# Patient Record
Sex: Female | Born: 1960 | Race: White | Hispanic: No | Smoking: Former smoker
Health system: Southern US, Community
[De-identification: ages and names within clinical notes are randomized; demographics above are authoritative.]

## PROBLEM LIST (undated history)

## (undated) DIAGNOSIS — E119 Type 2 diabetes mellitus without complications: Secondary | ICD-10-CM

## (undated) DIAGNOSIS — Z8489 Family history of other specified conditions: Secondary | ICD-10-CM

## (undated) DIAGNOSIS — F329 Major depressive disorder, single episode, unspecified: Secondary | ICD-10-CM

## (undated) DIAGNOSIS — N189 Chronic kidney disease, unspecified: Secondary | ICD-10-CM

## (undated) DIAGNOSIS — F32A Depression, unspecified: Secondary | ICD-10-CM

## (undated) DIAGNOSIS — E785 Hyperlipidemia, unspecified: Secondary | ICD-10-CM

## (undated) DIAGNOSIS — R112 Nausea with vomiting, unspecified: Secondary | ICD-10-CM

## (undated) DIAGNOSIS — F419 Anxiety disorder, unspecified: Secondary | ICD-10-CM

## (undated) DIAGNOSIS — K3184 Gastroparesis: Secondary | ICD-10-CM

## (undated) DIAGNOSIS — Z9889 Other specified postprocedural states: Secondary | ICD-10-CM

## (undated) DIAGNOSIS — K219 Gastro-esophageal reflux disease without esophagitis: Secondary | ICD-10-CM

## (undated) DIAGNOSIS — J189 Pneumonia, unspecified organism: Secondary | ICD-10-CM

## (undated) HISTORY — PX: TONSILLECTOMY: SUR1361

## (undated) HISTORY — DX: Gastro-esophageal reflux disease without esophagitis: K21.9

## (undated) HISTORY — PX: TYMPANOSTOMY TUBE PLACEMENT: SHX32

## (undated) HISTORY — PX: ADENOIDECTOMY: SUR15

## (undated) HISTORY — PX: TUBAL LIGATION: SHX77

## (undated) HISTORY — PX: CHOLECYSTECTOMY: SHX55

---

## 2007-07-01 ENCOUNTER — Other Ambulatory Visit: Admission: RE | Admit: 2007-07-01 | Discharge: 2007-07-01 | Payer: Self-pay | Admitting: Obstetrics and Gynecology

## 2007-07-05 ENCOUNTER — Ambulatory Visit (HOSPITAL_COMMUNITY): Admission: RE | Admit: 2007-07-05 | Discharge: 2007-07-05 | Payer: Self-pay | Admitting: Obstetrics and Gynecology

## 2007-07-26 ENCOUNTER — Other Ambulatory Visit: Admission: RE | Admit: 2007-07-26 | Discharge: 2007-07-26 | Payer: Self-pay | Admitting: Obstetrics & Gynecology

## 2008-10-25 ENCOUNTER — Other Ambulatory Visit: Admission: RE | Admit: 2008-10-25 | Discharge: 2008-10-25 | Payer: Self-pay | Admitting: Obstetrics and Gynecology

## 2009-02-05 ENCOUNTER — Ambulatory Visit (HOSPITAL_COMMUNITY): Admission: RE | Admit: 2009-02-05 | Discharge: 2009-02-05 | Payer: Self-pay | Admitting: Family Medicine

## 2012-03-03 ENCOUNTER — Other Ambulatory Visit (HOSPITAL_COMMUNITY)
Admission: RE | Admit: 2012-03-03 | Discharge: 2012-03-03 | Disposition: A | Discharge status: Home / Self Care | Payer: PRIVATE HEALTH INSURANCE | Source: Ambulatory Visit | Attending: Obstetrics and Gynecology | Admitting: Obstetrics and Gynecology

## 2012-03-03 DIAGNOSIS — Z01419 Encounter for gynecological examination (general) (routine) without abnormal findings: Secondary | ICD-10-CM | POA: Insufficient documentation

## 2012-03-03 DIAGNOSIS — Z113 Encounter for screening for infections with a predominantly sexual mode of transmission: Secondary | ICD-10-CM | POA: Insufficient documentation

## 2012-03-03 DIAGNOSIS — Z1159 Encounter for screening for other viral diseases: Secondary | ICD-10-CM | POA: Insufficient documentation

## 2012-03-05 ENCOUNTER — Other Ambulatory Visit: Payer: Self-pay | Admitting: Adult Health

## 2012-03-05 DIAGNOSIS — Z139 Encounter for screening, unspecified: Secondary | ICD-10-CM

## 2012-03-08 ENCOUNTER — Ambulatory Visit (HOSPITAL_COMMUNITY)
Admission: RE | Admit: 2012-03-08 | Discharge: 2012-03-08 | Disposition: A | Discharge status: Home / Self Care | Payer: PRIVATE HEALTH INSURANCE | Source: Ambulatory Visit | Attending: Adult Health | Admitting: Adult Health

## 2012-03-08 DIAGNOSIS — Z139 Encounter for screening, unspecified: Secondary | ICD-10-CM

## 2012-03-08 DIAGNOSIS — Z1231 Encounter for screening mammogram for malignant neoplasm of breast: Secondary | ICD-10-CM | POA: Insufficient documentation

## 2013-01-10 ENCOUNTER — Other Ambulatory Visit: Payer: Self-pay | Admitting: Adult Health

## 2013-03-17 ENCOUNTER — Other Ambulatory Visit: Payer: Self-pay | Admitting: Adult Health

## 2013-04-10 ENCOUNTER — Other Ambulatory Visit: Payer: Self-pay | Admitting: Obstetrics and Gynecology

## 2013-05-24 ENCOUNTER — Other Ambulatory Visit: Payer: Self-pay | Admitting: Adult Health

## 2013-07-19 ENCOUNTER — Other Ambulatory Visit: Payer: Self-pay | Admitting: Adult Health

## 2013-09-19 ENCOUNTER — Other Ambulatory Visit: Payer: Self-pay | Admitting: Adult Health

## 2015-08-08 ENCOUNTER — Ambulatory Visit (HOSPITAL_COMMUNITY)
Admission: RE | Admit: 2015-08-08 | Discharge: 2015-08-08 | Disposition: A | Discharge status: Home / Self Care | Payer: PRIVATE HEALTH INSURANCE | Source: Ambulatory Visit | Attending: Family Medicine | Admitting: Family Medicine

## 2015-08-08 ENCOUNTER — Other Ambulatory Visit (HOSPITAL_COMMUNITY): Payer: Self-pay | Admitting: Family Medicine

## 2015-08-08 DIAGNOSIS — M79641 Pain in right hand: Secondary | ICD-10-CM | POA: Diagnosis not present

## 2016-09-29 ENCOUNTER — Other Ambulatory Visit (HOSPITAL_COMMUNITY): Payer: Self-pay | Admitting: Internal Medicine

## 2016-09-29 ENCOUNTER — Ambulatory Visit (HOSPITAL_COMMUNITY)
Admission: RE | Admit: 2016-09-29 | Discharge: 2016-09-29 | Disposition: A | Discharge status: Home / Self Care | Payer: PRIVATE HEALTH INSURANCE | Source: Ambulatory Visit | Attending: Internal Medicine | Admitting: Internal Medicine

## 2016-09-29 DIAGNOSIS — R0602 Shortness of breath: Secondary | ICD-10-CM

## 2016-09-29 DIAGNOSIS — R05 Cough: Secondary | ICD-10-CM | POA: Insufficient documentation

## 2016-09-29 DIAGNOSIS — R918 Other nonspecific abnormal finding of lung field: Secondary | ICD-10-CM | POA: Diagnosis not present

## 2016-09-29 DIAGNOSIS — R059 Cough, unspecified: Secondary | ICD-10-CM

## 2016-10-01 ENCOUNTER — Observation Stay (HOSPITAL_COMMUNITY)
Admission: EM | Admit: 2016-10-01 | Discharge: 2016-10-02 | Disposition: A | Discharge status: Home / Self Care | Payer: PRIVATE HEALTH INSURANCE | Attending: Internal Medicine | Admitting: Internal Medicine

## 2016-10-01 ENCOUNTER — Emergency Department (HOSPITAL_COMMUNITY): Payer: PRIVATE HEALTH INSURANCE

## 2016-10-01 ENCOUNTER — Encounter (HOSPITAL_COMMUNITY): Payer: Self-pay | Admitting: *Deleted

## 2016-10-01 DIAGNOSIS — E876 Hypokalemia: Secondary | ICD-10-CM | POA: Diagnosis not present

## 2016-10-01 DIAGNOSIS — J189 Pneumonia, unspecified organism: Principal | ICD-10-CM | POA: Insufficient documentation

## 2016-10-01 DIAGNOSIS — F17200 Nicotine dependence, unspecified, uncomplicated: Secondary | ICD-10-CM | POA: Insufficient documentation

## 2016-10-01 DIAGNOSIS — Z79899 Other long term (current) drug therapy: Secondary | ICD-10-CM | POA: Insufficient documentation

## 2016-10-01 DIAGNOSIS — I10 Essential (primary) hypertension: Secondary | ICD-10-CM | POA: Insufficient documentation

## 2016-10-01 DIAGNOSIS — R0602 Shortness of breath: Secondary | ICD-10-CM | POA: Diagnosis present

## 2016-10-01 DIAGNOSIS — Z7984 Long term (current) use of oral hypoglycemic drugs: Secondary | ICD-10-CM | POA: Insufficient documentation

## 2016-10-01 DIAGNOSIS — J9601 Acute respiratory failure with hypoxia: Secondary | ICD-10-CM | POA: Diagnosis not present

## 2016-10-01 DIAGNOSIS — J111 Influenza due to unidentified influenza virus with other respiratory manifestations: Secondary | ICD-10-CM | POA: Diagnosis present

## 2016-10-01 DIAGNOSIS — J181 Lobar pneumonia, unspecified organism: Secondary | ICD-10-CM | POA: Diagnosis not present

## 2016-10-01 HISTORY — DX: Anxiety disorder, unspecified: F41.9

## 2016-10-01 HISTORY — DX: Hyperlipidemia, unspecified: E78.5

## 2016-10-01 LAB — CBC WITH DIFFERENTIAL/PLATELET
BASOS ABS: 0 10*3/uL (ref 0.0–0.1)
Basophils Relative: 0 %
EOS PCT: 1 %
Eosinophils Absolute: 0.1 10*3/uL (ref 0.0–0.7)
HEMATOCRIT: 34.9 % — AB (ref 36.0–46.0)
HEMOGLOBIN: 11.7 g/dL — AB (ref 12.0–15.0)
LYMPHS ABS: 1.7 10*3/uL (ref 0.7–4.0)
LYMPHS PCT: 26 %
MCH: 29.2 pg (ref 26.0–34.0)
MCHC: 33.5 g/dL (ref 30.0–36.0)
MCV: 87 fL (ref 78.0–100.0)
Monocytes Absolute: 0.3 10*3/uL (ref 0.1–1.0)
Monocytes Relative: 5 %
NEUTROS ABS: 4.5 10*3/uL (ref 1.7–7.7)
NEUTROS PCT: 68 %
PLATELETS: 208 10*3/uL (ref 150–400)
RBC: 4.01 MIL/uL (ref 3.87–5.11)
RDW: 13 % (ref 11.5–15.5)
WBC: 6.6 10*3/uL (ref 4.0–10.5)

## 2016-10-01 LAB — COMPREHENSIVE METABOLIC PANEL
ALK PHOS: 112 U/L (ref 38–126)
ALT: 31 U/L (ref 14–54)
AST: 41 U/L (ref 15–41)
Albumin: 3.3 g/dL — ABNORMAL LOW (ref 3.5–5.0)
Anion gap: 11 (ref 5–15)
BUN: 9 mg/dL (ref 6–20)
CALCIUM: 8.6 mg/dL — AB (ref 8.9–10.3)
CHLORIDE: 95 mmol/L — AB (ref 101–111)
CO2: 30 mmol/L (ref 22–32)
CREATININE: 0.55 mg/dL (ref 0.44–1.00)
GFR calc Af Amer: >60 mL/min (ref >60)
Glucose, Bld: 247 mg/dL — ABNORMAL HIGH (ref 65–99)
Potassium: 3.1 mmol/L — ABNORMAL LOW (ref 3.5–5.1)
Sodium: 136 mmol/L (ref 135–145)
Total Bilirubin: 0.5 mg/dL (ref 0.3–1.2)
Total Protein: 7 g/dL (ref 6.5–8.1)

## 2016-10-01 LAB — BRAIN NATRIURETIC PEPTIDE: B Natriuretic Peptide: 71 pg/mL (ref 0.0–100.0)

## 2016-10-01 LAB — INFLUENZA PANEL BY PCR (TYPE A & B)
INFLAPCR: POSITIVE — AB
INFLBPCR: NEGATIVE

## 2016-10-01 LAB — GLUCOSE, CAPILLARY: Glucose-Capillary: 309 mg/dL — ABNORMAL HIGH (ref 65–99)

## 2016-10-01 LAB — TROPONIN I

## 2016-10-01 MED ORDER — ALBUTEROL SULFATE (2.5 MG/3ML) 0.083% IN NEBU
5.0000 mg | INHALATION_SOLUTION | Freq: Once | RESPIRATORY_TRACT | Status: AC
  Administered 2016-10-01: 5 mg via RESPIRATORY_TRACT

## 2016-10-01 MED ORDER — LEVOFLOXACIN IN D5W 500 MG/100ML IV SOLN: 500.0000 mg | Freq: Once | INTRAVENOUS | Status: DC

## 2016-10-01 MED ORDER — LEVOFLOXACIN IN D5W 750 MG/150ML IV SOLN
750.0000 mg | INTRAVENOUS | Status: DC
  Administered 2016-10-01: 750 mg via INTRAVENOUS
  Filled 2016-10-01: qty 150

## 2016-10-01 MED ORDER — ALBUTEROL SULFATE (2.5 MG/3ML) 0.083% IN NEBU
2.5000 mg | INHALATION_SOLUTION | Freq: Once | RESPIRATORY_TRACT | Status: DC
  Filled 2016-10-01: qty 3

## 2016-10-01 MED ORDER — INSULIN ASPART 100 UNIT/ML ~~LOC~~ SOLN
0.0000 [IU] | Freq: Three times a day (TID) | SUBCUTANEOUS | Status: DC
  Administered 2016-10-02: 5 [IU] via SUBCUTANEOUS

## 2016-10-01 MED ORDER — ONDANSETRON HCL 4 MG/2ML IJ SOLN: 4.0000 mg | Freq: Four times a day (QID) | INTRAMUSCULAR | Status: DC | PRN

## 2016-10-01 MED ORDER — OSELTAMIVIR PHOSPHATE 75 MG PO CAPS
75.0000 mg | ORAL_CAPSULE | Freq: Two times a day (BID) | ORAL | Status: DC
  Administered 2016-10-01 – 2016-10-02 (×3): 75 mg via ORAL
  Filled 2016-10-01 (×3): qty 1

## 2016-10-01 MED ORDER — SODIUM CHLORIDE 0.9 % IV BOLUS (SEPSIS): 1000.0000 mL | Freq: Once | INTRAVENOUS | Status: DC

## 2016-10-01 MED ORDER — ONDANSETRON HCL 4 MG PO TABS
4.0000 mg | ORAL_TABLET | Freq: Four times a day (QID) | ORAL | Status: DC | PRN
Start: 2016-10-01 — End: 2016-10-02

## 2016-10-01 MED ORDER — LISINOPRIL 5 MG PO TABS
2.5000 mg | ORAL_TABLET | Freq: Every day | ORAL | Status: DC
  Administered 2016-10-01 – 2016-10-02 (×2): 2.5 mg via ORAL
  Filled 2016-10-01 (×2): qty 1

## 2016-10-01 MED ORDER — ALBUTEROL SULFATE (2.5 MG/3ML) 0.083% IN NEBU: 2.5000 mg | INHALATION_SOLUTION | RESPIRATORY_TRACT | Status: DC | PRN

## 2016-10-01 MED ORDER — POTASSIUM CHLORIDE CRYS ER 20 MEQ PO TBCR
40.0000 meq | EXTENDED_RELEASE_TABLET | Freq: Once | ORAL | Status: AC
  Administered 2016-10-01: 40 meq via ORAL
  Filled 2016-10-01: qty 2

## 2016-10-01 MED ORDER — IPRATROPIUM-ALBUTEROL 0.5-2.5 (3) MG/3ML IN SOLN
3.0000 mL | Freq: Once | RESPIRATORY_TRACT | Status: AC
  Administered 2016-10-01: 3 mL via RESPIRATORY_TRACT
  Filled 2016-10-01: qty 3

## 2016-10-01 MED ORDER — POTASSIUM CHLORIDE CRYS ER 20 MEQ PO TBCR
40.0000 meq | EXTENDED_RELEASE_TABLET | Freq: Four times a day (QID) | ORAL | Status: DC
  Filled 2016-10-01: qty 2

## 2016-10-01 MED ORDER — INSULIN ASPART 100 UNIT/ML ~~LOC~~ SOLN
0.0000 [IU] | Freq: Every day | SUBCUTANEOUS | Status: DC
  Administered 2016-10-01: 4 [IU] via SUBCUTANEOUS

## 2016-10-01 MED ORDER — GLIMEPIRIDE 2 MG PO TABS
4.0000 mg | ORAL_TABLET | Freq: Every day | ORAL | Status: DC
  Administered 2016-10-02: 4 mg via ORAL
  Filled 2016-10-01: qty 2

## 2016-10-01 MED ORDER — IBUPROFEN 400 MG PO TABS: 400.0000 mg | ORAL_TABLET | Freq: Four times a day (QID) | ORAL | Status: DC | PRN

## 2016-10-01 MED ORDER — ENOXAPARIN SODIUM 60 MG/0.6ML ~~LOC~~ SOLN
60.0000 mg | SUBCUTANEOUS | Status: DC
  Filled 2016-10-01: qty 0.6

## 2016-10-01 MED ORDER — POTASSIUM CHLORIDE IN NACL 20-0.9 MEQ/L-% IV SOLN
INTRAVENOUS | Status: DC
  Administered 2016-10-01 – 2016-10-02 (×2): via INTRAVENOUS

## 2016-10-01 MED ORDER — HYDROCODONE-ACETAMINOPHEN 5-325 MG PO TABS: 1.0000 | ORAL_TABLET | ORAL | Status: DC | PRN

## 2016-10-01 MED ORDER — FLUOXETINE HCL 20 MG PO CAPS
20.0000 mg | ORAL_CAPSULE | Freq: Every day | ORAL | Status: DC
  Administered 2016-10-01 – 2016-10-02 (×2): 20 mg via ORAL
  Filled 2016-10-01 (×2): qty 1

## 2016-10-01 MED ORDER — ATORVASTATIN CALCIUM 10 MG PO TABS
10.0000 mg | ORAL_TABLET | Freq: Every day | ORAL | Status: DC
  Administered 2016-10-01: 10 mg via ORAL
  Filled 2016-10-01: qty 1

## 2016-10-01 NOTE — ED Notes (Signed)
Pt taken off oxygen to assess. Decrease in Oxygen to 88%

## 2016-10-01 NOTE — ED Provider Notes (Signed)
[facility] DEPT Provider Note   CSN: [id number] Arrival date & time: 10/01/16  1120   By signing my name below, I, Jeanette Aguirre, attest that this documentation has been prepared under the direction and in the presence of Milton Ferguson, MD. Electronically Signed: Collene Aguirre, Scribe. 10/01/16. 12:56 PM.  History   Chief Complaint Chief Complaint  Patient presents with  . Shortness of Breath    HPI Comments: Jeanette Aguirre is a 56 y.o. female who presents to the Emergency Department complaining of a intermittent  cough that began 6 days ago. Patient has associated fever, chills, and shortness of breath. Patient states she went to her PCP  3 days ago and had a breathing treatment. Patients chest x-ray showed pneumonia and she was referred to the hospital. Patient is a chronic tobacco user, one pack a day. She denies any vomiting.   The history is provided by the patient. No language interpreter was used.  Shortness of Breath  This is a new problem. The problem occurs rarely.The current episode started more than 2 days ago. The problem has not changed since onset.Associated symptoms include a fever and cough. Pertinent negatives include no headaches, no chest pain, no vomiting, no abdominal pain and no rash. Risk factors include smoking.    Past Medical History:  Diagnosis Date  . Anxiety   . Hyperlipemia   . Hypertension     There are no active problems to display for this patient.   Past Surgical History:  Procedure Laterality Date  . CHOLECYSTECTOMY    . TONSILLECTOMY    . TYMPANOSTOMY TUBE PLACEMENT      OB History    No data available       Home Medications    Prior to Admission medications   Medication Sig Start Date End Date Taking? Authorizing Provider  FLUoxetine (PROZAC) 20 MG capsule TAKE ONE CAPSULE BY MOUTH EVERY DAY 04/10/13   Jonnie Kind, MD  megestrol (MEGACE) 40 MG tablet TAKE ONE TABLET BY MOUTH ONCE DAILY 07/19/13   Estill Dooms, NP     Family History No family history on file.  Social History Social History  Substance Use Topics  . Smoking status: Current Some Day Smoker  . Smokeless tobacco: Never Used  . Alcohol use Yes     Comment: occ.     Allergies   Patient has no known allergies.   Review of Systems Review of Systems  Constitutional: Positive for fever. Negative for appetite change and fatigue.  HENT: Negative for congestion, ear discharge and sinus pressure.   Eyes: Negative for discharge.  Respiratory: Positive for cough and shortness of breath.   Cardiovascular: Negative for chest pain.  Gastrointestinal: Negative for abdominal pain, diarrhea and vomiting.  Genitourinary: Negative for frequency and hematuria.  Musculoskeletal: Negative for back pain.  Skin: Negative for rash.  Neurological: Negative for seizures and headaches.  Psychiatric/Behavioral: Negative for hallucinations.     Physical Exam Updated Vital Signs BP 116/64 (BP Location: Right Arm)   Pulse 73   Temp 99.5 F (37.5 C) (Oral)   Resp 20   Ht 5\' 6"  (1.676 m)   Wt 256 lb (116.1 kg)   LMP 03/03/2012   SpO2 (S) 94% Comment: Pt was 88% on RA.  BMI 41.32 kg/m   Physical Exam  Constitutional: She is oriented to person, place, and time. She appears well-developed.  HENT:  Head: Normocephalic.  Eyes: Conjunctivae and EOM are normal. No scleral icterus.  Neck:  Neck supple. No thyromegaly present.  Cardiovascular: Normal rate and regular rhythm.  Exam reveals no gallop and no friction rub.   No murmur heard. Moderate wheezing bilaterally.   Pulmonary/Chest: No stridor. She has wheezes. She has no rales. She exhibits no tenderness.  Abdominal: She exhibits no distension. There is no tenderness. There is no rebound.  Musculoskeletal: Normal range of motion. She exhibits no edema.  Lymphadenopathy:    She has no cervical adenopathy.  Neurological: She is oriented to person, place, and time. She exhibits normal muscle  tone. Coordination normal.  Skin: No rash noted. No erythema.  Psychiatric: She has a normal mood and affect. Her behavior is normal.     ED Treatments / Results  DIAGNOSTIC STUDIES: Oxygen Saturation is 88% on RA, low by my interpretation.    COORDINATION OF CARE: 12:01 PM Discussed treatment plan with pt at bedside and pt agreed to plan.  Labs (all labs ordered are listed, but only abnormal results are displayed) Labs Reviewed - No data to display  EKG  EKG Interpretation None       Radiology No results found.  Procedures Procedures (including critical care time)  Medications Ordered in ED Medications  albuterol (PROVENTIL) (2.5 MG/3ML) 0.083% nebulizer solution 5 mg (not administered)     Initial Impression / Assessment and Plan / ED Course  I have reviewed the triage vital signs and the nursing notes.  Pertinent labs & imaging results that were available during my care of the patient were reviewed by me and considered in my medical decision making (see chart for details).  Clinical Course     Patient with pneumonia. She'll be admitted to medicine  Final Clinical Impressions(s) / ED Diagnoses   Final diagnoses:  None    New Prescriptions New Prescriptions   No medications on file   The chart was scribed for me under my direct supervision.  I personally performed the history, physical, and medical decision making and all procedures in the evaluation of this patient.Milton Ferguson, MD 10/01/16 [phone]

## 2016-10-01 NOTE — ED Triage Notes (Signed)
Pt started having cough and congestion since last Friday. Pt went to her PCP on Monday and had a breathing tx and chest xray which showed pneumonia. Pt was discharged with cough medication and two antibiotics. Pt states she had a follow up today and continued to have shortness of breath with a oxygen of 83%. Pt was given another breathing tx. NAD noted in triage.

## 2016-10-01 NOTE — H&P (Signed)
[facility] H&P   Patient Demographics:    Jeanette Aguirre, is a 56 y.o. female  MRN: [id number]   DOB - 05-Oct-1960  Admit Date - 10/01/2016  Outpatient Primary MD for the patient is Purvis Kilts, MD Patient coming from: Home  Chief Complaint  Patient presents with  . Shortness of Breath      HPI:    Jeanette Aguirre  is a 56 y.o. female, DM type II, hypertension, ongoing smoking, obesity comes to the hospital with three-day history of dry cough, shortness of breath and fevers, did not take a flu shot this year, denies any exposure to sick contacts. In the ER has with pneumonia with infiltrate on x-ray, she was febrile but without any white count, she was found to be hypoxic requiring oxygen and I was called to admit.  He should currently denies any headache, low intermittent fevers at home, no chest pain, positive dry cough and shortness of breath, some abdominal pain from constant coughing, no diarrhea, no blood in stool or urine, no swelling or edema in legs, no focal weakness.    Review of systems:    In addition to the HPI above,   No Fever-chills, No Headache, No changes with Vision or hearing, No problems swallowing food or Liquids, No Chest pain, As above Cough & Shortness of Breath, No Abdominal pain, No Nausea or Vommitting, Bowel movements are regular, No Blood in stool or Urine, No dysuria, No new skin rashes or bruises, No new joints pains-aches,  No new weakness, tingling, numbness in any extremity, No recent weight gain or loss, No polyuria, polydypsia or polyphagia, No significant Mental Stressors.  A full 10 point Review of Systems was done, except as stated above, all other Review of Systems were  negative.   With Past History of the following :    Past Medical History:  Diagnosis Date  . Anxiety   . Hyperlipemia   . Hypertension       Past Surgical History:  Procedure Laterality Date  . CHOLECYSTECTOMY    . TONSILLECTOMY    . TYMPANOSTOMY TUBE PLACEMENT        Social History:     Social History  Substance Use Topics  . Smoking status: Current Some Day Smoker  . Smokeless tobacco: Never Used  . Alcohol use Yes  Comment: occ.         Family History :   Abely history positive for DM type II and patient's parents   Home Medications:   Prior to Admission medications   Medication Sig Start Date End Date Taking? Authorizing Provider  acetaminophen (TYLENOL) 500 MG tablet Take 1,000 mg by mouth every 6 (six) hours as needed.   Yes Historical Provider, MD  atorvastatin (LIPITOR) 10 MG tablet Take 10 mg by mouth at bedtime.   Yes Historical Provider, MD  azithromycin (ZITHROMAX) 250 MG tablet Take 250 mg by mouth as directed.   Yes Historical Provider, MD  benzonatate (TESSALON) 100 MG capsule Take 100 mg by mouth 3 (three) times daily as needed for cough.   Yes Historical Provider, MD  Chlorphen-Phenyleph-ASA (ALKA-SELTZER PLUS COLD) 2-7.8-325 MG TBEF Take 1 tablet by mouth daily as needed (cold symptoms).   Yes Historical Provider, MD  doxycycline (VIBRAMYCIN) 100 MG capsule Take 100 mg by mouth 2 (two) times daily. For 10 days 09/29/16  Yes Historical Provider, MD  FLUoxetine (PROZAC) 20 MG capsule TAKE ONE CAPSULE BY MOUTH EVERY DAY 04/10/13  Yes Jonnie Kind, MD  glimepiride (AMARYL) 4 MG tablet Take 4 mg by mouth daily with breakfast.   Yes Historical Provider, MD  ibuprofen (ADVIL,MOTRIN) 200 MG tablet Take 400 mg by mouth every 6 (six) hours as needed.   Yes Historical Provider, MD  lisinopril (PRINIVIL,ZESTRIL) 2.5 MG tablet Take 2.5 mg by mouth daily.   Yes Historical Provider, MD  naproxen sodium (ANAPROX) 220 MG tablet Take 220 mg by mouth 2 (two)  times daily with a meal.   Yes Historical Provider, MD  megestrol (MEGACE) 40 MG tablet TAKE ONE TABLET BY MOUTH ONCE DAILY Patient not taking: Reported on 10/01/2016 07/19/13   Estill Dooms, NP     Allergies:    No Known Allergies   Physical Exam:   Vitals  Blood pressure 127/58, pulse 79, temperature 99.5 F (37.5 C), temperature source Oral, resp. rate 18, height 5\' 6"  (1.676 m), weight 116.1 kg (256 lb), last menstrual period 03/03/2012, SpO2 91 %.   1. General Middle-aged obese white female sitting in bed in NAD,     2. Normal affect and insight, Not Suicidal or Homicidal, Awake Alert, Oriented X 3.  3. No F.N deficits, ALL C.Nerves Intact, Strength 5/5 all 4 extremities, Sensation intact all 4 extremities, Plantars down going.  4. Ears and Eyes appear Normal, Conjunctivae clear, PERRLA. Moist Oral Mucosa.  5. Supple Neck, No JVD, No cervical lymphadenopathy appriciated, No Carotid Bruits.  6. Symmetrical Chest Faddis movement, Good air movement bilaterally, few bilateral rales  7. RRR, No Gallops, Rubs or Murmurs, No Parasternal Heave.  8. Positive Bowel Sounds, Abdomen Soft, No tenderness, No organomegaly appriciated,No rebound -guarding or rigidity.  9.  No Cyanosis, Normal Skin Turgor, No Skin Rash or Bruise.  10. Good muscle tone,  joints appear normal , no effusions, Normal ROM.  11. No Palpable Lymph Nodes in Neck or Axillae      Data Review:    CBC  Recent Labs Lab 10/01/16 1224  WBC 6.6  HGB 11.7*  HCT 34.9*  PLT 208  MCV 87.0  MCH 29.2  MCHC 33.5  RDW 13.0  LYMPHSABS 1.7  MONOABS 0.3  EOSABS 0.1  BASOSABS 0.0   ------------------------------------------------------------------------------------------------------------------  Chemistries   Recent Labs Lab 10/01/16 1224  NA 136  K 3.1*  CL 95*  CO2 30  GLUCOSE 247*  BUN 9  CREATININE 0.55  CALCIUM 8.6*  AST 41  ALT 31  ALKPHOS 112  BILITOT 0.5    ------------------------------------------------------------------------------------------------------------------ estimated creatinine clearance is 105.3 mL/min (by C-G formula based on SCr of 0.55 mg/dL). ------------------------------------------------------------------------------------------------------------------ No results for input(s): TSH, T4TOTAL, T3FREE, THYROIDAB in the last 72 hours.  Invalid input(s): FREET3  Coagulation profile No results for input(s): INR, PROTIME in the last 168 hours. ------------------------------------------------------------------------------------------------------------------- No results for input(s): DDIMER in the last 72 hours. -------------------------------------------------------------------------------------------------------------------  Cardiac Enzymes  Recent Labs Lab 10/01/16 1224  TROPONINI <0.03   ------------------------------------------------------------------------------------------------------------------    Component Value Date/Time   BNP 71.0 10/01/2016 1224     ---------------------------------------------------------------------------------------------------------------  Urinalysis No results found for: COLORURINE, APPEARANCEUR, LABSPEC, Gordon, GLUCOSEU, HGBUR, BILIRUBINUR, KETONESUR, PROTEINUR, UROBILINOGEN, NITRITE, LEUKOCYTESUR  ----------------------------------------------------------------------------------------------------------------   Imaging Results:    Dg Chest 2 View  Result Date: 10/01/2016 CLINICAL DATA:  Shortness of breath since this past Monday with increasing severity. The patient reports a cough for the past 5 days. Current smoker. History of hypertension. EXAM: CHEST  2 VIEW COMPARISON:  PA and lateral chest x-ray of September 29, 2016 FINDINGS: The lungs are well-expanded. The interstitial markings remain increased. Confluent density has developed medially in the left upper lobe. The cardiac  silhouette is normal in size. The pulmonary vascularity is not engorged. There is calcification in the Ratcliffe of the aortic arch. There is no pleural effusion. The bony thorax exhibits no acute abnormality. IMPRESSION: Chronic bronchitic changes with superimposed acute interstitial pneumonia (or less likely edema) with an area of alveolar pneumonia developing in the right upper lobe. Thoracic aortic atherosclerosis. Electronically Signed   By: David  Martinique M.D.   On: 10/01/2016 12:38    My personal review of EKG: Rhythm NSR,  no Acute ST changes   Assessment & Plan:     1. Acute hypoxic respiratory failure due to CAP - suspicious for influenza, we will check influenza PCR for now place her on Tamiflu along with Levaquin, oxygen and nebulizer treatments as needed. We'll reevaluate oxygen requirement in the morning.  2. Smoking. Counseled to quit.  3. Hypertension. Continue home dose ACE inhibitor.  4. Hypokalemia. Replaced.   5. DM type II. Check A1c, continue Amaryl, add sliding scale.     DVT Prophylaxis   Lovenox    AM Labs Ordered, also please review Full Orders  Family Communication: Admission, patients condition and plan of care including tests being ordered have been discussed with the patient  who indicates understanding and agree with the plan and Code Status.  Code Status Full  Likely DC to  Home 2-3 days  Condition Fair  Consults called: None    Admission status: Inpt    Time spent in minutes : 35   Javone Ybanez K M.D on 10/01/2016 at 2:54 PM  Between 7am to 7pm - Pager - [phone]. After 7pm go to [email or web address] - password [facility]  [facility] - Office  [phone]

## 2016-10-01 NOTE — Progress Notes (Signed)
Pharmacy Antibiotic Note  LORRA DOWNING is a 56 y.o. female admitted on 10/01/2016 with CAP.  Pharmacy has been consulted for [facility] dosing.  Plan: Levaquin 750mg  IV q24hrs x 5 days, switch to PO when improved Monitor labs, progress, c/s  Height: 5\' 6"  (167.6 cm) Weight: 256 lb (116.1 kg) IBW/kg (Calculated) : 59.3  Temp (24hrs), Avg:99.5 F (37.5 C), Min:99.5 F (37.5 C), Max:99.5 F (37.5 C)   Recent Labs Lab 10/01/16 1224  WBC 6.6  CREATININE 0.55    Estimated Creatinine Clearance: 105.3 mL/min (by C-G formula based on SCr of 0.55 mg/dL).    No Known Allergies  Antimicrobials this admission: Levaquin 1/10 >>   Dose adjustments this admission: n/a  Thank you for allowing pharmacy to be a part of this patient's care.  Hart Robinsons A 10/01/2016 2:20 PM

## 2016-10-01 NOTE — ED Notes (Signed)
RT paged about breathing tx. 

## 2016-10-02 DIAGNOSIS — J181 Lobar pneumonia, unspecified organism: Secondary | ICD-10-CM | POA: Diagnosis not present

## 2016-10-02 DIAGNOSIS — J111 Influenza due to unidentified influenza virus with other respiratory manifestations: Secondary | ICD-10-CM | POA: Diagnosis present

## 2016-10-02 LAB — BASIC METABOLIC PANEL
ANION GAP: 8 (ref 5–15)
BUN: 9 mg/dL (ref 6–20)
CALCIUM: 8.3 mg/dL — AB (ref 8.9–10.3)
CO2: 31 mmol/L (ref 22–32)
CREATININE: 0.48 mg/dL (ref 0.44–1.00)
Chloride: 99 mmol/L — ABNORMAL LOW (ref 101–111)
GFR calc Af Amer: >60 mL/min (ref >60)
Glucose, Bld: 245 mg/dL — ABNORMAL HIGH (ref 65–99)
Potassium: 3.7 mmol/L (ref 3.5–5.1)
Sodium: 138 mmol/L (ref 135–145)

## 2016-10-02 LAB — CBC
HCT: 34.1 % — ABNORMAL LOW (ref 36.0–46.0)
HEMOGLOBIN: 11.2 g/dL — AB (ref 12.0–15.0)
MCH: 28.9 pg (ref 26.0–34.0)
MCHC: 32.8 g/dL (ref 30.0–36.0)
MCV: 87.9 fL (ref 78.0–100.0)
Platelets: 196 10*3/uL (ref 150–400)
RBC: 3.88 MIL/uL (ref 3.87–5.11)
RDW: 13.3 % (ref 11.5–15.5)
WBC: 5.8 10*3/uL (ref 4.0–10.5)

## 2016-10-02 LAB — GLUCOSE, CAPILLARY: GLUCOSE-CAPILLARY: 291 mg/dL — AB (ref 65–99)

## 2016-10-02 LAB — HEMOGLOBIN A1C
Hgb A1c MFr Bld: 9.5 % — ABNORMAL HIGH (ref 4.8–5.6)
Mean Plasma Glucose: 226 mg/dL

## 2016-10-02 MED ORDER — OSELTAMIVIR PHOSPHATE 75 MG PO CAPS: 75.0000 mg | ORAL_CAPSULE | Freq: Two times a day (BID) | ORAL | 0 refills | Status: DC

## 2016-10-02 MED ORDER — DM-GUAIFENESIN ER 30-600 MG PO TB12: 1.0000 | ORAL_TABLET | Freq: Two times a day (BID) | ORAL | 0 refills | Status: DC

## 2016-10-02 NOTE — Discharge Summary (Addendum)
Jeanette Aguirre [id number] DOB: 10-08-1960 DOA: 10/01/2016  PCP: Purvis Kilts, MD  Admit date: 10/01/2016  Discharge date: 10/02/2016  Admitted From: Home  Disposition:  Home   Recommendations for Outpatient Follow-up:   Follow up with PCP in 1-2 weeks  PCP Please obtain BMP/CBC, 2 view CXR in 1week,  (see Discharge instructions)   PCP Please follow up on the following pending results: A1c   Home Health: None   Equipment/Devices: o2  Consultations: None Discharge Condition: Stable   CODE STATUS: Full   Diet Recommendation: Diet heart healthy/carb modified    Chief Complaint  Patient presents with  . Shortness of Breath     Brief history of present illness from the day of admission and additional interim summary     Jeanette Aguirre  is a 56 y.o. female, DM type II, hypertension, ongoing smoking, obesity comes to the hospital with three-day history of dry cough, shortness of breath and fevers, did not take a flu shot this year, denies any exposure to sick contacts. In the ER has with pneumonia with infiltrate on x-ray, she was febrile but without any white count, she was found to be hypoxic requiring oxygen and I was called to admit.  He should currently denies any headache, low intermittent fevers at home, no chest pain, positive dry cough and shortness of breath, some abdominal pain from constant coughing, no diarrhea, no blood in stool or urine, no swelling or edema in legs, no focal weakness.  Hospital issues addressed    1. Acute hypoxic respiratory failure due to Flu induced CAP - Feels better, started on Tamiflu, still mildly hypoxic upon ambulation and will get oxygen 2 L nasal cannula for now, complete Tamiflu course, follow with PCP in a week likely can come off of oxygen soon. No leukocytosis do not  think she has a bacterial component to her infection.  2. Smoking. Counseled to quit.  3. Hypertension. Continue home dose ACE inhibitor.  4. Hypokalemia. Replaced and stable.   5. DM type II. Continue home dose Amaryl, A1c is pending.     Discharge diagnosis     Principal Problem:   Community acquired pneumonia Active Problems:   Smoker   Hypokalemia   Acute respiratory failure with hypoxemia ([place])   Flu    Discharge instructions    Discharge Instructions    Discharge instructions    Complete by:  As directed    Follow with Primary MD Purvis Kilts, MD in 5-7 days   Get CBC, CMP, 2 view Chest X ray checked  by Primary MD or SNF MD in 5-7 days ( we routinely change or add medications that can affect your baseline labs and fluid status, therefore we recommend that you get the mentioned basic workup next visit with your PCP, your PCP may decide not to get them or add new tests based on their clinical decision)   Activity: As tolerated with Full fall precautions use walker/cane & assistance as needed   Disposition Home  Diet:   Diet heart healthy/carb modified    For Heart failure patients - Check your Weight same time everyday, if you gain over 2 pounds, or you develop in leg swelling, experience more shortness of breath or chest pain, call your Primary MD immediately. Follow Cardiac Low Salt Diet and 1.5 lit/day fluid restriction.   On your next visit with your primary care physician please Get Medicines reviewed and adjusted.   Please request your Prim.MD to go over all Hospital Tests and Procedure/Radiological results at the follow up, please get all Hospital records sent to your Prim MD by signing hospital release before you go home.   If you experience worsening of your admission symptoms, develop shortness of breath, life threatening emergency, suicidal or homicidal thoughts you must seek medical attention immediately by calling 911 or calling your  MD immediately  if symptoms less severe.  You Must read complete instructions/literature along with all the possible adverse reactions/side effects for all the Medicines you take and that have been prescribed to you. Take any new Medicines after you have completely understood and accpet all the possible adverse reactions/side effects.   Do not drive, operate heavy machinery, perform activities at heights, swimming or participation in water activities or provide baby sitting services if your were admitted for syncope or siezures until you have seen by Primary MD or a Neurologist and advised to do so again.  Do not drive when taking Pain medications.    Do not take more than prescribed Pain, Sleep and Anxiety Medications  Special Instructions: If you have smoked or chewed Tobacco  in the last 2 yrs please stop smoking, stop any regular Alcohol  and or any Recreational drug use.  Wear Seat belts while driving.   Please note  You were cared for by a hospitalist during your hospital stay. If you have any questions about your discharge medications or the care you received while you were in the hospital after you are discharged, you can call the unit and asked to speak with the hospitalist on call if the hospitalist that took care of you is not available. Once you are discharged, your primary care physician will handle any further medical issues. Please note that NO REFILLS for any discharge medications will be authorized once you are discharged, as it is imperative that you return to your primary care physician (or establish a relationship with a primary care physician if you do not have one) for your aftercare needs so that they can reassess your need for medications and monitor your lab values.   Increase activity slowly    Complete by:  As directed       Discharge Medications   Allergies as of 10/02/2016      Reactions   Betadine [povidone Iodine] Swelling      Medication List    STOP  taking these medications   azithromycin 250 MG tablet Commonly known as:  ZITHROMAX     TAKE these medications   acetaminophen 500 MG tablet Commonly known as:  TYLENOL Take 1,000 mg by mouth every 6 (six) hours as needed.   ALKA-SELTZER PLUS COLD 2-7.8-325 MG Tbef Generic drug:  Chlorphen-Phenyleph-ASA Take 1 tablet by mouth daily as needed (cold symptoms).   atorvastatin 10 MG tablet Commonly known as:  LIPITOR Take 10 mg by mouth at bedtime.   benzonatate 100 MG capsule Commonly known as:  TESSALON Take 100 mg by mouth 3 (three) times daily as needed for cough.  dextromethorphan-guaiFENesin 30-600 MG 12hr tablet Commonly known as:  MUCINEX DM Take 1 tablet by mouth 2 (two) times daily.   doxycycline 100 MG capsule Commonly known as:  VIBRAMYCIN Take 100 mg by mouth 2 (two) times daily. For 10 days   FLUoxetine 20 MG capsule Commonly known as:  PROZAC TAKE ONE CAPSULE BY MOUTH EVERY DAY   glimepiride 4 MG tablet Commonly known as:  AMARYL Take 4 mg by mouth daily with breakfast.   ibuprofen 200 MG tablet Commonly known as:  ADVIL,MOTRIN Take 400 mg by mouth every 6 (six) hours as needed.   lisinopril 2.5 MG tablet Commonly known as:  PRINIVIL,ZESTRIL Take 2.5 mg by mouth daily.   megestrol 40 MG tablet Commonly known as:  MEGACE TAKE ONE TABLET BY MOUTH ONCE DAILY   naproxen sodium 220 MG tablet Commonly known as:  ANAPROX Take 220 mg by mouth 2 (two) times daily with a meal.   oseltamivir 75 MG capsule Commonly known as:  TAMIFLU Take 1 capsule (75 mg total) by mouth 2 (two) times daily.            Durable Medical Equipment        Start     Ordered   10/02/16 0859  For home use only DME oxygen  Once    Question Answer Comment  Mode or (Route) Nasal cannula   Liters per Minute 2   Frequency Continuous (stationary and portable oxygen unit needed)   Oxygen conserving device Yes   Oxygen delivery system Gas      10/02/16 0858       Follow-up Information    Purvis Kilts, MD. Schedule an appointment as soon as possible for a visit in 5 day(s).   Specialty:  Family Medicine Contact information: [address] [place] [id number] [phone]           Major procedures and Radiology Reports - PLEASE review detailed and final reports thoroughly  -         Dg Chest 2 View  Result Date: 10/01/2016 CLINICAL DATA:  Shortness of breath since this past Monday with increasing severity. The patient reports a cough for the past 5 days. Current smoker. History of hypertension. EXAM: CHEST  2 VIEW COMPARISON:  PA and lateral chest x-ray of September 29, 2016 FINDINGS: The lungs are well-expanded. The interstitial markings remain increased. Confluent density has developed medially in the left upper lobe. The cardiac silhouette is normal in size. The pulmonary vascularity is not engorged. There is calcification in the [place] of the aortic arch. There is no pleural effusion. The bony thorax exhibits no acute abnormality. IMPRESSION: Chronic bronchitic changes with superimposed acute interstitial pneumonia (or less likely edema) with an area of alveolar pneumonia developing in the right upper lobe. Thoracic aortic atherosclerosis. Electronically Signed   By: David  Martinique M.D.   On: 10/01/2016 12:38   Dg Chest 2 View  Result Date: 09/29/2016 CLINICAL DATA:  Nonproductive cough. EXAM: CHEST  2 VIEW COMPARISON:  02/05/2009. FINDINGS: Mediastinum hilar structures are stable. Borderline cardiomegaly. Diffuse bilateral pulmonary interstitial prominence. Interstitial edema and/or pneumonitis could present this fashion. No pleural effusion or pneumothorax. IMPRESSION: 1. Diffuse bilateral from interstitial prominence consistent interstitial edema and/or interstitial pneumonitis. 2. Borderline cardiomegaly. Electronically Signed   By: Marcello Moores  Register   On: 09/29/2016 09:33    Micro Results     Recent Results (from the past  240 hour(s))  Culture, blood (routine x 2)  Status: None (Preliminary result)   Collection Time: 10/01/16  7:39 PM  Result Value Ref Range Status   Specimen Description BLOOD RIGHT HAND  Final   Special Requests BOTTLES DRAWN AEROBIC AND ANAEROBIC 6CC  Final   Culture PENDING  Incomplete   Report Status PENDING  Incomplete  Culture, blood (routine x 2)     Status: None (Preliminary result)   Collection Time: 10/01/16  7:39 PM  Result Value Ref Range Status   Specimen Description RIGHT ANTECUBITAL  Final   Special Requests BOTTLES DRAWN AEROBIC AND ANAEROBIC 6CC  Final   Culture PENDING  Incomplete   Report Status PENDING  Incomplete    Today   Subjective    Jeanette Aguirre today has no headache,no chest abdominal pain,no new weakness tingling or numbness, feels much better wants to go home today.    Objective   Blood pressure 129/63, pulse 71, temperature 99 F (37.2 C), temperature source Oral, resp. rate 18, height 5\' 6"  (1.676 m), weight 115.3 kg (254 lb 3.1 oz), last menstrual period 03/03/2012, SpO2 90 %.   Intake/Output Summary (Last 24 hours) at 10/02/16 0904 Last data filed at 10/02/16 0635  Gross per 24 hour  Intake          1258.75 ml  Output                0 ml  Net          1258.75 ml    Exam Awake Alert, Oriented x 3, No new F.N deficits, Normal affect [place].AT,PERRAL Supple Neck,No JVD, No cervical lymphadenopathy appriciated.  Symmetrical Chest Phang movement, Good air movement bilaterally, CTAB RRR,No Gallops,Rubs or new Murmurs, No Parasternal Heave +ve B.Sounds, Abd Soft, Non tender, No organomegaly appriciated, No rebound -guarding or rigidity. No Cyanosis, Clubbing or edema, No new Rash or bruise   Data Review   CBC w Diff:  Lab Results  Component Value Date   WBC 5.8 10/02/2016   HGB 11.2 (L) 10/02/2016   HCT 34.1 (L) 10/02/2016   PLT 196 10/02/2016   LYMPHOPCT 26 10/01/2016   MONOPCT 5 10/01/2016   EOSPCT 1 10/01/2016   BASOPCT 0 10/01/2016     CMP:  Lab Results  Component Value Date   NA 138 10/02/2016   K 3.7 10/02/2016   CL 99 (L) 10/02/2016   CO2 31 10/02/2016   BUN 9 10/02/2016   CREATININE 0.48 10/02/2016   PROT 7.0 10/01/2016   ALBUMIN 3.3 (L) 10/01/2016   BILITOT 0.5 10/01/2016   ALKPHOS 112 10/01/2016   AST 41 10/01/2016   ALT 31 10/01/2016  .   Total Time in preparing paper work, data evaluation and todays exam - 35 minutes  Thurnell Lose M.D on 10/02/2016 at 9:04 AM  [facility]   Office  [phone]

## 2016-10-02 NOTE — Progress Notes (Signed)
SATURATION QUALIFICATIONS: (This note is used to comply with regulatory documentation for home oxygen)  Patient Saturations on Room Air at Rest = 94%  Patient Saturations on Room Air while Ambulating = 91%  Patient Saturations on 0 Liters of oxygen while Ambulating = 91%  Please briefly explain why patient needs home oxygen:

## 2016-10-02 NOTE — Discharge Instructions (Signed)
Follow with Primary MD Purvis Kilts, MD in 5-7 days   Get CBC, CMP, 2 view Chest X ray checked  by Primary MD or SNF MD in 5-7 days ( we routinely change or add medications that can affect your baseline labs and fluid status, therefore we recommend that you get the mentioned basic workup next visit with your PCP, your PCP may decide not to get them or add new tests based on their clinical decision)   Activity: As tolerated with Full fall precautions use walker/cane & assistance as needed   Disposition Home     Diet:   Diet heart healthy/carb modified    For Heart failure patients - Check your Weight same time everyday, if you gain over 2 pounds, or you develop in leg swelling, experience more shortness of breath or chest pain, call your Primary MD immediately. Follow Cardiac Low Salt Diet and 1.5 lit/day fluid restriction.   On your next visit with your primary care physician please Get Medicines reviewed and adjusted.   Please request your Prim.MD to go over all Hospital Tests and Procedure/Radiological results at the follow up, please get all Hospital records sent to your Prim MD by signing hospital release before you go home.   If you experience worsening of your admission symptoms, develop shortness of breath, life threatening emergency, suicidal or homicidal thoughts you must seek medical attention immediately by calling 911 or calling your MD immediately  if symptoms less severe.  You Must read complete instructions/literature along with all the possible adverse reactions/side effects for all the Medicines you take and that have been prescribed to you. Take any new Medicines after you have completely understood and accpet all the possible adverse reactions/side effects.   Do not drive, operate heavy machinery, perform activities at heights, swimming or participation in water activities or provide baby sitting services if your were admitted for syncope or siezures until you have  seen by Primary MD or a Neurologist and advised to do so again.  Do not drive when taking Pain medications.    Do not take more than prescribed Pain, Sleep and Anxiety Medications  Special Instructions: If you have smoked or chewed Tobacco  in the last 2 yrs please stop smoking, stop any regular Alcohol  and or any Recreational drug use.  Wear Seat belts while driving.   Please note  You were cared for by a hospitalist during your hospital stay. If you have any questions about your discharge medications or the care you received while you were in the hospital after you are discharged, you can call the unit and asked to speak with the hospitalist on call if the hospitalist that took care of you is not available. Once you are discharged, your primary care physician will handle any further medical issues. Please note that NO REFILLS for any discharge medications will be authorized once you are discharged, as it is imperative that you return to your primary care physician (or establish a relationship with a primary care physician if you do not have one) for your aftercare needs so that they can reassess your need for medications and monitor your lab values.

## 2016-10-06 LAB — CULTURE, BLOOD (ROUTINE X 2)
CULTURE: NO GROWTH
CULTURE: NO GROWTH

## 2016-10-15 ENCOUNTER — Ambulatory Visit (HOSPITAL_COMMUNITY)
Admission: RE | Admit: 2016-10-15 | Discharge: 2016-10-15 | Disposition: A | Discharge status: Home / Self Care | Payer: PRIVATE HEALTH INSURANCE | Source: Ambulatory Visit | Attending: Family Medicine | Admitting: Family Medicine

## 2016-10-15 ENCOUNTER — Other Ambulatory Visit (HOSPITAL_COMMUNITY): Payer: Self-pay | Admitting: Family Medicine

## 2016-10-15 ENCOUNTER — Other Ambulatory Visit (HOSPITAL_COMMUNITY)
Admission: RE | Admit: 2016-10-15 | Discharge: 2016-10-15 | Disposition: A | Discharge status: Home / Self Care | Payer: PRIVATE HEALTH INSURANCE | Source: Ambulatory Visit | Attending: Internal Medicine | Admitting: Internal Medicine

## 2016-10-15 DIAGNOSIS — E1165 Type 2 diabetes mellitus with hyperglycemia: Secondary | ICD-10-CM | POA: Insufficient documentation

## 2016-10-15 DIAGNOSIS — J189 Pneumonia, unspecified organism: Secondary | ICD-10-CM | POA: Insufficient documentation

## 2016-10-15 DIAGNOSIS — J157 Pneumonia due to Mycoplasma pneumoniae: Secondary | ICD-10-CM | POA: Diagnosis not present

## 2016-10-15 DIAGNOSIS — R0902 Hypoxemia: Secondary | ICD-10-CM | POA: Insufficient documentation

## 2016-10-15 DIAGNOSIS — Z1389 Encounter for screening for other disorder: Secondary | ICD-10-CM | POA: Diagnosis not present

## 2016-10-15 LAB — BASIC METABOLIC PANEL
ANION GAP: 15 (ref 5–15)
BUN: 21 mg/dL — ABNORMAL HIGH (ref 6–20)
CHLORIDE: 94 mmol/L — AB (ref 101–111)
CO2: 24 mmol/L (ref 22–32)
Calcium: 9.6 mg/dL (ref 8.9–10.3)
Creatinine, Ser: 0.83 mg/dL (ref 0.44–1.00)
GFR calc Af Amer: >60 mL/min (ref >60)
Glucose, Bld: 180 mg/dL — ABNORMAL HIGH (ref 65–99)
POTASSIUM: 4.1 mmol/L (ref 3.5–5.1)
SODIUM: 133 mmol/L — AB (ref 135–145)

## 2016-10-15 LAB — CBC WITH DIFFERENTIAL/PLATELET
BASOS ABS: 0.1 10*3/uL (ref 0.0–0.1)
Basophils Relative: 0 %
Eosinophils Absolute: 0.3 10*3/uL (ref 0.0–0.7)
Eosinophils Relative: 2 %
HCT: 41.2 % (ref 36.0–46.0)
Hemoglobin: 13.9 g/dL (ref 12.0–15.0)
LYMPHS ABS: 4.9 10*3/uL — AB (ref 0.7–4.0)
Lymphocytes Relative: 29 %
MCH: 28.8 pg (ref 26.0–34.0)
MCHC: 33.7 g/dL (ref 30.0–36.0)
MCV: 85.3 fL (ref 78.0–100.0)
Monocytes Absolute: 0.9 10*3/uL (ref 0.1–1.0)
Monocytes Relative: 5 %
NEUTROS ABS: 11.1 10*3/uL — AB (ref 1.7–7.7)
NEUTROS PCT: 64 %
Platelets: 456 10*3/uL — ABNORMAL HIGH (ref 150–400)
RBC: 4.83 MIL/uL (ref 3.87–5.11)
RDW: 12.9 % (ref 11.5–15.5)
WBC: 17.2 10*3/uL — AB (ref 4.0–10.5)

## 2016-10-16 LAB — MICROALBUMIN, URINE: MICROALB UR: 140.2 ug/mL — AB

## 2016-11-14 ENCOUNTER — Other Ambulatory Visit (HOSPITAL_COMMUNITY): Payer: Self-pay | Admitting: Internal Medicine

## 2016-11-14 DIAGNOSIS — Z1231 Encounter for screening mammogram for malignant neoplasm of breast: Secondary | ICD-10-CM

## 2016-12-01 ENCOUNTER — Ambulatory Visit (HOSPITAL_COMMUNITY): Payer: PRIVATE HEALTH INSURANCE

## 2016-12-08 ENCOUNTER — Ambulatory Visit (HOSPITAL_COMMUNITY): Payer: PRIVATE HEALTH INSURANCE

## 2017-01-05 ENCOUNTER — Ambulatory Visit (HOSPITAL_COMMUNITY)
Admission: RE | Admit: 2017-01-05 | Discharge: 2017-01-05 | Disposition: A | Discharge status: Home / Self Care | Payer: PRIVATE HEALTH INSURANCE | Source: Ambulatory Visit | Attending: Internal Medicine | Admitting: Internal Medicine

## 2017-01-05 DIAGNOSIS — Z1231 Encounter for screening mammogram for malignant neoplasm of breast: Secondary | ICD-10-CM | POA: Insufficient documentation

## 2017-05-20 ENCOUNTER — Encounter (INDEPENDENT_AMBULATORY_CARE_PROVIDER_SITE_OTHER): Payer: Self-pay

## 2017-05-20 ENCOUNTER — Encounter (INDEPENDENT_AMBULATORY_CARE_PROVIDER_SITE_OTHER): Payer: Self-pay | Admitting: Internal Medicine

## 2017-06-22 ENCOUNTER — Ambulatory Visit (INDEPENDENT_AMBULATORY_CARE_PROVIDER_SITE_OTHER): Payer: PRIVATE HEALTH INSURANCE | Admitting: Internal Medicine

## 2017-06-22 ENCOUNTER — Encounter (INDEPENDENT_AMBULATORY_CARE_PROVIDER_SITE_OTHER): Payer: Self-pay | Admitting: Internal Medicine

## 2017-07-03 ENCOUNTER — Encounter (HOSPITAL_COMMUNITY): Payer: Self-pay | Admitting: Emergency Medicine

## 2017-07-03 ENCOUNTER — Emergency Department (HOSPITAL_COMMUNITY)
Admission: EM | Admit: 2017-07-03 | Discharge: 2017-07-03 | Disposition: A | Discharge status: Home / Self Care | Payer: PRIVATE HEALTH INSURANCE | Attending: Emergency Medicine | Admitting: Emergency Medicine

## 2017-07-03 ENCOUNTER — Emergency Department (HOSPITAL_COMMUNITY): Payer: PRIVATE HEALTH INSURANCE

## 2017-07-03 DIAGNOSIS — J189 Pneumonia, unspecified organism: Secondary | ICD-10-CM | POA: Insufficient documentation

## 2017-07-03 DIAGNOSIS — Z7984 Long term (current) use of oral hypoglycemic drugs: Secondary | ICD-10-CM | POA: Insufficient documentation

## 2017-07-03 DIAGNOSIS — Z87891 Personal history of nicotine dependence: Secondary | ICD-10-CM | POA: Diagnosis not present

## 2017-07-03 DIAGNOSIS — Z9049 Acquired absence of other specified parts of digestive tract: Secondary | ICD-10-CM | POA: Diagnosis not present

## 2017-07-03 DIAGNOSIS — Z79899 Other long term (current) drug therapy: Secondary | ICD-10-CM | POA: Diagnosis not present

## 2017-07-03 DIAGNOSIS — R1013 Epigastric pain: Secondary | ICD-10-CM | POA: Diagnosis not present

## 2017-07-03 DIAGNOSIS — J181 Lobar pneumonia, unspecified organism: Secondary | ICD-10-CM

## 2017-07-03 DIAGNOSIS — R509 Fever, unspecified: Secondary | ICD-10-CM | POA: Diagnosis present

## 2017-07-03 DIAGNOSIS — F419 Anxiety disorder, unspecified: Secondary | ICD-10-CM | POA: Diagnosis not present

## 2017-07-03 DIAGNOSIS — I10 Essential (primary) hypertension: Secondary | ICD-10-CM | POA: Diagnosis not present

## 2017-07-03 LAB — BASIC METABOLIC PANEL
ANION GAP: 12 (ref 5–15)
BUN: 10 mg/dL (ref 6–20)
CALCIUM: 9.2 mg/dL (ref 8.9–10.3)
CO2: 25 mmol/L (ref 22–32)
CREATININE: 0.62 mg/dL (ref 0.44–1.00)
Chloride: 98 mmol/L — ABNORMAL LOW (ref 101–111)
GFR calc Af Amer: >60 mL/min (ref >60)
GFR calc non Af Amer: >60 mL/min (ref >60)
GLUCOSE: 137 mg/dL — AB (ref 65–99)
POTASSIUM: 3.7 mmol/L (ref 3.5–5.1)
Sodium: 135 mmol/L (ref 135–145)

## 2017-07-03 LAB — CBC WITH DIFFERENTIAL/PLATELET
BASOS ABS: 0 10*3/uL (ref 0.0–0.1)
Basophils Relative: 0 %
Eosinophils Absolute: 0 10*3/uL (ref 0.0–0.7)
Eosinophils Relative: 0 %
HEMATOCRIT: 36.7 % (ref 36.0–46.0)
HEMOGLOBIN: 11.9 g/dL — AB (ref 12.0–15.0)
LYMPHS ABS: 1.6 10*3/uL (ref 0.7–4.0)
LYMPHS PCT: 8 %
MCH: 28.3 pg (ref 26.0–34.0)
MCHC: 32.4 g/dL (ref 30.0–36.0)
MCV: 87.2 fL (ref 78.0–100.0)
Monocytes Absolute: 1 10*3/uL (ref 0.1–1.0)
Monocytes Relative: 5 %
NEUTROS ABS: 16.7 10*3/uL — AB (ref 1.7–7.7)
Neutrophils Relative %: 87 %
Platelets: 290 10*3/uL (ref 150–400)
RBC: 4.21 MIL/uL (ref 3.87–5.11)
RDW: 13.9 % (ref 11.5–15.5)
WBC: 19.3 10*3/uL — AB (ref 4.0–10.5)

## 2017-07-03 LAB — HEPATIC FUNCTION PANEL
ALT: 17 U/L (ref 14–54)
AST: 18 U/L (ref 15–41)
Albumin: 4.1 g/dL (ref 3.5–5.0)
Alkaline Phosphatase: 112 U/L (ref 38–126)
BILIRUBIN DIRECT: 0.2 mg/dL (ref 0.1–0.5)
BILIRUBIN INDIRECT: 0.8 mg/dL (ref 0.3–0.9)
BILIRUBIN TOTAL: 1 mg/dL (ref 0.3–1.2)
Total Protein: 8.1 g/dL (ref 6.5–8.1)

## 2017-07-03 LAB — CG4 I-STAT (LACTIC ACID): Lactic Acid, Venous: 2.85 mmol/L (ref 0.5–1.9)

## 2017-07-03 LAB — POCT I-STAT TROPONIN I: TROPONIN I, POC: 0 ng/mL (ref 0.00–0.08)

## 2017-07-03 LAB — LIPASE, BLOOD: Lipase: 18 U/L (ref 11–51)

## 2017-07-03 MED ORDER — AZITHROMYCIN 250 MG PO TABS
500.0000 mg | ORAL_TABLET | Freq: Once | ORAL | Status: AC
  Administered 2017-07-03: 500 mg via ORAL
  Filled 2017-07-03 (×2): qty 2

## 2017-07-03 MED ORDER — ONDANSETRON HCL 4 MG/2ML IJ SOLN
4.0000 mg | Freq: Once | INTRAMUSCULAR | Status: AC
  Administered 2017-07-03: 4 mg via INTRAVENOUS
  Filled 2017-07-03: qty 2

## 2017-07-03 MED ORDER — BENZONATATE 100 MG PO CAPS: 100.0000 mg | ORAL_CAPSULE | Freq: Three times a day (TID) | ORAL | 0 refills | Status: DC

## 2017-07-03 MED ORDER — SODIUM CHLORIDE 0.9 % IV BOLUS (SEPSIS)
1000.0000 mL | Freq: Once | INTRAVENOUS | Status: AC
  Administered 2017-07-03: 1000 mL via INTRAVENOUS

## 2017-07-03 MED ORDER — ACETAMINOPHEN 500 MG PO TABS
1000.0000 mg | ORAL_TABLET | Freq: Once | ORAL | Status: AC
  Administered 2017-07-03: 1000 mg via ORAL

## 2017-07-03 MED ORDER — MORPHINE SULFATE (PF) 4 MG/ML IV SOLN
4.0000 mg | Freq: Once | INTRAVENOUS | Status: AC
Start: 2017-07-03 — End: 2017-07-03
  Administered 2017-07-03: 4 mg via INTRAVENOUS
  Filled 2017-07-03: qty 1

## 2017-07-03 MED ORDER — KETOROLAC TROMETHAMINE 30 MG/ML IJ SOLN
30.0000 mg | Freq: Once | INTRAMUSCULAR | Status: AC
  Administered 2017-07-03: 30 mg via INTRAVENOUS
  Filled 2017-07-03: qty 1

## 2017-07-03 MED ORDER — IBUPROFEN 800 MG PO TABS: 800.0000 mg | ORAL_TABLET | Freq: Three times a day (TID) | ORAL | 0 refills | Status: DC

## 2017-07-03 MED ORDER — DEXTROSE 5 % IV SOLN
1.0000 g | Freq: Once | INTRAVENOUS | Status: AC
  Administered 2017-07-03: 1 g via INTRAVENOUS
  Filled 2017-07-03: qty 10

## 2017-07-03 MED ORDER — ACETAMINOPHEN 500 MG PO TABS
ORAL_TABLET | ORAL | Status: AC
  Filled 2017-07-03: qty 2

## 2017-07-03 MED ORDER — AZITHROMYCIN 250 MG PO TABS: 250.0000 mg | ORAL_TABLET | Freq: Every day | ORAL | 0 refills | Status: DC

## 2017-07-03 MED ORDER — IOPAMIDOL (ISOVUE-370) INJECTION 76%
100.0000 mL | Freq: Once | INTRAVENOUS | Status: AC | PRN
  Administered 2017-07-03: 100 mL via INTRAVENOUS
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
## 2017-07-03 NOTE — ED Triage Notes (Signed)
Pt states she began feeling SOB last night, denies cough or congestion. Went to PCP today and was referred here. Pt has not taken Tylenol or Motrin.

## 2017-07-03 NOTE — ED Notes (Signed)
Patient transported to CT 

## 2017-07-03 NOTE — Discharge Instructions (Signed)
Your bloodwork showed an elevated blood count at 19,000 which needs to be rechecked. The radiologist suggested that you have a repeat CT scan after you have improved to make sure that your lungs are back to normal Please have your doctor acquire your records and follow up with you regarding the results and planning for follow-up CAT scan.  Zithromax daily for 5 days  Ibuprofen 800 mg, Tylenol 1000 mg, alternate every 4 hours as needed for fever and body aches.  Tessalon, every 8 hours as needed if you are coughing.  Please return to the emergency department for severe or worsening pain, fever, shortness of breath or vomiting

## 2017-07-20 ENCOUNTER — Telehealth: Payer: Self-pay

## 2017-07-20 NOTE — Telephone Encounter (Signed)
Pt received a triage letter from [facility] back in Dec 2017 and said that she needs to have her colonoscopy done before the end of November. Please call [phone]

## 2017-07-22 ENCOUNTER — Telehealth: Payer: Self-pay

## 2017-07-22 NOTE — Telephone Encounter (Signed)
Gastroenterology Pre-Procedure Review  Request Date: 10/321/2018 Requesting Physician: Dr. Gerarda Fraction  PATIENT REVIEW QUESTIONS: The patient responded to the following health history questions as indicated:    This will be first colonoscopy for pt. She has had gall bladder removed Has some diarrhea maybe a couple of times a week but not bad.   1. Diabetes Melitis: YES 2. Joint replacements in the past 12 months: no 3. Major health problems in the past 3 months: no 4. Has an artificial valve or MVP: no 5. Has a defibrillator: no 6. Has been advised in past to take antibiotics in advance of a procedure like teeth cleaning: no 7. Family history of colon cancer: no  8. Alcohol Use: no 9. History of sleep apnea: no  10. History of coronary artery or other vascular stents placed within the last 12 months: no 11. History of any prior anesthesia complications: no    MEDICATIONS & ALLERGIES:    Patient reports the following regarding taking any blood thinners:   Plavix? no Aspirin? no Coumadin? no Brilinta? no Xarelto? no Eliquis? no Pradaxa? no Savaysa? no Effient? no  Patient confirms/reports the following medications:  Current Outpatient Prescriptions  Medication Sig Dispense Refill  . acetaminophen (TYLENOL) 500 MG tablet Take 1,000 mg by mouth every 6 (six) hours as needed.    Marland Kitchen atorvastatin (LIPITOR) 10 MG tablet Take 20 mg by mouth at bedtime.     Marland Kitchen FLUoxetine (PROZAC) 20 MG tablet Take 30 mg by mouth daily.    Marland Kitchen FLUoxetine (PROZAC) 20 MG tablet Take 20 mg by mouth daily. Takes one and a half tablet  ( 30 mg) daily    . glimepiride (AMARYL) 4 MG tablet Take 4 mg by mouth daily with breakfast.    . ibuprofen (ADVIL,MOTRIN) 800 MG tablet Take 1 tablet (800 mg total) by mouth 3 (three) times daily. (Patient taking differently: Take 800 mg by mouth every 8 (eight) hours as needed. ) 21 tablet 0  . lisinopril (PRINIVIL,ZESTRIL) 2.5 MG tablet Take 2.5 mg by mouth daily.    Marland Kitchen  azithromycin (ZITHROMAX Z-PAK) 250 MG tablet Take 1 tablet (250 mg total) by mouth daily. 500mg  PO day 1, then 250mg  PO days 205 (Patient not taking: Reported on 07/22/2017) 6 tablet 0  . benzonatate (TESSALON) 100 MG capsule Take 1 capsule (100 mg total) by mouth every 8 (eight) hours. (Patient not taking: Reported on 07/22/2017) 21 capsule 0  . megestrol (MEGACE) 40 MG tablet TAKE ONE TABLET BY MOUTH ONCE DAILY (Patient not taking: Reported on 10/01/2016) 30 tablet 0  . naproxen sodium (ANAPROX) 220 MG tablet Take 220 mg by mouth 2 (two) times daily with a meal.     No current facility-administered medications for this visit.     Patient confirms/reports the following allergies:  Allergies  Allergen Reactions  . Betadine [Povidone Iodine] Swelling    No orders of the defined types were placed in this encounter.   AUTHORIZATION INFORMATION Primary Insurance: ID #: Group #:  Pre-Cert / Auth required:  Pre-Cert / Auth #:   Secondary Insurance:   ID #:   Group #:  Pre-Cert / Auth required: Pre-Cert / Auth #:   SCHEDULE INFORMATION: Procedure has been scheduled as follows:  Date:              Time:   Location:   This Gastroenterology Pre-Precedure Review Form is being routed to the following provider(s): R. Garfield Cornea, MD

## 2017-07-22 NOTE — Telephone Encounter (Signed)
See separate triage.  

## 2017-07-24 NOTE — Telephone Encounter (Signed)
Diarrhea sounds more chronic and s/p cholecystectomy rather than acute issue. On Prozac. Ok to schedule with 12.5 mg preprocedure Phenergan.

## 2017-07-29 ENCOUNTER — Other Ambulatory Visit: Payer: Self-pay

## 2017-07-29 DIAGNOSIS — Z1211 Encounter for screening for malignant neoplasm of colon: Secondary | ICD-10-CM

## 2017-07-29 MED ORDER — PEG 3350-KCL-NA BICARB-NACL 420 G PO SOLR: 4000.0000 mL | ORAL | 0 refills | Status: DC

## 2017-07-29 NOTE — Telephone Encounter (Signed)
Eric, any recommendations for diabetic meds?

## 2017-07-29 NOTE — Telephone Encounter (Signed)
Rx sent to the pharmacy and instructions mailed to pt.  

## 2017-07-29 NOTE — Telephone Encounter (Signed)
PT is scheduled for 09/04/2017 at 9:30 Am.

## 2017-07-29 NOTE — Telephone Encounter (Signed)
Half night before, none morning of.  Check CBGs every 4 hours while awake. Make sure she's comsuming clears that have some sugar in them to help prevent low blood sugar. They should check her blood sugar when they get her checked in to the endo unit.

## 2017-09-04 ENCOUNTER — Encounter (HOSPITAL_COMMUNITY)
Admission: RE | Disposition: A | Discharge status: Home / Self Care | Payer: Self-pay | Source: Ambulatory Visit | Attending: Internal Medicine

## 2017-09-04 ENCOUNTER — Encounter (HOSPITAL_COMMUNITY): Payer: Self-pay | Admitting: *Deleted

## 2017-09-04 ENCOUNTER — Other Ambulatory Visit: Payer: Self-pay

## 2017-09-04 ENCOUNTER — Ambulatory Visit (HOSPITAL_COMMUNITY)
Admission: RE | Admit: 2017-09-04 | Discharge: 2017-09-04 | Disposition: A | Discharge status: Home / Self Care | Payer: PRIVATE HEALTH INSURANCE | Source: Ambulatory Visit | Attending: Internal Medicine | Admitting: Internal Medicine

## 2017-09-04 DIAGNOSIS — E119 Type 2 diabetes mellitus without complications: Secondary | ICD-10-CM | POA: Diagnosis not present

## 2017-09-04 DIAGNOSIS — Z1212 Encounter for screening for malignant neoplasm of rectum: Secondary | ICD-10-CM

## 2017-09-04 DIAGNOSIS — K5730 Diverticulosis of large intestine without perforation or abscess without bleeding: Secondary | ICD-10-CM | POA: Diagnosis not present

## 2017-09-04 DIAGNOSIS — I10 Essential (primary) hypertension: Secondary | ICD-10-CM | POA: Diagnosis not present

## 2017-09-04 DIAGNOSIS — Z7984 Long term (current) use of oral hypoglycemic drugs: Secondary | ICD-10-CM | POA: Diagnosis not present

## 2017-09-04 DIAGNOSIS — E785 Hyperlipidemia, unspecified: Secondary | ICD-10-CM | POA: Diagnosis not present

## 2017-09-04 DIAGNOSIS — D124 Benign neoplasm of descending colon: Secondary | ICD-10-CM | POA: Diagnosis not present

## 2017-09-04 DIAGNOSIS — Z79899 Other long term (current) drug therapy: Secondary | ICD-10-CM | POA: Insufficient documentation

## 2017-09-04 DIAGNOSIS — F419 Anxiety disorder, unspecified: Secondary | ICD-10-CM | POA: Insufficient documentation

## 2017-09-04 DIAGNOSIS — Z1211 Encounter for screening for malignant neoplasm of colon: Secondary | ICD-10-CM | POA: Diagnosis not present

## 2017-09-04 DIAGNOSIS — Z87891 Personal history of nicotine dependence: Secondary | ICD-10-CM | POA: Diagnosis not present

## 2017-09-04 DIAGNOSIS — D123 Benign neoplasm of transverse colon: Secondary | ICD-10-CM | POA: Diagnosis not present

## 2017-09-04 DIAGNOSIS — Z91048 Other nonmedicinal substance allergy status: Secondary | ICD-10-CM | POA: Diagnosis not present

## 2017-09-04 HISTORY — PX: COLONOSCOPY: SHX5424

## 2017-09-04 HISTORY — DX: Pneumonia, unspecified organism: J18.9

## 2017-09-04 HISTORY — DX: Type 2 diabetes mellitus without complications: E11.9

## 2017-09-04 LAB — GLUCOSE, CAPILLARY: Glucose-Capillary: 124 mg/dL — ABNORMAL HIGH (ref 65–99)

## 2017-09-04 SURGERY — COLONOSCOPY
Anesthesia: Moderate Sedation

## 2017-09-04 MED ORDER — ONDANSETRON HCL 4 MG/2ML IJ SOLN
INTRAMUSCULAR | Status: DC | PRN
  Administered 2017-09-04: 4 mg via INTRAVENOUS

## 2017-09-04 MED ORDER — SIMETHICONE 40 MG/0.6ML PO SUSP
ORAL | Status: DC | PRN
  Administered 2017-09-04: 2.5 mL

## 2017-09-04 MED ORDER — MIDAZOLAM HCL 5 MG/5ML IJ SOLN
INTRAMUSCULAR | Status: AC
  Filled 2017-09-04: qty 10

## 2017-09-04 MED ORDER — MEPERIDINE HCL 100 MG/ML IJ SOLN
INTRAMUSCULAR | Status: DC | PRN
  Administered 2017-09-04 (×2): 50 mg via INTRAVENOUS

## 2017-09-04 MED ORDER — ONDANSETRON HCL 4 MG/2ML IJ SOLN
INTRAMUSCULAR | Status: AC
  Filled 2017-09-04: qty 2

## 2017-09-04 MED ORDER — SODIUM CHLORIDE 0.9% FLUSH
INTRAVENOUS | Status: AC
  Filled 2017-09-04: qty 10

## 2017-09-04 MED ORDER — PROMETHAZINE HCL 25 MG/ML IJ SOLN
INTRAMUSCULAR | Status: AC
  Filled 2017-09-04: qty 1

## 2017-09-04 MED ORDER — SODIUM CHLORIDE 0.9 % IV SOLN
INTRAVENOUS | Status: DC
Start: 2017-09-04 — End: 2017-09-04
  Administered 2017-09-04: 1000 mL via INTRAVENOUS

## 2017-09-04 MED ORDER — PROMETHAZINE HCL 25 MG/ML IJ SOLN
12.5000 mg | Freq: Once | INTRAMUSCULAR | Status: AC
  Administered 2017-09-04: 12.5 mg via INTRAVENOUS

## 2017-09-04 MED ORDER — MEPERIDINE HCL 100 MG/ML IJ SOLN
INTRAMUSCULAR | Status: AC
  Filled 2017-09-04: qty 2

## 2017-09-04 MED ORDER — MIDAZOLAM HCL 5 MG/5ML IJ SOLN
INTRAMUSCULAR | Status: DC | PRN
  Administered 2017-09-04: 2 mg via INTRAVENOUS
  Administered 2017-09-04: 1 mg via INTRAVENOUS
  Administered 2017-09-04: 2 mg via INTRAVENOUS
  Administered 2017-09-04: 1 mg via INTRAVENOUS

## 2017-09-04 NOTE — H&P (Signed)
@LOGO @   Primary Care Physician:  Redmond School, MD Primary Gastroenterologist:  Dr. Gala Romney  Pre-Procedure History & Physical: HPI:  Jeanette Aguirre is a 56 y.o. female is here for a screening colonoscopy. First ever screening colonoscopy. No bowel symptoms. No family history of colon cancer.  Past Medical History:  Diagnosis Date  . Anxiety   . Diabetes mellitus without complication ([place])   . Hyperlipemia   . Hypertension   . Pneumonia     Past Surgical History:  Procedure Laterality Date  . CHOLECYSTECTOMY    . TONSILLECTOMY    . TYMPANOSTOMY TUBE PLACEMENT      Prior to Admission medications   Medication Sig Start Date End Date Taking? Authorizing Provider  acetaminophen (TYLENOL) 500 MG tablet Take 1,000 mg by mouth every 6 (six) hours as needed for moderate pain or headache.    Yes [provider]  atorvastatin (LIPITOR) 20 MG tablet Take 20 mg by mouth at bedtime.    Yes [provider]  FLUoxetine (PROZAC) 20 MG tablet Take 30 mg by mouth daily.   Yes [provider]  glimepiride (AMARYL) 4 MG tablet Take 4 mg by mouth daily with breakfast.   Yes [provider]  ibuprofen (ADVIL,MOTRIN) 200 MG tablet Take 400 mg by mouth every 6 (six) hours as needed for headache or moderate pain.   Yes [provider]  lisinopril (PRINIVIL,ZESTRIL) 2.5 MG tablet Take 2.5 mg by mouth daily.   Yes [provider]  polyethylene glycol-electrolytes (TRILYTE) 420 g solution Take 4,000 mLs as directed by mouth. 07/29/17  Yes Carlis Stable, NP  sitaGLIPtin-metformin (JANUMET) 50-1000 MG tablet Take 1 tablet by mouth 2 (two) times daily with a meal.   Yes [provider]  benzonatate (TESSALON) 100 MG capsule Take 1 capsule (100 mg total) by mouth every 8 (eight) hours. Patient not taking: Reported on 07/22/2017 07/03/17   Noemi Chapel, MD  megestrol (MEGACE) 40 MG tablet TAKE ONE TABLET BY MOUTH ONCE DAILY Patient not taking: Reported  on 10/01/2016 07/19/13   Estill Dooms, NP  naproxen sodium (ALEVE) 220 MG tablet Take 220 mg by mouth 2 (two) times daily as needed (for pain or headaches).    [provider]    Allergies as of 07/29/2017 - Review Complete 07/22/2017  Allergen Reaction Noted  . Betadine [povidone iodine] Swelling 10/01/2016    Family History  Problem Relation Age of Onset  . Diabetes Mother   . Peripheral vascular disease Mother   . Diabetes Father     Social History   Socioeconomic History  . Marital status: Divorced    Spouse name: Not on file  . Number of children: Not on file  . Years of education: Not on file  . Highest education level: Not on file  Social Needs  . Financial resource strain: Not on file  . Food insecurity - worry: Not on file  . Food insecurity - inability: Not on file  . Transportation needs - medical: Not on file  . Transportation needs - non-medical: Not on file  Occupational History  . Not on file  Tobacco Use  . Smoking status: Former Smoker    Last attempt to quit: 09/22/2016    Years since quitting: 0.9  . Smokeless tobacco: Never Used  Substance and Sexual Activity  . Alcohol use: Yes    Comment: very little  . Drug use: No  . Sexual activity: Not on file  Other Topics Concern  .  Not on file  Social History Narrative  . Not on file    Review of Systems: See HPI, otherwise negative ROS  Physical Exam: BP 120/71   Pulse 71   Temp 98.7 F (37.1 C) (Oral)   Resp 15   Ht 5\' 6"  (1.676 m)   Wt 250 lb (113.4 kg)   LMP 03/03/2012   SpO2 96%   BMI 40.35 kg/m  General:   Alert,  Well-developed, well-nourished, pleasant and cooperative in NAD Lungs:  Clear throughout to auscultation.   No wheezes, crackles, or rhonchi. No acute distress. Heart:  Regular rate and rhythm; no murmurs, clicks, rubs,  or gallops. Abdomen:  Soft, nontender and nondistended. No masses, hepatosplenomegaly or hernias noted. Normal bowel sounds, without guarding,  and without rebound.    Impression: Jeanette Aguirre is now here to undergo a screening colonoscopy.  First ever average her screening examination.  Recommendations:  I have offered the patient a screening colonoscopy today.  The risks, benefits, limitations, alternatives and imponderables have been reviewed with the patient. Questions have been answered. All parties are agreeable.   Risks, benefits, limitations, imponderables and alternatives regarding colonoscopy have been reviewed with the patient. Questions have been answered. All parties agreeable.     Notice:  This dictation was prepared with Dragon dictation along with smaller phrase technology. Any transcriptional errors that result from this process are unintentional and may not be corrected upon review.

## 2017-09-04 NOTE — Discharge Instructions (Addendum)
Colonoscopy Discharge Instructions  Read the instructions outlined below and refer to this sheet in the next few weeks. These discharge instructions provide you with general information on caring for yourself after you leave the hospital. Your doctor may also give you specific instructions. While your treatment has been planned according to the most current medical practices available, unavoidable complications occasionally occur. If you have any problems or questions after discharge, call Dr. Gala Romney at [phone]. ACTIVITY  You may resume your regular activity, but move at a slower pace for the next 24 hours.   Take frequent rest periods for the next 24 hours.   Walking will help get rid of the air and reduce the bloated feeling in your belly (abdomen).   No driving for 24 hours (because of the medicine (anesthesia) used during the test).    Do not sign any important legal documents or operate any machinery for 24 hours (because of the anesthesia used during the test).  NUTRITION  Drink plenty of fluids.   You may resume your normal diet as instructed by your doctor.   Begin with a light meal and progress to your normal diet. Heavy or fried foods are harder to digest and may make you feel sick to your stomach (nauseated).   Avoid alcoholic beverages for 24 hours or as instructed.  MEDICATIONS  You may resume your normal medications unless your doctor tells you otherwise.  WHAT YOU CAN EXPECT TODAY  Some feelings of bloating in the abdomen.   Passage of more gas than usual.   Spotting of blood in your stool or on the toilet paper.  IF YOU HAD POLYPS REMOVED DURING THE COLONOSCOPY:  No aspirin products for 7 days or as instructed.   No alcohol for 7 days or as instructed.   Eat a soft diet for the next 24 hours.  FINDING OUT THE RESULTS OF YOUR TEST Not all test results are available during your visit. If your test results are not back during the visit, make an appointment  with your caregiver to find out the results. Do not assume everything is normal if you have not heard from your caregiver or the medical facility. It is important for you to follow up on all of your test results.  SEEK IMMEDIATE MEDICAL ATTENTION IF:  You have more than a spotting of blood in your stool.   Your belly is swollen (abdominal distention).   You are nauseated or vomiting.   You have a temperature over 101.   You have abdominal pain or discomfort that is severe or gets worse throughout the day.    Diverticulosis and colon polyp information provided  Further recommendations to follow pending review of pathology report    Colonoscopy, Adult, Care After This sheet gives you information about how to care for yourself after your procedure. Your doctor may also give you more specific instructions. If you have problems or questions, call your doctor. Follow these instructions at home: General instructions   For the first 24 hours after the procedure: ? Do not drive or use machinery. ? Do not sign important documents. ? Do not drink alcohol. ? Do your daily activities more slowly than normal. ? Eat foods that are soft and easy to digest. ? Rest often.  Take over-the-counter or prescription medicines only as told by your doctor.  It is up to you to get the results of your procedure. Ask your doctor, or the department performing the procedure, when your results will  be ready. To help cramping and bloating:  Try walking around.  Put heat on your belly (abdomen) as told by your doctor. Use a heat source that your doctor recommends, such as a moist heat pack or a heating pad. ? Put a towel between your skin and the heat source. ? Leave the heat on for 20-30 minutes. ? Remove the heat if your skin turns bright red. This is especially important if you cannot feel pain, heat, or cold. You can get burned. Eating and drinking  Drink enough fluid to keep your pee (urine) clear  or pale yellow.  Return to your normal diet as told by your doctor. Avoid heavy or fried foods that are hard to digest.  Avoid drinking alcohol for as long as told by your doctor. Contact a doctor if:  You have blood in your poop (stool) 2-3 days after the procedure. Get help right away if:  You have more than a small amount of blood in your poop.  You see large clumps of tissue (blood clots) in your poop.  Your belly is swollen.  You feel sick to your stomach (nauseous).  You throw up (vomit).  You have a fever.  You have belly pain that gets worse, and medicine does not help your pain. This information is not intended to replace advice given to you by your health care provider. Make sure you discuss any questions you have with your health care provider.   Colon Polyps Polyps are tissue growths inside the body. Polyps can grow in many places, including the large intestine (colon). A polyp may be a round bump or a mushroom-shaped growth. You could have one polyp or several. Most colon polyps are noncancerous (benign). However, some colon polyps can become cancerous over time. What are the causes? The exact cause of colon polyps is not known. What increases the risk? This condition is more likely to develop in people who:  Have a family history of colon cancer or colon polyps.  Are older than 80 or older than 45 if they are African American.  Have inflammatory bowel disease, such as ulcerative colitis or Crohn disease.  Are overweight.  Smoke cigarettes.  Do not get enough exercise.  Drink too much alcohol.  Eat a diet that is: ? High in fat and red meat. ? Low in fiber.  Had childhood cancer that was treated with abdominal radiation.  What are the signs or symptoms? Most polyps do not cause symptoms. If you have symptoms, they may include:  Blood coming from your rectum when having a bowel movement.  Blood in your stool.The stool may look dark red or  black.  A change in bowel habits, such as constipation or diarrhea.  How is this diagnosed? This condition is diagnosed with a colonoscopy. This is a procedure that uses a lighted, flexible scope to look at the inside of your colon. How is this treated? Treatment for this condition involves removing any polyps that are found. Those polyps will then be tested for cancer. If cancer is found, your health care provider will talk to you about options for colon cancer treatment. Follow these instructions at home: Diet  Eat plenty of fiber, such as fruits, vegetables, and whole grains.  Eat foods that are high in calcium and vitamin D, such as milk, cheese, yogurt, eggs, liver, fish, and broccoli.  Limit foods high in fat, red meats, and processed meats, such as hot dogs, sausage, bacon, and lunch meats.  Maintain  a healthy weight, or lose weight if recommended by your health care provider. General instructions  Do not smoke cigarettes.  Do not drink alcohol excessively.  Keep all follow-up visits as told by your health care provider. This is important. This includes keeping regularly scheduled colonoscopies. Talk to your health care provider about when you need a colonoscopy.  Exercise every day or as told by your health care provider. Contact a health care provider if:  You have new or worsening bleeding during a bowel movement.  You have new or increased blood in your stool.  You have a change in bowel habits.  You unexpectedly lose weight. This information is not intended to replace advice given to you by your health care provider. Make sure you discuss any questions you have with your health care provider.   Diverticulosis Diverticulosis is a condition that develops when small pouches (diverticula) form in the [place] of the large intestine (colon). The colon is where water is absorbed and stool is formed. The pouches form when the inside layer of the colon pushes through weak  spots in the outer layers of the colon. You may have a few pouches or many of them. What are the causes? The cause of this condition is not known. What increases the risk? The following factors may make you more likely to develop this condition:  Being older than age 46. Your risk for this condition increases with age. Diverticulosis is rare among people younger than age 38. By age 58, many people have it.  Eating a low-fiber diet.  Having frequent constipation.  Being overweight.  Not getting enough exercise.  Smoking.  Taking over-the-counter pain medicines, like aspirin and ibuprofen.  Having a family history of diverticulosis.  What are the signs or symptoms? In most people, there are no symptoms of this condition. If you do have symptoms, they may include:  Bloating.  Cramps in the abdomen.  Constipation or diarrhea.  Pain in the lower left side of the abdomen.  How is this diagnosed? This condition is most often diagnosed during an exam for other colon problems. Because diverticulosis usually has no symptoms, it often cannot be diagnosed independently. This condition may be diagnosed by:  Using a flexible scope to examine the colon (colonoscopy).  Taking an X-ray of the colon after dye has been put into the colon (barium enema).  Doing a CT scan.  How is this treated? You may not need treatment for this condition if you have never developed an infection related to diverticulosis. If you have had an infection before, treatment may include:  Eating a high-fiber diet. This may include eating more fruits, vegetables, and grains.  Taking a fiber supplement.  Taking a live bacteria supplement (probiotic).  Taking medicine to relax your colon.  Taking antibiotic medicines.  Follow these instructions at home:  Drink 6-8 glasses of water or more each day to prevent constipation.  Try not to strain when you have a bowel movement.  If you have had an infection  before: ? Eat more fiber as directed by your health care provider or your diet and nutrition specialist (dietitian). ? Take a fiber supplement or probiotic, if your health care provider approves.  Take over-the-counter and prescription medicines only as told by your health care provider.  If you were prescribed an antibiotic, take it as told by your health care provider. Do not stop taking the antibiotic even if you start to feel better.  Keep all follow-up visits  as told by your health care provider. This is important. Contact a health care provider if:  You have pain in your abdomen.  You have bloating.  You have cramps.  You have not had a bowel movement in 3 days. Get help right away if:  Your pain gets worse.  Your bloating becomes very bad.  You have a fever or chills, and your symptoms suddenly get worse.  You vomit.  You have bowel movements that are bloody or black.  You have bleeding from your rectum. Summary  Diverticulosis is a condition that develops when small pouches (diverticula) form in the [place] of the large intestine (colon).  You may have a few pouches or many of them.  This condition is most often diagnosed during an exam for other colon problems.  If you have had an infection related to diverticulosis, treatment may include increasing the fiber in your diet, taking supplements, or taking medicines. This information is not intended to replace advice given to you by your health care provider. Make sure you discuss any questions you have with your health care provider.

## 2017-09-04 NOTE — Op Note (Signed)
[facility] Patient Name: Jeanette Aguirre Procedure Date: 09/04/2017 8:35 AM MRN: [id number] Date of Birth: Oct 14, 1960 Attending MD: Norvel Richards , MD CSN: [id number] Age: 56 Admit Type: Outpatient Procedure:                Colonoscopy Indications:              Screening for colorectal malignant neoplasm Providers:                Norvel Richards, MD, Janeece Riggers, RN, Nelma Rothman, Technician, Randa Spike, Technician Referring MD:              Medicines:                Midazolam 6 mg IV, Meperidine 950 mg IV Complications:            No immediate complications. Estimated Blood Loss:     Estimated blood loss: none. Procedure:                Pre-Anesthesia Assessment:                           - Prior to the procedure, a History and Physical                            was performed, and patient medications and                            allergies were reviewed. The patient's tolerance of                            previous anesthesia was also reviewed. The risks                            and benefits of the procedure and the sedation                            options and risks were discussed with the patient.                            All questions were answered, and informed consent                            was obtained. Prior Anticoagulants: The patient has                            taken no previous anticoagulant or antiplatelet                            agents. ASA Grade Assessment: II - A patient with                            mild systemic disease. After reviewing the risks  and benefits, the patient was deemed in                            satisfactory condition to undergo the procedure.                           After obtaining informed consent, the colonoscope                            was passed under direct vision. Throughout the                            procedure, the patient's blood pressure, pulse,  and                            oxygen saturations were monitored continuously. The                            EC-[id number] ([id number]) scope was introduced through                            the anus and advanced to the the cecum, identified                            by appendiceal orifice and ileocecal valve. The                            colonoscopy was performed without difficulty. The                            patient tolerated the procedure well. The entire                            colon was well visualized. The quality of the bowel                            preparation was adequate. The ileocecal valve,                            appendiceal orifice, and rectum were photographed. Scope In: 9:08:34 AM Scope Out: 9:35:15 AM Scope Withdrawal Time: 0 hours 8 minutes 20 seconds  Total Procedure Duration: 0 hours 26 minutes 41 seconds  Findings:      The perianal and digital rectal examinations were normal.      Many medium-mouthed diverticula were found in the sigmoid colon.      Three pedunculated polyps were found in the descending colon and hepatic       flexure. The polyps were 9 to 10 mm in size. These polyps were removed       with a hot snare. Resection and retrieval were complete. Estimated blood       loss: none.      The exam was otherwise without abnormality on direct and retroflexion       views. Impression:               -  Diverticulosis in the sigmoid colon.                           - Three 9 to 10 mm polyps in the descending colon                            and at the hepatic flexure, removed with a hot                            snare. Resected and retrieved.                           - The examination was otherwise normal on direct                            and retroflexion views. Moderate Sedation:      Moderate (conscious) sedation was administered by the endoscopy nurse       and supervised by the endoscopist. The following parameters were       monitored:  oxygen saturation, heart rate, blood pressure, respiratory       rate, EKG, adequacy of pulmonary ventilation, and response to care.       Total physician intraservice time was 33 minutes.      Moderate (conscious) sedation was administered by the endoscopy nurse       and supervised by the endoscopist. The following parameters were       monitored: oxygen saturation, heart rate, blood pressure, respiratory       rate, EKG, adequacy of pulmonary ventilation, and response to care.       Total physician intraservice time was 33 minutes. Recommendation:           - Patient has a contact number available for                            emergencies. The signs and symptoms of potential                            delayed complications were discussed with the                            patient. Return to normal activities tomorrow.                            Written discharge instructions were provided to the                            patient.                           - Resume previous diet.                           - Continue present medications.                           - Await pathology results.                           -  Repeat colonoscopy date to be determined after                            pending pathology results are reviewed for                            surveillance based on pathology results.                           - Return to GI clinic (date not yet determined). Procedure Code(s):        --- Professional ---                           [phone], Colonoscopy, flexible; with removal of                            tumor(s), polyp(s), or other lesion(s) by snare                            technique                           99152, Moderate sedation services provided by the                            same physician or other qualified health care                            professional performing the diagnostic or                            therapeutic service that the sedation  supports,                            requiring the presence of an independent trained                            observer to assist in the monitoring of the                            patient's level of consciousness and physiological                            status; initial 15 minutes of intraservice time,                            patient age 44 years or older                           9, Moderate sedation services provided by the                            same physician or other qualified health care  professional performing the diagnostic or                            therapeutic service that the sedation supports,                            requiring the presence of an independent trained                            observer to assist in the monitoring of the                            patient's level of consciousness and physiological                            status; initial 15 minutes of intraservice time,                            patient age 50 years or older                           [phone], Moderate sedation services; each additional                            15 minutes intraservice time                           [phone], Moderate sedation services; each additional                            15 minutes intraservice time Diagnosis Code(s):        --- Professional ---                           Z12.11, Encounter for screening for malignant                            neoplasm of colon                           D12.4, Benign neoplasm of descending colon                           D12.3, Benign neoplasm of transverse colon (hepatic                            flexure or splenic flexure)                           K57.30, Diverticulosis of large intestine without                            perforation or abscess without bleeding CPT copyright 2016 [employer]. All rights reserved. The codes documented in this report are preliminary and  upon coder review may  be revised to meet current compliance requirements. Cristopher Estimable.  Kemi Gell, MD Norvel Richards, MD 09/04/2017 9:43:00 AM This report has been signed electronically. Number of Addenda: 0

## 2017-09-08 ENCOUNTER — Encounter (HOSPITAL_COMMUNITY): Payer: Self-pay | Admitting: Internal Medicine

## 2017-09-09 ENCOUNTER — Encounter: Payer: Self-pay | Admitting: Internal Medicine

## 2017-09-21 ENCOUNTER — Other Ambulatory Visit (HOSPITAL_COMMUNITY): Payer: Self-pay | Admitting: Family Medicine

## 2017-09-21 DIAGNOSIS — J189 Pneumonia, unspecified organism: Secondary | ICD-10-CM

## 2017-10-01 ENCOUNTER — Ambulatory Visit (HOSPITAL_COMMUNITY): Payer: PRIVATE HEALTH INSURANCE

## 2017-10-07 ENCOUNTER — Other Ambulatory Visit (HOSPITAL_COMMUNITY): Payer: Self-pay | Admitting: Family Medicine

## 2017-10-07 ENCOUNTER — Ambulatory Visit (HOSPITAL_COMMUNITY)
Admission: RE | Admit: 2017-10-07 | Discharge: 2017-10-07 | Disposition: A | Discharge status: Home / Self Care | Payer: PRIVATE HEALTH INSURANCE | Source: Ambulatory Visit | Attending: Family Medicine | Admitting: Family Medicine

## 2017-10-07 DIAGNOSIS — R112 Nausea with vomiting, unspecified: Secondary | ICD-10-CM | POA: Insufficient documentation

## 2017-10-07 DIAGNOSIS — K5792 Diverticulitis of intestine, part unspecified, without perforation or abscess without bleeding: Secondary | ICD-10-CM

## 2017-10-07 DIAGNOSIS — R160 Hepatomegaly, not elsewhere classified: Secondary | ICD-10-CM | POA: Diagnosis not present

## 2017-10-07 DIAGNOSIS — R197 Diarrhea, unspecified: Secondary | ICD-10-CM | POA: Diagnosis not present

## 2017-10-07 DIAGNOSIS — J189 Pneumonia, unspecified organism: Secondary | ICD-10-CM | POA: Diagnosis not present

## 2017-10-07 DIAGNOSIS — J439 Emphysema, unspecified: Secondary | ICD-10-CM | POA: Insufficient documentation

## 2017-10-07 LAB — POCT I-STAT CREATININE: Creatinine, Ser: 0.6 mg/dL (ref 0.44–1.00)

## 2017-10-07 MED ORDER — IOPAMIDOL (ISOVUE-300) INJECTION 61%
100.0000 mL | Freq: Once | INTRAVENOUS | Status: AC | PRN
  Administered 2017-10-07: 100 mL via INTRAVENOUS

## 2017-10-12 ENCOUNTER — Ambulatory Visit (HOSPITAL_COMMUNITY): Payer: PRIVATE HEALTH INSURANCE

## 2017-10-15 ENCOUNTER — Encounter: Payer: Self-pay | Admitting: Internal Medicine

## 2017-11-27 ENCOUNTER — Encounter: Payer: Self-pay | Admitting: Gastroenterology

## 2017-11-27 ENCOUNTER — Other Ambulatory Visit (HOSPITAL_COMMUNITY)
Admission: RE | Admit: 2017-11-27 | Discharge: 2017-11-27 | Disposition: A | Discharge status: Home / Self Care | Payer: PRIVATE HEALTH INSURANCE | Source: Ambulatory Visit | Attending: Gastroenterology | Admitting: Gastroenterology

## 2017-11-27 ENCOUNTER — Ambulatory Visit (INDEPENDENT_AMBULATORY_CARE_PROVIDER_SITE_OTHER): Payer: PRIVATE HEALTH INSURANCE | Admitting: Gastroenterology

## 2017-11-27 VITALS — BP 126/75 | HR 66 | Temp 97.7°F | Ht 66.0 in | Wt 258.0 lb

## 2017-11-27 DIAGNOSIS — K529 Noninfective gastroenteritis and colitis, unspecified: Secondary | ICD-10-CM

## 2017-11-27 DIAGNOSIS — R112 Nausea with vomiting, unspecified: Secondary | ICD-10-CM | POA: Diagnosis not present

## 2017-11-27 DIAGNOSIS — R160 Hepatomegaly, not elsewhere classified: Secondary | ICD-10-CM | POA: Diagnosis not present

## 2017-11-27 LAB — CBC WITH DIFFERENTIAL/PLATELET
Basophils Absolute: 0 10*3/uL (ref 0.0–0.1)
Basophils Relative: 0 %
EOS PCT: 2 %
Eosinophils Absolute: 0.2 10*3/uL (ref 0.0–0.7)
HEMATOCRIT: 37.3 % (ref 36.0–46.0)
HEMOGLOBIN: 11.7 g/dL — AB (ref 12.0–15.0)
LYMPHS ABS: 3.7 10*3/uL (ref 0.7–4.0)
Lymphocytes Relative: 31 %
MCH: 28.1 pg (ref 26.0–34.0)
MCHC: 31.4 g/dL (ref 30.0–36.0)
MCV: 89.4 fL (ref 78.0–100.0)
Monocytes Absolute: 0.5 10*3/uL (ref 0.1–1.0)
Monocytes Relative: 4 %
NEUTROS ABS: 7.4 10*3/uL (ref 1.7–7.7)
Neutrophils Relative %: 63 %
PLATELETS: 297 10*3/uL (ref 150–400)
RBC: 4.17 MIL/uL (ref 3.87–5.11)
RDW: 13.1 % (ref 11.5–15.5)
WBC: 11.8 10*3/uL — AB (ref 4.0–10.5)

## 2017-11-27 NOTE — Assessment & Plan Note (Signed)
Possibly related to bile acid diarrhea. Patient tells me she was provided with medications such as cholestyramine but never took it. She actually has at home. Advised trying once daily. She will have labs to screen for celiac disease. If at some point, her diarrhea was not well managed and no clear etiology, could consider random colon bx.

## 2017-11-27 NOTE — Progress Notes (Signed)
ON RECALL  °

## 2017-11-27 NOTE — Progress Notes (Signed)
Primary Care Physician: Redmond School, MD  Primary Gastroenterologist:  Garfield Cornea, MD   Chief Complaint  Patient presents with  . Diarrhea    about twice per day  . Emesis    periodically    HPI: Jeanette Aguirre is a 57 y.o. female here at the request of PCP for further evaluation of N/V, diarrhea.   Patient was seen back in December for screening colonoscopy. At the time she was found to have diverticulosis and several tubular and new meds removed. She is due for surveillance colonoscopy in three years. Unfortunately when she was triaged, she downplayed her diarrhea.  She presents today stating she's had diarrhea for years, ever since her gallbladder surgery. Stools occur within a few minutes of eating. She has 2 to 3 loose stools daily. Rare solid stools. No nocturnal diarrhea. Recently wakes up in the middle of the night with metallic taste and knows she will have vomiting. Intermittent in nature. Has tried times when she feels it, knowing that it doesn't help. Cannot figure out any pattern with a particular food. Symptoms have been occurring for a year however she's had none in the past two months.No significant heartburn. No melena, brbpr. Recent amoxicillin for dental issues but no worsening diarrhea.  Daughter, age 10, chronic lymphocytic colitis. Son, age 3 with diarrhea issues.   Patient has had four CT scans since October. CT abdomen and pelvis with contrast along with CTA chest when she presented to ED with cough and leukocytosis. She had PNA and acute bronchitis and borderline enlarged liver, fatty liver.  Chest CT to follow-up in January for f/u PNA along with CT abd for abd pain showed left lower lobe pneumonia had resolved and minimal upper lobe diffuse micronodularity likely smoking induced, liver enlarged measuring 21cm.   Used to weigh over 50 pounds over the years. Walks about 2 miles daily at work. He has had diabetes for a few years.   Current Outpatient  Medications  Medication Sig Dispense Refill  . acetaminophen (TYLENOL) 500 MG tablet Take 1,000 mg by mouth every 6 (six) hours as needed for moderate pain or headache.     Marland Kitchen atorvastatin (LIPITOR) 20 MG tablet Take 20 mg by mouth at bedtime.     Marland Kitchen FLUoxetine (PROZAC) 20 MG tablet Take 30 mg by mouth daily.    Marland Kitchen glimepiride (AMARYL) 4 MG tablet Take 4 mg by mouth daily with breakfast.    . ibuprofen (ADVIL,MOTRIN) 200 MG tablet Take 400 mg by mouth every 6 (six) hours as needed for headache or moderate pain.    Marland Kitchen lisinopril (PRINIVIL,ZESTRIL) 2.5 MG tablet Take 2.5 mg by mouth daily.    . naproxen sodium (ALEVE) 220 MG tablet Take 220 mg by mouth 2 (two) times daily as needed (for pain or headaches).    . sitaGLIPtin-metformin (JANUMET) 50-1000 MG tablet Take 1 tablet by mouth 2 (two) times daily with a meal.    . benzonatate (TESSALON) 100 MG capsule Take 1 capsule (100 mg total) by mouth every 8 (eight) hours. (Patient not taking: Reported on 07/22/2017) 21 capsule 0  . megestrol (MEGACE) 40 MG tablet TAKE ONE TABLET BY MOUTH ONCE DAILY (Patient not taking: Reported on 10/01/2016) 30 tablet 0  . polyethylene glycol-electrolytes (TRILYTE) 420 g solution Take 4,000 mLs as directed by mouth. (Patient not taking: Reported on 11/27/2017) 4000 mL 0   No current facility-administered medications for this visit.     Allergies as of 11/27/2017 -  Review Complete 11/27/2017  Allergen Reaction Noted  . Betadine [povidone iodine] Swelling 10/01/2016  . Ciprofloxacin  11/27/2017    ROS:  General: Negative for anorexia, weight loss, fever, chills, fatigue, weakness. ENT: Negative for hoarseness, difficulty swallowing , nasal congestion. CV: Negative for chest pain, angina, palpitations, dyspnea on exertion, peripheral edema.  Respiratory: Negative for dyspnea at rest, dyspnea on exertion, cough, sputum, wheezing.  GI: See history of present illness. GU:  Negative for dysuria, hematuria, urinary  incontinence, urinary frequency, nocturnal urination.  Endo: Negative for unusual weight change.    Physical Examination:   BP 126/75   Pulse 66   Temp 97.7 F (36.5 C) (Oral)   Ht 5\' 6"  (1.676 m)   Wt 258 lb (117 kg)   LMP 03/03/2012   BMI 41.64 kg/m   General: Well-nourished, well-developed in no acute distress.  Eyes: No icterus. Mouth: Oropharyngeal mucosa moist and pink , no lesions erythema or exudate. Lungs: Clear to auscultation bilaterally.  Heart: Regular rate and rhythm, no murmurs rubs or gallops.  Abdomen: Bowel sounds are normal, nontender, nondistended, no hepatosplenomegaly or masses, no abdominal bruits or hernia , no rebound or guarding. Exam limited by body habitus Extremities: No lower extremity edema. No clubbing or deformities. Neuro: Alert and oriented x 4   Skin: Warm and dry, no jaundice.   Psych: Alert and cooperative, normal mood and affect.  Labs:  Lab Results  Component Value Date   WBC 19.3 (H) 07/03/2017   HGB 11.9 (L) 07/03/2017   HCT 36.7 07/03/2017   MCV 87.2 07/03/2017   PLT 290 07/03/2017   Lab Results  Component Value Date   CREATININE 0.60 10/07/2017   BUN 10 07/03/2017   NA 135 07/03/2017   K 3.7 07/03/2017   CL 98 (L) 07/03/2017   CO2 25 07/03/2017   Lab Results  Component Value Date   ALT 17 07/03/2017   AST 18 07/03/2017   ALKPHOS 112 07/03/2017   BILITOT 1.0 07/03/2017   Lab Results  Component Value Date   LIPASE 18 07/03/2017    Imaging Studies: No results found.

## 2017-11-27 NOTE — Assessment & Plan Note (Signed)
Hepatomegaly with fatty liver seen on imaging. Risk factors include obesity and diabetes. Encouraged modest weight loss, daily exercise, tight glycemic control. She should have LFTs drawn at least once yearly and consider liver ultrasound if increase in LFTs at some point in the future. Recommend office visit one year.

## 2017-11-27 NOTE — Progress Notes (Signed)
Patient needs f/u in one year for fatty liver.

## 2017-11-27 NOTE — Assessment & Plan Note (Signed)
Possibly atypical GERD. Predominantly nocturnal symptoms. Currently asymptomatic. She will call with recurrent symptoms, at that time we consider daily PPI. For now we will go ahead and check H. Pylori serologies.

## 2017-11-27 NOTE — Patient Instructions (Addendum)
1. Please have your labs done. We will contact you within 5-7 business days once resulted.  2. Try the cholesterol powder you were given by your PCP for the diarrhea. Make sure you do not take within 2 hours of your other medications. Consider starting at one dose daily. Hold for constipation.  3. Make sure you are staying on top of your diabetes, get as much exercise as you can and try to drop 10-15 pounds over the next 3 months. This is important for your health overall but can be very critical in decreasing your risk of developing permanent liver disease.     Fatty Liver Fatty liver, also called hepatic steatosis or steatohepatitis, is a condition in which too much fat has built up in your liver cells. The liver removes harmful substances from your bloodstream. It produces fluids your body needs. It also helps your body use and store energy from the food you eat. In many cases, fatty liver does not cause symptoms or problems. It is often diagnosed when tests are being done for other reasons. However, over time, fatty liver can cause inflammation that may lead to more serious liver problems, such as scarring of the liver (cirrhosis). What are the causes? Causes of fatty liver may include:  Drinking too much alcohol.  Poor nutrition.  Obesity.  Cushing syndrome.  Diabetes.  Hyperlipidemia.  Pregnancy.  Certain drugs.  Poisons.  Some viral infections.  What increases the risk? You may be more likely to develop fatty liver if you:  Abuse alcohol.  Are pregnant.  Are overweight.  Have diabetes.  Have hepatitis.  Have a high triglyceride level.  What are the signs or symptoms? Fatty liver often does not cause any symptoms. In cases where symptoms develop, they can include:  Fatigue.  Weakness.  Weight loss.  Confusion.  Abdominal pain.  Yellowing of your skin and the white parts of your eyes (jaundice).  Nausea and vomiting.  How is this diagnosed? Fatty  liver may be diagnosed by:  Physical exam and medical history.  Blood tests.  Imaging tests, such as an ultrasound, CT scan, or MRI.  Liver biopsy. A small sample of liver tissue is removed using a needle. The sample is then looked at under a microscope.  How is this treated? Fatty liver is often caused by other health conditions. Treatment for fatty liver may involve medicines and lifestyle changes to manage conditions such as:  Alcoholism.  High cholesterol.  Diabetes.  Being overweight or obese.  Follow these instructions at home:  Eat a healthy diet as directed by your health care provider.  Exercise regularly. This can help you lose weight and control your cholesterol and diabetes. Talk to your health care provider about an exercise plan and which activities are best for you.  Do not drink alcohol.  Take medicines only as directed by your health care provider. Contact a health care provider if: You have difficulty controlling your:  Blood sugar.  Cholesterol.  Alcohol consumption.  Get help right away if:  You have abdominal pain.  You have jaundice.  You have nausea and vomiting. This information is not intended to replace advice given to you by your health care provider. Make sure you discuss any questions you have with your health care provider. Document Released: 10/24/2005 Document Revised: 02/14/2016 Document Reviewed: 01/18/2014 Elsevier Interactive Patient Education  2018 [employer].  Nonalcoholic Fatty Liver Disease Diet Nonalcoholic fatty liver disease is a condition that causes fat to accumulate  in and around the liver. The disease makes it harder for the liver to work the way that it should. Following a healthy diet can help to keep nonalcoholic fatty liver disease under control. It can also help to prevent or improve conditions that are associated with the disease, such as heart disease, diabetes, high blood pressure, and abnormal cholesterol  levels. Along with regular exercise, this diet:  Promotes weight loss.  Helps to control blood sugar levels.  Helps to improve the way that the body uses insulin.  What do I need to know about this diet?  Use the glycemic index (GI) to plan your meals. The index tells you how quickly a food will raise your blood sugar. Choose low-GI foods. These foods take a longer time to raise blood sugar.  Keep track of how many calories you take in. Eating the right amount of calories will help you to achieve a healthy weight.  You may want to follow a Mediterranean diet. This diet includes a lot of vegetables, lean meats or fish, whole grains, fruits, and healthy oils and fats. What foods can I eat? Grains Whole grains, such as whole-wheat or whole-grain breads, crackers, tortillas, cereals, and pasta. Stone-ground whole wheat. Pumpernickel bread. Unsweetened oatmeal. Bulgur. Barley. Quinoa. Brown or wild rice. Corn or whole-wheat flour tortillas. Vegetables Lettuce. Spinach. Peas. Beets. Cauliflower. Cabbage. Broccoli. Carrots. Tomatoes. Squash. Eggplant. Herbs. Peppers. Onions. Cucumbers. Brussels sprouts. Yams and sweet potatoes. Beans. Lentils. Fruits Bananas. Apples. Oranges. Grapes. Papaya. Mango. Pomegranate. Kiwi. Grapefruit. Cherries. Meats and Other Protein Sources Seafood and shellfish. Lean meats. Poultry. Tofu. Dairy Low-fat or fat-free dairy products, such as yogurt, cottage cheese, and cheese. Beverages Water. Sugar-free drinks. Tea. Coffee. Low-fat or skim milk. Milk alternatives, such as soy or almond milk. Real fruit juice. Condiments Mustard. Relish. Low-fat, low-sugar ketchup and barbecue sauce. Low-fat or fat-free mayonnaise. Sweets and Desserts Sugar-free sweets. Fats and Oils Avocado. Canola or olive oil. Nuts and nut butters. Seeds. The items listed above may not be a complete list of recommended foods or beverages. Contact your dietitian for more options. What foods  are not recommended? Palm oil and coconut oil. Processed foods. Fried foods. Sweetened drinks, such as sweet tea, milkshakes, snow cones, iced sweet drinks, and sodas. Alcohol. Sweets. Foods that contain a lot of salt or sodium. The items listed above may not be a complete list of foods and beverages to avoid. Contact your dietitian for more information. This information is not intended to replace advice given to you by your health care provider. Make sure you discuss any questions you have with your health care provider. Document Released: 01/23/2015 Document Revised: 02/14/2016 Document Reviewed: 10/03/2014 Elsevier Interactive Patient Education  [employer].

## 2017-11-28 LAB — IGA: IGA: 184 mg/dL (ref 87–352)

## 2017-11-29 LAB — TISSUE TRANSGLUTAMINASE, IGA

## 2017-11-30 ENCOUNTER — Telehealth: Payer: Self-pay | Admitting: Internal Medicine

## 2017-11-30 LAB — H. PYLORI ANTIBODY, IGG

## 2017-11-30 NOTE — Progress Notes (Signed)
CC'D TO PCP °

## 2017-12-07 NOTE — Progress Notes (Signed)
Please let patient know her H.pylori serologies were negative for bacteria in the stomach that causes ulcers. Her celiac screen was negative.   Her WBC is still slightly elevated but much improved from 06/2017. Her Hgb slightly low but HCT normal.   At this time I would advise plan as outlined at time of OV.   Recheck her CBC with diff in 3 months. Call if diarrhea not improved with medication.

## 2017-12-08 ENCOUNTER — Other Ambulatory Visit: Payer: Self-pay

## 2017-12-08 DIAGNOSIS — D72829 Elevated white blood cell count, unspecified: Secondary | ICD-10-CM

## 2018-02-10 ENCOUNTER — Other Ambulatory Visit: Payer: Self-pay

## 2018-02-10 DIAGNOSIS — D72829 Elevated white blood cell count, unspecified: Secondary | ICD-10-CM

## 2018-02-17 ENCOUNTER — Encounter: Payer: Self-pay | Admitting: Gastroenterology

## 2018-03-01 ENCOUNTER — Encounter: Payer: Self-pay | Admitting: Gastroenterology

## 2018-03-09 ENCOUNTER — Encounter: Payer: Self-pay | Admitting: Gastroenterology

## 2018-03-10 ENCOUNTER — Other Ambulatory Visit (HOSPITAL_COMMUNITY)
Admission: RE | Admit: 2018-03-10 | Discharge: 2018-03-10 | Disposition: A | Discharge status: Home / Self Care | Payer: PRIVATE HEALTH INSURANCE | Source: Ambulatory Visit | Attending: Gastroenterology | Admitting: Gastroenterology

## 2018-03-10 ENCOUNTER — Encounter: Payer: Self-pay | Admitting: Gastroenterology

## 2018-03-10 DIAGNOSIS — D72829 Elevated white blood cell count, unspecified: Secondary | ICD-10-CM | POA: Diagnosis present

## 2018-03-10 LAB — CBC WITH DIFFERENTIAL/PLATELET
Basophils Absolute: 0 10*3/uL (ref 0.0–0.1)
Basophils Relative: 0 %
Eosinophils Absolute: 0.4 10*3/uL (ref 0.0–0.7)
Eosinophils Relative: 4 %
HEMATOCRIT: 35.4 % — AB (ref 36.0–46.0)
Hemoglobin: 11.1 g/dL — ABNORMAL LOW (ref 12.0–15.0)
LYMPHS ABS: 2.7 10*3/uL (ref 0.7–4.0)
LYMPHS PCT: 27 %
MCH: 27.5 pg (ref 26.0–34.0)
MCHC: 31.4 g/dL (ref 30.0–36.0)
MCV: 87.8 fL (ref 78.0–100.0)
MONO ABS: 0.3 10*3/uL (ref 0.1–1.0)
MONOS PCT: 3 %
NEUTROS ABS: 6.6 10*3/uL (ref 1.7–7.7)
Neutrophils Relative %: 66 %
Platelets: 295 10*3/uL (ref 150–400)
RBC: 4.03 MIL/uL (ref 3.87–5.11)
RDW: 12.9 % (ref 11.5–15.5)
WBC: 10 10*3/uL (ref 4.0–10.5)

## 2018-03-16 ENCOUNTER — Encounter: Payer: Self-pay | Admitting: Gastroenterology

## 2018-03-17 ENCOUNTER — Telehealth: Payer: Self-pay | Admitting: Internal Medicine

## 2018-03-17 NOTE — Telephone Encounter (Signed)
[phone]   Patient said LSL wanted her to call today and speak to [place] or a cma please call

## 2018-03-17 NOTE — Progress Notes (Signed)
PATIENT SCHEDULED  °

## 2018-03-17 NOTE — Telephone Encounter (Signed)
Spoke with pt, appt scheduled for 03/18/18 per LSL for vomiting.

## 2018-03-18 ENCOUNTER — Other Ambulatory Visit: Payer: Self-pay

## 2018-03-18 ENCOUNTER — Other Ambulatory Visit (HOSPITAL_COMMUNITY)
Admission: RE | Admit: 2018-03-18 | Discharge: 2018-03-18 | Disposition: A | Discharge status: Home / Self Care | Payer: PRIVATE HEALTH INSURANCE | Source: Ambulatory Visit | Attending: Gastroenterology | Admitting: Gastroenterology

## 2018-03-18 ENCOUNTER — Encounter: Payer: Self-pay | Admitting: Gastroenterology

## 2018-03-18 ENCOUNTER — Ambulatory Visit (INDEPENDENT_AMBULATORY_CARE_PROVIDER_SITE_OTHER): Payer: PRIVATE HEALTH INSURANCE | Admitting: Gastroenterology

## 2018-03-18 VITALS — BP 126/75 | HR 71 | Temp 97.2°F | Ht 66.0 in | Wt 262.0 lb

## 2018-03-18 DIAGNOSIS — D649 Anemia, unspecified: Secondary | ICD-10-CM | POA: Insufficient documentation

## 2018-03-18 DIAGNOSIS — R112 Nausea with vomiting, unspecified: Secondary | ICD-10-CM | POA: Diagnosis not present

## 2018-03-18 LAB — LIPID PANEL
CHOLESTEROL: 147 mg/dL (ref 0–200)
HDL: 45 mg/dL (ref >40)
LDL CALC: 58 mg/dL (ref 0–99)
Total CHOL/HDL Ratio: 3.3 RATIO
Triglycerides: 220 mg/dL — ABNORMAL HIGH (ref <150)
VLDL: 44 mg/dL — ABNORMAL HIGH (ref 0–40)

## 2018-03-18 LAB — HEMOGLOBIN A1C
Hgb A1c MFr Bld: 8.9 % — ABNORMAL HIGH (ref 4.8–5.6)
MEAN PLASMA GLUCOSE: 208.73 mg/dL

## 2018-03-18 LAB — IRON AND TIBC
IRON: 41 ug/dL (ref 28–170)
SATURATION RATIOS: 11 % (ref 10.4–31.8)
TIBC: 368 ug/dL (ref 250–450)
UIBC: 327 ug/dL

## 2018-03-18 LAB — FERRITIN: Ferritin: 31 ng/mL (ref 11–307)

## 2018-03-18 MED ORDER — PANTOPRAZOLE SODIUM 40 MG PO TBEC: 40.0000 mg | DELAYED_RELEASE_TABLET | Freq: Every day | ORAL | 3 refills | Status: DC

## 2018-03-18 NOTE — Patient Instructions (Signed)
I would like for you to stop taking Prilosec. I have sent in Protonix to take 30 minutes before breakfast every day. This works best on an Systems developer. By about 2 weeks, it should have full effect.   I have ordered blood work today. We will send the results to your primary care. When you see them, ask them about possible murmur. It was very faint and did not sound concerning. They can further evaluate it if they appreciate it on exam.  We have arranged an upper endoscopy with Dr. Gala Romney in the near future.  We will see you again in 4 months!  It was a pleasure to see you today. I strive to create trusting relationships with patients to provide genuine, compassionate, and quality care. I value your feedback. If you receive a survey regarding your visit,  I greatly appreciate you taking time to fill this out.   Annitta Needs, PhD, ANP-BC [facility]

## 2018-03-18 NOTE — H&P (View-Only) (Signed)
Referring Provider: Redmond School, MD Primary Care Physician:  Redmond School, MD  Primary GI: Dr. Gala Romney   Chief Complaint  Patient presents with  . Nausea    w/ vomiting. Happens randomly  . Bloated  . Diarrhea    daily atleast 1 time    HPI:   Jeanette Aguirre is a 57 y.o. female presenting today with a history of N/V, diarrhea. Known hepatomegaly in setting of fatty liver. No splenomegaly. Colonoscopy on file from Dec 2018. Last seen March 2019. Chronic diarrhea since cholecystectomy in remote past. Felt to have GERD symptoms at last visit. Discussion for possible PPI if persistent intermittent reflux. She messaged on MyChart in May 2019 with persistent symptoms. She wanted to just monitor. She continued to have bothersome symptoms, so she was brought in. Recent CBC with hgb 11.1. Previously upper 11 range and 1 year ago normal. Negative celiac screen.   Weight 258 March 2019. She is up to 262 today. No weight loss recently. Notes periodic episodes of vomiting, no rhyme or reason. Kept a diary at first but then saw now relation with food choices. Will have dry heaves as well. Last episode a few weeks ago. Went months without anything. Has been awakened in middle of night with N/V. Has vomited at work. Can't pinpoint triggers. No underlying nausea. Can tell it's coming when she starts burping and gets a "funny" taste in her mouth. Will start burping differently than normal, then subsequently have an episode at some point that day later. No dysphagia. Occasional reflux but not all the time. Taking Prilosec in evenings before going to bed, taking at 8 or 9 after dinner. Occasional early satiety. Feels bloated. Rare NSAIDs.    A1c a year ago was 9.5. I don't have a recent one; she feels it may be the same or possibly better. Has had difficulty with blood sugar control in the past.   Grandmother has history of esophageal strictures.   Past Medical History:  Diagnosis Date  . Anxiety   .  Diabetes mellitus without complication ([place])   . Hyperlipemia   . Hypertension   . Pneumonia     Past Surgical History:  Procedure Laterality Date  . CHOLECYSTECTOMY    . COLONOSCOPY N/A 09/04/2017   Dr. Gala Romney: multiple tubular adenomas removed, diverticulosis. next TCS in 3 years.   . TONSILLECTOMY    . TYMPANOSTOMY TUBE PLACEMENT      Current Outpatient Medications  Medication Sig Dispense Refill  . acetaminophen (TYLENOL) 500 MG tablet Take 1,000 mg by mouth every 6 (six) hours as needed for moderate pain or headache.     Marland Kitchen atorvastatin (LIPITOR) 20 MG tablet Take 20 mg by mouth at bedtime.     . Chlorpheniramine Maleate (ALLERGY PO) Take by mouth daily.    Marland Kitchen FLUoxetine (PROZAC) 20 MG tablet Take 30 mg by mouth daily.    Marland Kitchen glimepiride (AMARYL) 4 MG tablet Take 4 mg by mouth daily with breakfast.    . ibuprofen (ADVIL,MOTRIN) 200 MG tablet Take 400 mg by mouth every 6 (six) hours as needed for headache or moderate pain.    Marland Kitchen lisinopril (PRINIVIL,ZESTRIL) 2.5 MG tablet Take 2.5 mg by mouth daily.    . naproxen sodium (ALEVE) 220 MG tablet Take 220 mg by mouth 2 (two) times daily as needed (for pain or headaches).    Marland Kitchen omeprazole (PRILOSEC OTC) 20 MG tablet Take 20 mg by mouth daily.    . sitaGLIPtin-metformin (JANUMET) 50-1000  MG tablet Take 1 tablet by mouth 2 (two) times daily with a meal.    . pantoprazole (PROTONIX) 40 MG tablet Take 1 tablet (40 mg total) by mouth daily. Take 30 minutes before breakfast daily 90 tablet 3   No current facility-administered medications for this visit.     Allergies as of 03/18/2018 - Review Complete 03/18/2018  Allergen Reaction Noted  . Betadine [povidone iodine] Swelling 10/01/2016  . Ciprofloxacin  11/27/2017    Family History  Problem Relation Age of Onset  . Diabetes Mother   . Peripheral vascular disease Mother   . Diabetes Father   . Colitis Daughter 44       lymphocytic colitis  . Colon cancer Neg Hx     Social History    Socioeconomic History  . Marital status: Divorced    Spouse name: Not on file  . Number of children: Not on file  . Years of education: Not on file  . Highest education level: Not on file  Occupational History  . Not on file  Social Needs  . Financial resource strain: Not on file  . Food insecurity:    Worry: Not on file    Inability: Not on file  . Transportation needs:    Medical: Not on file    Non-medical: Not on file  Tobacco Use  . Smoking status: Former Smoker    Last attempt to quit: 09/22/2016    Years since quitting: 1.4  . Smokeless tobacco: Never Used  Substance and Sexual Activity  . Alcohol use: Yes    Comment: very little  . Drug use: No  . Sexual activity: Not on file  Lifestyle  . Physical activity:    Days per week: Not on file    Minutes per session: Not on file  . Stress: Not on file  Relationships  . Social connections:    Talks on phone: Not on file    Gets together: Not on file    Attends religious service: Not on file    Active member of club or organization: Not on file    Attends meetings of clubs or organizations: Not on file    Relationship status: Not on file  Other Topics Concern  . Not on file  Social History Narrative  . Not on file    Review of Systems: Gen: Denies fever, chills, anorexia. Denies fatigue, weakness, weight loss.  CV: Denies chest pain, palpitations, syncope, peripheral edema, and claudication. Resp: Denies dyspnea at rest, cough, wheezing, coughing up blood, and pleurisy. GI: see HPI  Derm: Denies rash, itching, dry skin Psych: Denies depression, anxiety, memory loss, confusion. No homicidal or suicidal ideation.  Heme: Denies bruising, bleeding, and enlarged lymph nodes.  Physical Exam: BP 126/75   Pulse 71   Temp (!) 97.2 F (36.2 C) (Oral)   Ht 5\' 6"  (1.676 m)   Wt 262 lb (118.8 kg)   LMP 03/03/2012   BMI 42.29 kg/m  General:   Alert and oriented. No distress noted. Pleasant and cooperative.  Head:   Normocephalic and atraumatic. Eyes:  Conjuctiva clear without scleral icterus. Mouth:  Oral mucosa pink and moist.  Lungs: clear bilaterally Cardiac: possible soft/faint systolic murmur  Abdomen:  +BS, soft, non-tender and non-distended. No rebound or guarding. No HSM or masses noted. Msk:  Symmetrical without gross deformities. Normal posture. Extremities:  Without edema. Neurologic:  Alert and  oriented x4 Psych:  Alert and cooperative. Normal mood and affect.  Lab Results  Component Value Date   WBC 10.0 03/10/2018   HGB 11.1 (L) 03/10/2018   HCT 35.4 (L) 03/10/2018   MCV 87.8 03/10/2018   PLT 295 03/10/2018

## 2018-03-18 NOTE — Progress Notes (Signed)
Referring Provider: Redmond School, MD Primary Care Physician:  Redmond School, MD  Primary GI: Dr. Gala Romney   Chief Complaint  Patient presents with  . Nausea    w/ vomiting. Happens randomly  . Bloated  . Diarrhea    daily atleast 1 time    HPI:   Jeanette Aguirre is a 57 y.o. female presenting today with a history of N/V, diarrhea. Known hepatomegaly in setting of fatty liver. No splenomegaly. Colonoscopy on file from Dec 2018. Last seen March 2019. Chronic diarrhea since cholecystectomy in remote past. Felt to have GERD symptoms at last visit. Discussion for possible PPI if persistent intermittent reflux. She messaged on MyChart in May 2019 with persistent symptoms. She wanted to just monitor. She continued to have bothersome symptoms, so she was brought in. Recent CBC with hgb 11.1. Previously upper 11 range and 1 year ago normal. Negative celiac screen.   Weight 258 March 2019. She is up to 262 today. No weight loss recently. Notes periodic episodes of vomiting, no rhyme or reason. Kept a diary at first but then saw now relation with food choices. Will have dry heaves as well. Last episode a few weeks ago. Went months without anything. Has been awakened in middle of night with N/V. Has vomited at work. Can't pinpoint triggers. No underlying nausea. Can tell it's coming when she starts burping and gets a "funny" taste in her mouth. Will start burping differently than normal, then subsequently have an episode at some point that day later. No dysphagia. Occasional reflux but not all the time. Taking Prilosec in evenings before going to bed, taking at 8 or 9 after dinner. Occasional early satiety. Feels bloated. Rare NSAIDs.    A1c a year ago was 9.5. I don't have a recent one; she feels it may be the same or possibly better. Has had difficulty with blood sugar control in the past.   Grandmother has history of esophageal strictures.   Past Medical History:  Diagnosis Date  . Anxiety   .  Diabetes mellitus without complication ([place])   . Hyperlipemia   . Hypertension   . Pneumonia     Past Surgical History:  Procedure Laterality Date  . CHOLECYSTECTOMY    . COLONOSCOPY N/A 09/04/2017   Dr. Gala Romney: multiple tubular adenomas removed, diverticulosis. next TCS in 3 years.   . TONSILLECTOMY    . TYMPANOSTOMY TUBE PLACEMENT      Current Outpatient Medications  Medication Sig Dispense Refill  . acetaminophen (TYLENOL) 500 MG tablet Take 1,000 mg by mouth every 6 (six) hours as needed for moderate pain or headache.     Marland Kitchen atorvastatin (LIPITOR) 20 MG tablet Take 20 mg by mouth at bedtime.     . Chlorpheniramine Maleate (ALLERGY PO) Take by mouth daily.    Marland Kitchen FLUoxetine (PROZAC) 20 MG tablet Take 30 mg by mouth daily.    Marland Kitchen glimepiride (AMARYL) 4 MG tablet Take 4 mg by mouth daily with breakfast.    . ibuprofen (ADVIL,MOTRIN) 200 MG tablet Take 400 mg by mouth every 6 (six) hours as needed for headache or moderate pain.    Marland Kitchen lisinopril (PRINIVIL,ZESTRIL) 2.5 MG tablet Take 2.5 mg by mouth daily.    . naproxen sodium (ALEVE) 220 MG tablet Take 220 mg by mouth 2 (two) times daily as needed (for pain or headaches).    Marland Kitchen omeprazole (PRILOSEC OTC) 20 MG tablet Take 20 mg by mouth daily.    . sitaGLIPtin-metformin (JANUMET) 50-1000  MG tablet Take 1 tablet by mouth 2 (two) times daily with a meal.    . pantoprazole (PROTONIX) 40 MG tablet Take 1 tablet (40 mg total) by mouth daily. Take 30 minutes before breakfast daily 90 tablet 3   No current facility-administered medications for this visit.     Allergies as of 03/18/2018 - Review Complete 03/18/2018  Allergen Reaction Noted  . Betadine [povidone iodine] Swelling 10/01/2016  . Ciprofloxacin  11/27/2017    Family History  Problem Relation Age of Onset  . Diabetes Mother   . Peripheral vascular disease Mother   . Diabetes Father   . Colitis Daughter 52       lymphocytic colitis  . Colon cancer Neg Hx     Social History    Socioeconomic History  . Marital status: Divorced    Spouse name: Not on file  . Number of children: Not on file  . Years of education: Not on file  . Highest education level: Not on file  Occupational History  . Not on file  Social Needs  . Financial resource strain: Not on file  . Food insecurity:    Worry: Not on file    Inability: Not on file  . Transportation needs:    Medical: Not on file    Non-medical: Not on file  Tobacco Use  . Smoking status: Former Smoker    Last attempt to quit: 09/22/2016    Years since quitting: 1.4  . Smokeless tobacco: Never Used  Substance and Sexual Activity  . Alcohol use: Yes    Comment: very little  . Drug use: No  . Sexual activity: Not on file  Lifestyle  . Physical activity:    Days per week: Not on file    Minutes per session: Not on file  . Stress: Not on file  Relationships  . Social connections:    Talks on phone: Not on file    Gets together: Not on file    Attends religious service: Not on file    Active member of club or organization: Not on file    Attends meetings of clubs or organizations: Not on file    Relationship status: Not on file  Other Topics Concern  . Not on file  Social History Narrative  . Not on file    Review of Systems: Gen: Denies fever, chills, anorexia. Denies fatigue, weakness, weight loss.  CV: Denies chest pain, palpitations, syncope, peripheral edema, and claudication. Resp: Denies dyspnea at rest, cough, wheezing, coughing up blood, and pleurisy. GI: see HPI  Derm: Denies rash, itching, dry skin Psych: Denies depression, anxiety, memory loss, confusion. No homicidal or suicidal ideation.  Heme: Denies bruising, bleeding, and enlarged lymph nodes.  Physical Exam: BP 126/75   Pulse 71   Temp (!) 97.2 F (36.2 C) (Oral)   Ht 5\' 6"  (1.676 m)   Wt 262 lb (118.8 kg)   LMP 03/03/2012   BMI 42.29 kg/m  General:   Alert and oriented. No distress noted. Pleasant and cooperative.  Head:   Normocephalic and atraumatic. Eyes:  Conjuctiva clear without scleral icterus. Mouth:  Oral mucosa pink and moist.  Lungs: clear bilaterally Cardiac: possible soft/faint systolic murmur  Abdomen:  +BS, soft, non-tender and non-distended. No rebound or guarding. No HSM or masses noted. Msk:  Symmetrical without gross deformities. Normal posture. Extremities:  Without edema. Neurologic:  Alert and  oriented x4 Psych:  Alert and cooperative. Normal mood and affect.  Lab Results  Component Value Date   WBC 10.0 03/10/2018   HGB 11.1 (L) 03/10/2018   HCT 35.4 (L) 03/10/2018   MCV 87.8 03/10/2018   PLT 295 03/10/2018

## 2018-03-18 NOTE — Assessment & Plan Note (Signed)
57 year old female with intermittent episodes of nausea and vomiting, belching, early satiety. Occasional typical GERD symptoms but rare. Although she has started taking Prilosec OTC, this has not been taken at the most efficacious time. She is also diabetic, so I query delayed gastric emptying. Interestingly, A1c was in the mid 9 range a year ago, but we do not have an updated one. Slow downward trend in Hgb noted without overt GI bleeding. I suspect she has atypical GERD, ?gastritis, doubt PUD, highly likely delayed gastric emptying playing a role as well but need endoscopy first.   Proceed with upper endoscopy in the near future with Dr. Gala Romney. The risks, benefits, and alternatives have been discussed in detail with patient. They have stated understanding and desire to proceed.  Stop Prilosec. Start Protonix once each day: sent to pharmacy Return in 4 months May need GES

## 2018-03-18 NOTE — Progress Notes (Signed)
cc'd to pcp 

## 2018-03-18 NOTE — Patient Instructions (Signed)
Called [facility], no PA needed for EGD. Ref# [id number].

## 2018-03-18 NOTE — Assessment & Plan Note (Signed)
Most recent Hgb 11.1. Check iron studies. At her request, I am also checking an A1c and lipid panel, as she will be seeing her PCP shortly. Will forward this to them.

## 2018-03-19 ENCOUNTER — Ambulatory Visit: Payer: PRIVATE HEALTH INSURANCE | Admitting: Gastroenterology

## 2018-03-24 NOTE — Progress Notes (Signed)
A1c is 8.9. This is elevated but better than a year ago. Lipid panel for PCP. Iron and ferritin both normal but on "low end" of normal. Would monitor for now. Please send labs to PCP. I have sent note in [place].

## 2018-04-06 ENCOUNTER — Encounter: Payer: Self-pay | Admitting: Gastroenterology

## 2018-04-07 ENCOUNTER — Telehealth: Payer: Self-pay | Admitting: *Deleted

## 2018-04-07 NOTE — Telephone Encounter (Signed)
Spoke with patient and her new procedure time for EGD on 04/09/18 is at 9:30am. Arrival time 8:30am. Patient aware her instructions will change. She no longer can have clear liquids morning of procedure. She will stop all clear liquids after midnight on 04/08/18. She voiced understanding. Called Endo and LMOVM making aware of the change

## 2018-04-09 ENCOUNTER — Other Ambulatory Visit: Payer: Self-pay

## 2018-04-09 ENCOUNTER — Encounter (HOSPITAL_COMMUNITY)
Admission: RE | Disposition: A | Discharge status: Home Health | Payer: Self-pay | Source: Ambulatory Visit | Attending: Internal Medicine

## 2018-04-09 ENCOUNTER — Encounter (HOSPITAL_COMMUNITY): Payer: Self-pay

## 2018-04-09 ENCOUNTER — Ambulatory Visit (HOSPITAL_COMMUNITY)
Admission: RE | Admit: 2018-04-09 | Discharge: 2018-04-09 | Disposition: A | Discharge status: Home Health | Payer: PRIVATE HEALTH INSURANCE | Source: Ambulatory Visit | Attending: Internal Medicine | Admitting: Internal Medicine

## 2018-04-09 DIAGNOSIS — Z79899 Other long term (current) drug therapy: Secondary | ICD-10-CM | POA: Insufficient documentation

## 2018-04-09 DIAGNOSIS — Z87891 Personal history of nicotine dependence: Secondary | ICD-10-CM | POA: Insufficient documentation

## 2018-04-09 DIAGNOSIS — E119 Type 2 diabetes mellitus without complications: Secondary | ICD-10-CM | POA: Insufficient documentation

## 2018-04-09 DIAGNOSIS — K219 Gastro-esophageal reflux disease without esophagitis: Secondary | ICD-10-CM | POA: Insufficient documentation

## 2018-04-09 DIAGNOSIS — K228 Other specified diseases of esophagus: Secondary | ICD-10-CM

## 2018-04-09 DIAGNOSIS — Z881 Allergy status to other antibiotic agents status: Secondary | ICD-10-CM | POA: Insufficient documentation

## 2018-04-09 DIAGNOSIS — I10 Essential (primary) hypertension: Secondary | ICD-10-CM | POA: Insufficient documentation

## 2018-04-09 DIAGNOSIS — K760 Fatty (change of) liver, not elsewhere classified: Secondary | ICD-10-CM | POA: Diagnosis not present

## 2018-04-09 DIAGNOSIS — R112 Nausea with vomiting, unspecified: Secondary | ICD-10-CM | POA: Diagnosis present

## 2018-04-09 DIAGNOSIS — Z888 Allergy status to other drugs, medicaments and biological substances status: Secondary | ICD-10-CM | POA: Diagnosis not present

## 2018-04-09 DIAGNOSIS — R110 Nausea: Secondary | ICD-10-CM

## 2018-04-09 DIAGNOSIS — F419 Anxiety disorder, unspecified: Secondary | ICD-10-CM | POA: Insufficient documentation

## 2018-04-09 DIAGNOSIS — Z7984 Long term (current) use of oral hypoglycemic drugs: Secondary | ICD-10-CM | POA: Diagnosis not present

## 2018-04-09 DIAGNOSIS — R160 Hepatomegaly, not elsewhere classified: Secondary | ICD-10-CM | POA: Diagnosis not present

## 2018-04-09 DIAGNOSIS — D649 Anemia, unspecified: Secondary | ICD-10-CM

## 2018-04-09 DIAGNOSIS — E785 Hyperlipidemia, unspecified: Secondary | ICD-10-CM | POA: Insufficient documentation

## 2018-04-09 HISTORY — DX: Depression, unspecified: F32.A

## 2018-04-09 HISTORY — PX: ESOPHAGOGASTRODUODENOSCOPY: SHX5428

## 2018-04-09 HISTORY — DX: Major depressive disorder, single episode, unspecified: F32.9

## 2018-04-09 HISTORY — DX: Chronic kidney disease, unspecified: N18.9

## 2018-04-09 LAB — GLUCOSE, CAPILLARY: Glucose-Capillary: 176 mg/dL — ABNORMAL HIGH (ref 70–99)

## 2018-04-09 SURGERY — EGD (ESOPHAGOGASTRODUODENOSCOPY)
Anesthesia: Moderate Sedation

## 2018-04-09 MED ORDER — ONDANSETRON HCL 4 MG/2ML IJ SOLN
INTRAMUSCULAR | Status: DC | PRN
  Administered 2018-04-09: 4 mg via INTRAVENOUS

## 2018-04-09 MED ORDER — MIDAZOLAM HCL 5 MG/5ML IJ SOLN
INTRAMUSCULAR | Status: AC
  Filled 2018-04-09: qty 10

## 2018-04-09 MED ORDER — MEPERIDINE HCL 100 MG/ML IJ SOLN
INTRAMUSCULAR | Status: DC | PRN
  Administered 2018-04-09 (×2): 25 mg via INTRAVENOUS

## 2018-04-09 MED ORDER — LIDOCAINE VISCOUS HCL 2 % MT SOLN
OROMUCOSAL | Status: DC | PRN
  Administered 2018-04-09: 5 mL via OROMUCOSAL

## 2018-04-09 MED ORDER — STERILE WATER FOR IRRIGATION IR SOLN
Status: DC | PRN
  Administered 2018-04-09: 2.5 mL

## 2018-04-09 MED ORDER — ONDANSETRON HCL 4 MG/2ML IJ SOLN
INTRAMUSCULAR | Status: AC
  Filled 2018-04-09: qty 2

## 2018-04-09 MED ORDER — MEPERIDINE HCL 100 MG/ML IJ SOLN
INTRAMUSCULAR | Status: AC
  Filled 2018-04-09: qty 2

## 2018-04-09 MED ORDER — SODIUM CHLORIDE 0.9 % IV SOLN
INTRAVENOUS | Status: DC
  Administered 2018-04-09: 08:00:00 via INTRAVENOUS

## 2018-04-09 MED ORDER — LIDOCAINE VISCOUS HCL 2 % MT SOLN
OROMUCOSAL | Status: AC
  Filled 2018-04-09: qty 15

## 2018-04-09 MED ORDER — MIDAZOLAM HCL 5 MG/5ML IJ SOLN
INTRAMUSCULAR | Status: DC | PRN
  Administered 2018-04-09 (×2): 2 mg via INTRAVENOUS

## 2018-04-09 NOTE — Op Note (Signed)
[facility] Patient Name: Jeanette Aguirre Procedure Date: 04/09/2018 9:04 AM MRN: [id number] Date of Birth: 09/02/1961 Attending MD: Norvel Richards , MD CSN: [id number] Age: 57 Admit Type: Outpatient Procedure:                Upper GI endoscopy Indications:              Nausea Providers:                Norvel Richards, MD, Gwynneth Albright RN,                            RN, Nelma Rothman, Technician Referring MD:              Medicines:                Midazolam 4 mg IV, Meperidine 50 mg IV Complications:            No immediate complications. Estimated Blood Loss:     Estimated blood loss was minimal. Procedure:                Pre-Anesthesia Assessment:                           - Prior to the procedure, a History and Physical                            was performed, and patient medications and                            allergies were reviewed. The patient's tolerance of                            previous anesthesia was also reviewed. The risks                            and benefits of the procedure and the sedation                            options and risks were discussed with the patient.                            All questions were answered, and informed consent                            was obtained. Prior Anticoagulants: The patient has                            taken no previous anticoagulant or antiplatelet                            agents. ASA Grade Assessment: II - A patient with                            mild systemic disease. After reviewing the risks  and benefits, the patient was deemed in                            satisfactory condition to undergo the procedure.                           After obtaining informed consent, the endoscope was                            passed under direct vision. Throughout the                            procedure, the patient's blood pressure, pulse, and                            oxygen  saturations were monitored continuously. The                            [id number]-H190 ([id number]) scope was introduced through the                            mouth, and advanced to the second part of duodenum.                            The upper GI endoscopy was accomplished without                            difficulty. The patient tolerated the procedure                            well. Scope In: 9:23:25 AM Scope Out: 9:29:10 AM Total Procedure Duration: 0 hours 5 minutes 45 seconds  Findings:      Salmon-colored mucosa was present. 3 "tongues" coming up 2 cm above the       GE junction. No esophagitis.      The entire examined stomach was normal. Stomach empty.      The duodenal bulb and second portion of the duodenum were normal.       Abnormal appearing distal esophagus was biopsied with a cold forceps for       histology. Estimated blood loss was minimal. Impression:               - Salmon-colored mucosa. Biopsied.                           - Normal stomach.                           - Normal duodenal bulb and second portion of the                            duodenum. Diabetes poorly controlled. May have an                            element of underlying gastroparesis. Moderate Sedation:      Moderate (conscious)  sedation was administered by the endoscopy nurse       and supervised by the endoscopist. The following parameters were       monitored: oxygen saturation, heart rate, blood pressure, respiratory       rate, EKG, adequacy of pulmonary ventilation, and response to care.       Total physician intraservice time was 13 minutes. Recommendation:           - Patient has a contact number available for                            emergencies. The signs and symptoms of potential                            delayed complications were discussed with the                            patient. Return to normal activities tomorrow.                            Written discharge instructions were  provided to the                            patient.                           - Resume previous diet.                           - Continue present medications. Trial of                            gastroparesis diet. Continue Protonix 40 mg daily.                            Follow up on pathology.                           - No repeat upper endoscopy.                           - Return to GI office in 2 months. Procedure Code(s):        --- Professional ---                           [phone], Esophagogastroduodenoscopy, flexible,                            transoral; with biopsy, single or multiple                           G0500, Moderate sedation services provided by the                            same physician or other qualified health care  professional performing a gastrointestinal                            endoscopic service that sedation supports,                            requiring the presence of an independent trained                            observer to assist in the monitoring of the                            patient's level of consciousness and physiological                            status; initial 15 minutes of intra-service time;                            patient age 50 years or older (additional time may                            be reported with [phone], as appropriate) Diagnosis Code(s):        --- Professional ---                           K22.8, Other specified diseases of esophagus                           R11.0, Nausea CPT copyright 2017 American Medical Association. All rights reserved. The codes documented in this report are preliminary and upon coder review may  be revised to meet current compliance requirements. Cristopher Estimable. Ch[place]uebuka Churchill, MD Norvel Richards, MD 04/09/2018 9:41:17 AM This report has been signed electronically. Number of Addenda: 0

## 2018-04-09 NOTE — Discharge Instructions (Signed)
EGD Discharge instructions Please read the instructions outlined below and refer to this sheet in the next few weeks. These discharge instructions provide you with general information on caring for yourself after you leave the hospital. Your doctor may also give you specific instructions. While your treatment has been planned according to the most current medical practices available, unavoidable complications occasionally occur. If you have any problems or questions after discharge, please call your doctor. ACTIVITY  You may resume your regular activity but move at a slower pace for the next 24 hours.   Take frequent rest periods for the next 24 hours.   Walking will help expel (get rid of) the air and reduce the bloated feeling in your abdomen.   No driving for 24 hours (because of the anesthesia (medicine) used during the test).   You may shower.   Do not sign any important legal documents or operate any machinery for 24 hours (because of the anesthesia used during the test).  NUTRITION  Drink plenty of fluids.   You may resume your normal diet.   Begin with a light meal and progress to your normal diet.   Avoid alcoholic beverages for 24 hours or as instructed by your caregiver.  MEDICATIONS  You may resume your normal medications unless your caregiver tells you otherwise.  WHAT YOU CAN EXPECT TODAY  You may experience abdominal discomfort such as a feeling of fullness or gas pains.  FOLLOW-UP  Your doctor will discuss the results of your test with you.  SEEK IMMEDIATE MEDICAL ATTENTION IF ANY OF THE FOLLOWING OCCUR:  Excessive nausea (feeling sick to your stomach) and/or vomiting.   Severe abdominal pain and distention (swelling).   Trouble swallowing.   Temperature over 101 F (37.8 C).   Rectal bleeding or vomiting of blood.    It's very important to get your diabetes under better control  Continue Protonix 40 mg daily  Information on a gastroparesis diet  provided  Further recommendations to follow pending review of pathology report  Office visit with [place] in 8 weeks.  July 14, 2018 at 8:30AM

## 2018-04-09 NOTE — Interval H&P Note (Signed)
History and Physical Interval Note:  04/09/2018 9:13 AM  Jeanette Aguirre  has presented today for surgery, with the diagnosis of nausea/vomiting, anemia  The various methods of treatment have been discussed with the patient and family. After consideration of risks, benefits and other options for treatment, the patient has consented to  Procedure(s) with comments: ESOPHAGOGASTRODUODENOSCOPY (EGD) (N/A) - 2:30pm as a surgical intervention .  The patient's history has been reviewed, patient examined, no change in status, stable for surgery.  I have reviewed the patient's chart and labs.  Questions were answered to the patient's satisfaction.     Jeanette Aguirre  No dysphagia. One episode of nausea since being started on Protonix. No abdominal pain. Diagnostic EGD per plan.  The risks, benefits, limitations, alternatives and imponderables have been reviewed with the patient. Potential for esophageal dilation, biopsy, etc. have also been reviewed.  Questions have been answered. All parties agreeable.

## 2018-04-13 ENCOUNTER — Other Ambulatory Visit (HOSPITAL_COMMUNITY): Payer: Self-pay | Admitting: Family Medicine

## 2018-04-13 ENCOUNTER — Ambulatory Visit (HOSPITAL_COMMUNITY)
Admission: RE | Admit: 2018-04-13 | Discharge: 2018-04-13 | Disposition: A | Discharge status: Home / Self Care | Payer: PRIVATE HEALTH INSURANCE | Source: Ambulatory Visit | Attending: Family Medicine | Admitting: Family Medicine

## 2018-04-13 DIAGNOSIS — S92252A Displaced fracture of navicular [scaphoid] of left foot, initial encounter for closed fracture: Secondary | ICD-10-CM | POA: Diagnosis not present

## 2018-04-13 DIAGNOSIS — M25572 Pain in left ankle and joints of left foot: Secondary | ICD-10-CM

## 2018-04-13 DIAGNOSIS — X58XXXA Exposure to other specified factors, initial encounter: Secondary | ICD-10-CM | POA: Insufficient documentation

## 2018-04-13 DIAGNOSIS — S92152A Displaced avulsion fracture (chip fracture) of left talus, initial encounter for closed fracture: Secondary | ICD-10-CM | POA: Diagnosis not present

## 2018-04-14 ENCOUNTER — Encounter (HOSPITAL_COMMUNITY): Payer: Self-pay | Admitting: Internal Medicine

## 2018-04-15 ENCOUNTER — Ambulatory Visit (INDEPENDENT_AMBULATORY_CARE_PROVIDER_SITE_OTHER): Payer: PRIVATE HEALTH INSURANCE | Admitting: Orthopaedic Surgery

## 2018-04-15 ENCOUNTER — Encounter: Payer: Self-pay | Admitting: Orthopaedic Surgery

## 2018-04-15 VITALS — BP 114/80 | HR 84 | Ht 66.0 in | Wt 258.0 lb

## 2018-04-15 DIAGNOSIS — S92252A Displaced fracture of navicular [scaphoid] of left foot, initial encounter for closed fracture: Secondary | ICD-10-CM | POA: Diagnosis not present

## 2018-04-15 DIAGNOSIS — S92152A Displaced avulsion fracture (chip fracture) of left talus, initial encounter for closed fracture: Secondary | ICD-10-CM

## 2018-04-15 DIAGNOSIS — Z6841 Body Mass Index (BMI) 40.0 and over, adult: Secondary | ICD-10-CM

## 2018-04-15 NOTE — Progress Notes (Signed)
Subjective:    Patient ID: Jeanette Aguirre, female    DOB: March 30, 1961, 57 y.o.   MRN: [id number]  HPI She hurt her left ankle in a twisting activity on 04-13-18.  She had X-rays done which showed: IMPRESSION: There are tiny avulsion fractures from the talus and navicular bone associated with soft tissue swelling. Correlate clinically.  She was told of the findings and borrowed a friend's CAM walker which she is using now.  She has no other injury.  She has more pain of the lateral ankle with swelling of the ankle diffusely.  She has no redness, no numbness.    I have reviewed her x-rays and notes.    Review of Systems  Constitutional: Positive for activity change.  Musculoskeletal: Positive for arthralgias, gait problem and joint swelling.  Psychiatric/Behavioral: Positive for self-injury.  All other systems reviewed and are negative.  For Review of Systems, all other systems reviewed and are negative.  Past Medical History:  Diagnosis Date  . Anxiety   . Chronic kidney disease   . Depression   . Diabetes mellitus without complication ([place])   . GERD (gastroesophageal reflux disease)   . Hyperlipemia   . Pneumonia     Past Surgical History:  Procedure Laterality Date  . CHOLECYSTECTOMY    . COLONOSCOPY N/A 09/04/2017   Dr. Gala Romney: multiple tubular adenomas removed, diverticulosis. next TCS in 3 years.   . ESOPHAGOGASTRODUODENOSCOPY N/A 04/09/2018   Procedure: ESOPHAGOGASTRODUODENOSCOPY (EGD);  Surgeon: Daneil Dolin, MD;  Location: [facility];  Service: Endoscopy;  Laterality: N/A;  2:30pm  . TONSILLECTOMY    . TUBAL LIGATION    . TYMPANOSTOMY TUBE PLACEMENT      Current Outpatient Medications on File Prior to Visit  Medication Sig Dispense Refill  . acetaminophen (TYLENOL) 500 MG tablet Take 1,000 mg by mouth every 6 (six) hours as needed for moderate pain or headache.     Marland Kitchen atorvastatin (LIPITOR) 20 MG tablet Take 20 mg by mouth at bedtime.     .  Chlorpheniramine Maleate (ALLERGY PO) Take 1 tablet by mouth daily.     Marland Kitchen FLUoxetine (PROZAC) 20 MG tablet Take 30 mg by mouth daily.    Marland Kitchen glimepiride (AMARYL) 4 MG tablet Take 4 mg by mouth daily with breakfast.    . ibuprofen (ADVIL,MOTRIN) 200 MG tablet Take 400 mg by mouth every 6 (six) hours as needed for headache or moderate pain.    Marland Kitchen lisinopril (PRINIVIL,ZESTRIL) 2.5 MG tablet Take 2.5 mg by mouth daily.    . naproxen sodium (ALEVE) 220 MG tablet Take 220 mg by mouth 2 (two) times daily as needed (for pain or headaches).    . pantoprazole (PROTONIX) 40 MG tablet Take 1 tablet (40 mg total) by mouth daily. Take 30 minutes before breakfast daily 90 tablet 3  . sitaGLIPtin-metformin (JANUMET) 50-1000 MG tablet Take 1 tablet by mouth 2 (two) times daily with a meal.     No current facility-administered medications on file prior to visit.     Social History   Socioeconomic History  . Marital status: Divorced    Spouse name: Not on file  . Number of children: Not on file  . Years of education: Not on file  . Highest education level: Not on file  Occupational History  . Not on file  Social Needs  . Financial resource strain: Not on file  . Food insecurity:    Worry: Not on file    Inability:  Not on file  . Transportation needs:    Medical: Not on file    Non-medical: Not on file  Tobacco Use  . Smoking status: Former Smoker    Last attempt to quit: 09/22/2016    Years since quitting: 1.5  . Smokeless tobacco: Never Used  Substance and Sexual Activity  . Alcohol use: Yes    Comment: very little  . Drug use: No  . Sexual activity: Not on file  Lifestyle  . Physical activity:    Days per week: Not on file    Minutes per session: Not on file  . Stress: Not on file  Relationships  . Social connections:    Talks on phone: Not on file    Gets together: Not on file    Attends religious service: Not on file    Active member of club or organization: Not on file    Attends  meetings of clubs or organizations: Not on file    Relationship status: Not on file  . Intimate partner violence:    Fear of current or ex partner: Not on file    Emotionally abused: Not on file    Physically abused: Not on file    Forced sexual activity: Not on file  Other Topics Concern  . Not on file  Social History Narrative  . Not on file    Family History  Problem Relation Age of Onset  . Diabetes Mother   . Peripheral vascular disease Mother   . Diabetes Father   . Colitis Daughter 3       lymphocytic colitis  . Colon cancer Neg Hx     BP 114/80   Pulse 84   Ht 5\' 6"  (1.676 m)   Wt 258 lb (117 kg)   LMP 03/03/2012   BMI 41.64 kg/m   Body mass index is 41.64 kg/m.    The patient meets the AMA guidelines for Morbid (severe) obesity with a BMI > 40.0 and I have recommended weight loss. Objective:   Physical Exam  Constitutional: She is oriented to person, place, and time. She appears well-developed and well-nourished.  HENT:  Head: Normocephalic and atraumatic.  Eyes: Pupils are equal, round, and reactive to light. Conjunctivae and EOM are normal.  Neck: Normal range of motion. Neck supple.  Cardiovascular: Normal rate, regular rhythm and intact distal pulses.  Pulmonary/Chest: Effort normal.  Abdominal: Soft.  Musculoskeletal:       Left ankle: She exhibits decreased range of motion and swelling. Tenderness. Lateral malleolus tenderness found.       Feet:  Neurological: She is alert and oriented to person, place, and time. She has normal reflexes. She displays normal reflexes. No cranial nerve deficit. She exhibits normal muscle tone. Coordination normal.  Skin: Skin is warm and dry.  Psychiatric: She has a normal mood and affect. Her behavior is normal. Judgment and thought content normal.          Assessment & Plan:   Encounter Diagnoses  Name Primary?  . Closed displaced avulsion fracture of left talus, initial encounter Yes  . Closed displaced  fracture of navicular bone of left foot, initial encounter   . Body mass index 40.0-44.9, adult ([place])   . Morbid obesity ([place])    I have told her about using the CAM walker.  I have given instructions for Contrast Baths.  Return in two weeks.  X-rays on return.  Continue her ibuprofen.  Call if any problem.  Precautions discussed.  Electronically Signed Sanjuana Kava, MD 7/25/201910:14 AM

## 2018-04-22 ENCOUNTER — Encounter: Payer: Self-pay | Admitting: Internal Medicine

## 2018-04-29 ENCOUNTER — Ambulatory Visit (INDEPENDENT_AMBULATORY_CARE_PROVIDER_SITE_OTHER): Payer: PRIVATE HEALTH INSURANCE

## 2018-04-29 ENCOUNTER — Ambulatory Visit (INDEPENDENT_AMBULATORY_CARE_PROVIDER_SITE_OTHER): Payer: PRIVATE HEALTH INSURANCE | Admitting: Orthopaedic Surgery

## 2018-04-29 ENCOUNTER — Encounter: Payer: Self-pay | Admitting: Orthopaedic Surgery

## 2018-04-29 DIAGNOSIS — S92152D Displaced avulsion fracture (chip fracture) of left talus, subsequent encounter for fracture with routine healing: Secondary | ICD-10-CM

## 2018-04-29 NOTE — Progress Notes (Signed)
CC:  My foot is better  She is wearing the CAM walker and has had no problem.   NV intact.  She has no swelling of the foot on the left. She is a little more tender on the left lateral side.  ROM of the ankle is full.  X-rays were done and reported separately.  Encounter Diagnosis  Name Primary?  . Closed displaced avulsion fracture of left talus with routine healing, subsequent encounter Yes   She can wean her self out of the CAM walker.  Return in two weeks.  No X-rays on return.  Call if any problem.  Precautions discussed.   Electronically Signed Sanjuana Kava, MD 8/8/20199:02 AM

## 2018-05-13 ENCOUNTER — Ambulatory Visit (INDEPENDENT_AMBULATORY_CARE_PROVIDER_SITE_OTHER): Payer: PRIVATE HEALTH INSURANCE | Admitting: Orthopaedic Surgery

## 2018-05-13 ENCOUNTER — Encounter: Payer: Self-pay | Admitting: Orthopaedic Surgery

## 2018-05-13 VITALS — BP 105/65 | HR 79 | Ht 66.0 in | Wt 258.0 lb

## 2018-05-13 DIAGNOSIS — Z6841 Body Mass Index (BMI) 40.0 and over, adult: Secondary | ICD-10-CM

## 2018-05-13 DIAGNOSIS — S92152D Displaced avulsion fracture (chip fracture) of left talus, subsequent encounter for fracture with routine healing: Secondary | ICD-10-CM

## 2018-05-13 NOTE — Progress Notes (Signed)
CC:  I do not hurt  She is doing well with the left ankle.  She has no pain.  NV intact.  Gait is normal.  The patient meets the AMA guidelines for Morbid (severe) obesity with a BMI > 40.0 and I have recommended weight loss.  Encounter Diagnoses  Name Primary?  . Closed displaced avulsion fracture of left talus with routine healing, subsequent encounter Yes  . Body mass index 40.0-44.9, adult ([place])   . Morbid obesity ([place])    I will see her as needed.  Call if any problem.  Precautions discussed.   Electronically Signed Sanjuana Kava, MD 8/22/20198:18 AM

## 2018-07-14 ENCOUNTER — Ambulatory Visit: Payer: PRIVATE HEALTH INSURANCE | Admitting: Gastroenterology

## 2018-07-21 ENCOUNTER — Ambulatory Visit: Payer: PRIVATE HEALTH INSURANCE | Admitting: Gastroenterology

## 2018-07-29 ENCOUNTER — Other Ambulatory Visit (HOSPITAL_COMMUNITY)
Admission: RE | Admit: 2018-07-29 | Discharge: 2018-07-29 | Disposition: A | Discharge status: Home / Self Care | Payer: PRIVATE HEALTH INSURANCE | Source: Ambulatory Visit | Attending: Internal Medicine | Admitting: Internal Medicine

## 2018-07-29 DIAGNOSIS — Z6841 Body Mass Index (BMI) 40.0 and over, adult: Secondary | ICD-10-CM | POA: Insufficient documentation

## 2018-07-29 DIAGNOSIS — Z1389 Encounter for screening for other disorder: Secondary | ICD-10-CM | POA: Insufficient documentation

## 2018-07-29 LAB — COMPREHENSIVE METABOLIC PANEL
ALBUMIN: 3.9 g/dL (ref 3.5–5.0)
ALT: 25 U/L (ref 0–44)
AST: 22 U/L (ref 15–41)
Alkaline Phosphatase: 107 U/L (ref 38–126)
Anion gap: 10 (ref 5–15)
BUN: 12 mg/dL (ref 6–20)
CO2: 23 mmol/L (ref 22–32)
CREATININE: 0.62 mg/dL (ref 0.44–1.00)
Calcium: 9.1 mg/dL (ref 8.9–10.3)
Chloride: 106 mmol/L (ref 98–111)
GFR calc non Af Amer: >60 mL/min (ref >60)
Glucose, Bld: 156 mg/dL — ABNORMAL HIGH (ref 70–99)
POTASSIUM: 4 mmol/L (ref 3.5–5.1)
Sodium: 139 mmol/L (ref 135–145)
Total Bilirubin: 0.4 mg/dL (ref 0.3–1.2)
Total Protein: 7.4 g/dL (ref 6.5–8.1)

## 2018-07-29 LAB — CBC WITH DIFFERENTIAL/PLATELET
Abs Immature Granulocytes: 0.02 10*3/uL (ref 0.00–0.07)
BASOS ABS: 0 10*3/uL (ref 0.0–0.1)
Basophils Relative: 0 %
EOS PCT: 2 %
Eosinophils Absolute: 0.2 10*3/uL (ref 0.0–0.5)
HEMATOCRIT: 35.4 % — AB (ref 36.0–46.0)
HEMOGLOBIN: 11.2 g/dL — AB (ref 12.0–15.0)
Immature Granulocytes: 0 %
LYMPHS ABS: 2.7 10*3/uL (ref 0.7–4.0)
LYMPHS PCT: 30 %
MCH: 27.2 pg (ref 26.0–34.0)
MCHC: 31.6 g/dL (ref 30.0–36.0)
MCV: 85.9 fL (ref 80.0–100.0)
MONO ABS: 0.3 10*3/uL (ref 0.1–1.0)
Monocytes Relative: 4 %
Neutro Abs: 5.8 10*3/uL (ref 1.7–7.7)
Neutrophils Relative %: 64 %
Platelets: 321 10*3/uL (ref 150–400)
RBC: 4.12 MIL/uL (ref 3.87–5.11)
RDW: 13.2 % (ref 11.5–15.5)
WBC: 9.1 10*3/uL (ref 4.0–10.5)
nRBC: 0 % (ref 0.0–0.2)

## 2018-07-29 LAB — LIPID PANEL
CHOLESTEROL: 164 mg/dL (ref 0–200)
HDL: 52 mg/dL (ref >40)
LDL Cholesterol: 65 mg/dL (ref 0–99)
TRIGLYCERIDES: 234 mg/dL — AB (ref <150)
Total CHOL/HDL Ratio: 3.2 RATIO
VLDL: 47 mg/dL — ABNORMAL HIGH (ref 0–40)

## 2018-07-29 LAB — TSH: TSH: 1.811 u[IU]/mL (ref 0.350–4.500)

## 2018-08-23 ENCOUNTER — Encounter: Payer: Self-pay | Admitting: Gastroenterology

## 2018-08-23 ENCOUNTER — Ambulatory Visit (INDEPENDENT_AMBULATORY_CARE_PROVIDER_SITE_OTHER): Payer: PRIVATE HEALTH INSURANCE | Admitting: Gastroenterology

## 2018-08-23 VITALS — BP 122/75 | HR 67 | Temp 96.8°F | Ht 66.0 in | Wt 255.2 lb

## 2018-08-23 DIAGNOSIS — D649 Anemia, unspecified: Secondary | ICD-10-CM | POA: Diagnosis not present

## 2018-08-23 DIAGNOSIS — R160 Hepatomegaly, not elsewhere classified: Secondary | ICD-10-CM

## 2018-08-23 DIAGNOSIS — K2100 Gastro-esophageal reflux disease with esophagitis, without bleeding: Secondary | ICD-10-CM

## 2018-08-23 DIAGNOSIS — K219 Gastro-esophageal reflux disease without esophagitis: Secondary | ICD-10-CM | POA: Insufficient documentation

## 2018-08-23 DIAGNOSIS — R140 Abdominal distension (gaseous): Secondary | ICD-10-CM

## 2018-08-23 NOTE — Progress Notes (Signed)
Primary Care Physician: Redmond School, MD  Primary Gastroenterologist:  Garfield Cornea, MD   Chief Complaint  Patient presents with  . Bloated    HPI: Jeanette Aguirre is a 57 y.o. female here for follow-up.  She was seen back in June for nausea/vomiting, bloating, diarrhea.  She also has a history of hepatomegaly in the setting of fatty liver, no splenomegaly.  Chronic diarrhea since cholecystectomy in the remote past.  Also felt to have GERD symptoms, we previously recommended PPI but initially she declined pending EGD.  She had used some Prilosec OTC intermittently.  Mild anemia with hemoglobin 11.1 range.  Last office visit she was started on pantoprazole 40 mg daily.  EGD in 03/2018 showed some inflammation in the esophagus consistent with acid reflux.  Was advised to stay on pantoprazole daily.  She had labs on November 7 with stable anemia, hemoglobin 11.2, hematocrit 35.4, MCV normal at 85.9, LFTs normal, TSH normal.  Hemoglobin A1c of 8.9 back in June.  Iron and ferritin normal back in June as well.  Celiac serologies and H. pylori antibody negative in March 2019.  Colonoscopy up-to-date, multiple tubular adenomas removed back in December 2018, next colonoscopy planned for December 2021.  Recently had lip swelling/redness started one week ago and by Wednesday saw PCP. Concerned regarding angioedema related to Lisinopril so it was stopped. She was treated with doxycycline and steroids as well. Improving.   Since on pantoprazole her GI symptoms are better. No heartburn. Her N/V resolved. Occasionally has some bloating if overeats. For the most part much improved and satisfied with results.    Current Outpatient Medications  Medication Sig Dispense Refill  . acetaminophen (TYLENOL) 500 MG tablet Take 1,000 mg by mouth every 6 (six) hours as needed for moderate pain or headache.     Marland Kitchen atorvastatin (LIPITOR) 20 MG tablet Take 20 mg by mouth at bedtime.     . Chlorpheniramine  Maleate (ALLERGY PO) Take 1 tablet by mouth as needed.     Marland Kitchen FLUoxetine (PROZAC) 20 MG tablet Take 30 mg by mouth daily.    Marland Kitchen glimepiride (AMARYL) 4 MG tablet Take 4 mg by mouth 2 (two) times daily.     Marland Kitchen ibuprofen (ADVIL,MOTRIN) 200 MG tablet Take 400 mg by mouth every 6 (six) hours as needed for headache or moderate pain.    . naproxen sodium (ALEVE) 220 MG tablet Take 220 mg by mouth 2 (two) times daily as needed (for pain or headaches).    . pantoprazole (PROTONIX) 40 MG tablet Take 1 tablet (40 mg total) by mouth daily. Take 30 minutes before breakfast daily 90 tablet 3  . sitaGLIPtin-metformin (JANUMET) 50-1000 MG tablet Take 1 tablet by mouth 2 (two) times daily with a meal.     No current facility-administered medications for this visit.     Allergies as of 08/23/2018 - Review Complete 08/23/2018  Allergen Reaction Noted  . Betadine [povidone iodine] Swelling 10/01/2016  . Ciprofloxacin Nausea Only 11/27/2017  . Lisinopril Swelling 08/23/2018    ROS:  General: Negative for anorexia, weight loss, fever, chills, fatigue, weakness. ENT: Negative for hoarseness, difficulty swallowing , nasal congestion. CV: Negative for chest pain, angina, palpitations, dyspnea on exertion, peripheral edema.  Respiratory: Negative for dyspnea at rest, dyspnea on exertion, cough, sputum, wheezing.  GI: See history of present illness. GU:  Negative for dysuria, hematuria, urinary incontinence, urinary frequency, nocturnal urination.  Endo: Negative for unusual weight change.  Physical Examination:   BP 122/75   Pulse 67   Temp (!) 96.8 F (36 C) (Oral)   Ht 5\' 6"  (1.676 m)   Wt 255 lb 3.2 oz (115.8 kg)   LMP 03/03/2012   BMI 41.19 kg/m   General: Well-nourished, well-developed in no acute distress.  Eyes: No icterus. Mouth: Oropharyngeal mucosa moist and pink , no lesions erythema or exudate. Lungs: Clear to auscultation bilaterally.  Heart: Regular rate and rhythm, no murmurs rubs or  gallops.  Abdomen: Bowel sounds are normal, nontender, nondistended, no hepatosplenomegaly or masses, no abdominal bruits or hernia , no rebound or guarding.   Extremities: No lower extremity edema. No clubbing or deformities. Neuro: Alert and oriented x 4   Skin: Warm and dry, no jaundice.   Psych: Alert and cooperative, normal mood and affect.  Labs:  Lab Results  Component Value Date   CREATININE 0.62 07/29/2018   BUN 12 07/29/2018   NA 139 07/29/2018   K 4.0 07/29/2018   CL 106 07/29/2018   CO2 23 07/29/2018   Lab Results  Component Value Date   ALT 25 07/29/2018   AST 22 07/29/2018   ALKPHOS 107 07/29/2018   BILITOT 0.4 07/29/2018   Lab Results  Component Value Date   WBC 9.1 07/29/2018   HGB 11.2 (L) 07/29/2018   HCT 35.4 (L) 07/29/2018   MCV 85.9 07/29/2018   PLT 321 07/29/2018   Lab Results  Component Value Date   IRON 41 03/18/2018   TIBC 368 03/18/2018   FERRITIN 31 03/18/2018    Imaging Studies: No results found.

## 2018-08-23 NOTE — Patient Instructions (Signed)
1. Continue pantoprazole 40 mg daily before breakfast. 2. We will recheck your labs in 4 months regarding anemia. 3. Continue to follow-up with your family doctor, requesting liver function test (labs) at least yearly due to enlarged fatty liver. 4. Return to the office in 1 year or sooner if needed. 5. If you decide you would like gastric emptying test performed, send me a message.  Would be important to get a baseline if you have increased bloating, recurrent vomiting. 6. We suspect you have a delayed stomach emptying, try to eat small frequent meals 5-6 times daily, avoid overeating, avoid fatty foods.   Gastroparesis Gastroparesis, also called delayed gastric emptying, is a condition in which food takes longer than normal to empty from the stomach. The condition is usually long-lasting (chronic). What are the causes? This condition may be caused by:  An endocrine disorder, such as hypothyroidism or diabetes. Diabetes is the most common cause of this condition.  A nervous system disease, such as Parkinson disease or multiple sclerosis.  Cancer, infection, or surgery of the stomach or vagus nerve.  A connective tissue disorder, such as scleroderma.  Certain medicines.  In most cases, the cause is not known. What increases the risk? This condition is more likely to develop in:  People with certain disorders, including endocrine disorders, eating disorders, amyloidosis, and scleroderma.  People with certain diseases, including Parkinson disease or multiple sclerosis.  People with cancer or infection of the stomach or vagus nerve.  People who have had surgery on the stomach or vagus nerve.  People who take certain medicines.  Women.  What are the signs or symptoms? Symptoms of this condition include:  An early feeling of fullness when eating.  Nausea.  Weight loss.  Vomiting.  Heartburn.  Abdominal bloating.  Inconsistent blood glucose levels.  Lack of  appetite.  Acid from the stomach coming up into the esophagus (gastroesophageal reflux).  Spasms of the stomach.  Symptoms may come and go. How is this diagnosed? This condition is diagnosed with tests, such as:  Tests that check how long it takes food to move through the stomach and intestines. These tests include: ? Upper gastrointestinal (GI) series. In this test, X-rays of the intestines are taken after you drink a liquid. The liquid makes the intestines show up better on the X-rays. ? Gastric emptying scintigraphy. In this test, scans are taken after you eat food that contains a small amount of radioactive material. ? Wireless capsule GI monitoring system. This test involves swallowing a capsule that records information about movement through the stomach.  Gastric manometry. This test measures electrical and muscular activity in the stomach. It is done with a thin tube that is passed down the throat and into the stomach.  Endoscopy. This test checks for abnormalities in the lining of the stomach. It is done with a long, thin tube that is passed down the throat and into the stomach.  An ultrasound. This test can help rule out gallbladder disease or pancreatitis as a cause of your symptoms. It uses sound waves to take pictures of the inside of your body.  How is this treated? There is no cure for gastroparesis. This condition may be managed with:  Treatment of the underlying condition causing the gastroparesis.  Lifestyle changes, including exercise and dietary changes. Dietary changes can include: ? Changes in what and when you eat. ? Eating smaller meals more often. ? Eating low-fat foods. ? Eating low-fiber forms of high-fiber foods, such as  cooked vegetables instead of raw vegetables. ? Having liquid foods in place of solid foods. Liquid foods are easier to digest.  Medicines. These may be given to control nausea and vomiting and to stimulate stomach muscles.  Getting food  through a feeding tube. This may be done in severe cases.  A gastric neurostimulator. This is a device that is inserted into the body with surgery. It helps improve stomach emptying and control nausea and vomiting.  Follow these instructions at home:  Follow your health care provider's instructions about exercise and diet.  Take medicines only as directed by your health care provider. Contact a health care provider if:  Your symptoms do not improve with treatment.  You have new symptoms. Get help right away if:  You have severe abdominal pain that does not improve with treatment.  You have nausea that does not go away.  You cannot keep fluids down. This information is not intended to replace advice given to you by your health care provider. Make sure you discuss any questions you have with your health care provider. Document Released: 09/08/2005 Document Revised: 02/14/2016 Document Reviewed: 09/04/2014 Elsevier Interactive Patient Education  2018 [place].  Fatty Liver Fatty liver, also called hepatic steatosis or steatohepatitis, is a condition in which too much fat has built up in your liver cells. The liver removes harmful substances from your bloodstream. It produces fluids your body needs. It also helps your body use and store energy from the food you eat. In many cases, fatty liver does not cause symptoms or problems. It is often diagnosed when tests are being done for other reasons. However, over time, fatty liver can cause inflammation that may lead to more serious liver problems, such as scarring of the liver (cirrhosis). What are the causes? Causes of fatty liver may include:  Drinking too much alcohol.  Poor nutrition.  Obesity.  Cushing syndrome.  Diabetes.  Hyperlipidemia.  Pregnancy.  Certain drugs.  Poisons.  Some viral infections.  What increases the risk? You may be more likely to develop fatty liver if you:  Abuse alcohol.  Are  pregnant.  Are overweight.  Have diabetes.  Have hepatitis.  Have a high triglyceride level.  What are the signs or symptoms? Fatty liver often does not cause any symptoms. In cases where symptoms develop, they can include:  Fatigue.  Weakness.  Weight loss.  Confusion.  Abdominal pain.  Yellowing of your skin and the white parts of your eyes (jaundice).  Nausea and vomiting.  How is this diagnosed? Fatty liver may be diagnosed by:  Physical exam and medical history.  Blood tests.  Imaging tests, such as an ultrasound, CT scan, or MRI.  Liver biopsy. A small sample of liver tissue is removed using a needle. The sample is then looked at under a microscope.  How is this treated? Fatty liver is often caused by other health conditions. Treatment for fatty liver may involve medicines and lifestyle changes to manage conditions such as:  Alcoholism.  High cholesterol.  Diabetes.  Being overweight or obese.  Follow these instructions at home:  Eat a healthy diet as directed by your health care provider.  Exercise regularly. This can help you lose weight and control your cholesterol and diabetes. Talk to your health care provider about an exercise plan and which activities are best for you.  Do not drink alcohol.  Take medicines only as directed by your health care provider. Contact a health care provider if: You have  difficulty controlling your:  Blood sugar.  Cholesterol.  Alcohol consumption.  Get help right away if:  You have abdominal pain.  You have jaundice.  You have nausea and vomiting. This information is not intended to replace advice given to you by your health care provider. Make sure you discuss any questions you have with your health care provider. Document Released: 10/24/2005 Document Revised: 02/14/2016 Document Reviewed: 01/18/2014 Elsevier Interactive Patient Education  [employer].

## 2018-08-23 NOTE — Assessment & Plan Note (Signed)
Hepatomegaly with fatty liver seen on imaging.  LFTs have been normal. Risk factors include obesity, diabetes. Encouraged daily exercise, modest weight loss, tight glycemia control. Continue to monitor LFTs every 6-12 months with PCP. Consider liver ultrasound if increase in LFTs or in a few years to check for progression of disease. Return to the office in one year or sooner if needed.

## 2018-08-23 NOTE — Assessment & Plan Note (Signed)
Reflux esophagitis on EGD, no evidence of Barrett's on bx. Continue pantoprazole once daily. Reinforced antireflux measures. Return to the office in one year.

## 2018-08-23 NOTE — Assessment & Plan Note (Signed)
Stable mild anemia dating back to early 2018. No iron deficiency. Colonoscopy and upper endoscopy up to date. Celiac screen and H.pylori serologies were negative. Recheck labs in four months. If persistently low or decline, would check hemoccult and if positive would offer small bowel capsule study. Likely anemia of chronic disease.

## 2018-08-23 NOTE — Assessment & Plan Note (Signed)
Likely element of delayed gastric emptying in setting of diabetes. Recommend small meals throughout the day as opposed to overeating. If worsening symptoms, consider baseline GES.

## 2018-08-24 ENCOUNTER — Other Ambulatory Visit: Payer: Self-pay

## 2018-08-24 DIAGNOSIS — D649 Anemia, unspecified: Secondary | ICD-10-CM

## 2018-08-24 NOTE — Progress Notes (Signed)
CC'D TO PCP °

## 2018-11-03 ENCOUNTER — Encounter: Payer: Self-pay | Admitting: Internal Medicine

## 2018-11-24 ENCOUNTER — Telehealth: Payer: Self-pay | Admitting: Gastroenterology

## 2018-11-24 ENCOUNTER — Other Ambulatory Visit: Payer: Self-pay

## 2018-11-24 DIAGNOSIS — D649 Anemia, unspecified: Secondary | ICD-10-CM

## 2018-11-24 NOTE — Telephone Encounter (Signed)
See mychart message.   Can we go ahead and arrange for CBC, iron/tibc/ferritin, folate, B12?

## 2018-11-24 NOTE — Telephone Encounter (Signed)
Noted. Labs orders placed and faxed to [facility] per pts request.

## 2018-11-25 ENCOUNTER — Other Ambulatory Visit (HOSPITAL_COMMUNITY)
Admission: RE | Admit: 2018-11-25 | Discharge: 2018-11-25 | Disposition: A | Discharge status: Home / Self Care | Payer: PRIVATE HEALTH INSURANCE | Source: Ambulatory Visit | Attending: Gastroenterology | Admitting: Gastroenterology

## 2018-11-25 ENCOUNTER — Other Ambulatory Visit: Payer: Self-pay

## 2018-11-25 DIAGNOSIS — D649 Anemia, unspecified: Secondary | ICD-10-CM | POA: Diagnosis not present

## 2018-11-25 LAB — CBC WITH DIFFERENTIAL/PLATELET
Abs Immature Granulocytes: 0.03 10*3/uL (ref 0.00–0.07)
BASOS ABS: 0 10*3/uL (ref 0.0–0.1)
BASOS PCT: 0 %
EOS PCT: 2 %
Eosinophils Absolute: 0.2 10*3/uL (ref 0.0–0.5)
HCT: 35.8 % — ABNORMAL LOW (ref 36.0–46.0)
HEMOGLOBIN: 11.1 g/dL — AB (ref 12.0–15.0)
Immature Granulocytes: 0 %
LYMPHS PCT: 29 %
Lymphs Abs: 3.1 10*3/uL (ref 0.7–4.0)
MCH: 27.6 pg (ref 26.0–34.0)
MCHC: 31 g/dL (ref 30.0–36.0)
MCV: 89.1 fL (ref 80.0–100.0)
Monocytes Absolute: 0.4 10*3/uL (ref 0.1–1.0)
Monocytes Relative: 4 %
NRBC: 0 % (ref 0.0–0.2)
Neutro Abs: 7 10*3/uL (ref 1.7–7.7)
Neutrophils Relative %: 65 %
PLATELETS: 330 10*3/uL (ref 150–400)
RBC: 4.02 MIL/uL (ref 3.87–5.11)
RDW: 13.6 % (ref 11.5–15.5)
WBC: 10.8 10*3/uL — ABNORMAL HIGH (ref 4.0–10.5)

## 2018-11-25 LAB — IRON AND TIBC
Iron: 48 ug/dL (ref 28–170)
Saturation Ratios: 13 % (ref 10.4–31.8)
TIBC: 384 ug/dL (ref 250–450)
UIBC: 336 ug/dL

## 2018-11-25 LAB — VITAMIN B12: VITAMIN B 12: 159 pg/mL — AB (ref 180–914)

## 2018-11-25 LAB — FOLATE: FOLATE: 6.4 ng/mL (ref >5.9)

## 2018-11-25 LAB — FERRITIN: Ferritin: 26 ng/mL (ref 11–307)

## 2018-12-28 ENCOUNTER — Telehealth: Payer: Self-pay | Admitting: *Deleted

## 2018-12-28 NOTE — Telephone Encounter (Signed)
[name], let's have this patient's OV moved up to a virtual visit. We haven't seen her in four months and I would prefer for her to have a visit before submitting letter and deciding next step. She also has not submitted ifobt that I can tell.   ----- Message -----  From: Inge Rise, CMA  Sent: 12/27/2018  7:30 AM EDT  To: Mahala Menghini, PA-C  Subject: FW: Non-Urgent Medical Question             ----- Message -----  From: Jeanette Aguirre  Sent: 12/25/2018  1:14 PM EDT  To: [facility] Pool  Subject: Non-Urgent Medical Question             Good afternoon,  Just wanted to touch base , I had another episode Thursday evening. I have an appointment closer to the end of the month. I very much plan to keep it.     Can you give me some kind of letter to put in my file at work RE: this illness and me getting sick out of the blue?  As much as I don't Want to I guess we should move forward with the next test.     Hope y'all are staying safe during this virus.   Abigail Butts

## 2018-12-28 NOTE — Telephone Encounter (Signed)
Message sent to stacey/susan to schedule

## 2018-12-28 NOTE — Telephone Encounter (Signed)
Fowarding to [place] to place on schedule

## 2018-12-28 NOTE — Telephone Encounter (Signed)
Patient scheduled with [facility] tomorrow for [facility]

## 2018-12-29 ENCOUNTER — Encounter: Payer: Self-pay | Admitting: Nurse Practitioner

## 2018-12-29 ENCOUNTER — Ambulatory Visit: Payer: PRIVATE HEALTH INSURANCE | Admitting: Gastroenterology

## 2018-12-29 ENCOUNTER — Ambulatory Visit (INDEPENDENT_AMBULATORY_CARE_PROVIDER_SITE_OTHER): Payer: PRIVATE HEALTH INSURANCE | Admitting: Nurse Practitioner

## 2018-12-29 ENCOUNTER — Other Ambulatory Visit: Payer: Self-pay

## 2018-12-29 DIAGNOSIS — R112 Nausea with vomiting, unspecified: Secondary | ICD-10-CM

## 2018-12-29 DIAGNOSIS — K2100 Gastro-esophageal reflux disease with esophagitis, without bleeding: Secondary | ICD-10-CM

## 2018-12-29 DIAGNOSIS — D649 Anemia, unspecified: Secondary | ICD-10-CM | POA: Diagnosis not present

## 2018-12-29 MED ORDER — ONDANSETRON 4 MG PO TBDP: 4.0000 mg | ORAL_TABLET | Freq: Three times a day (TID) | ORAL | 1 refills | Status: DC | PRN

## 2018-12-29 NOTE — Assessment & Plan Note (Signed)
The patient has a history of normocytic anemia which is been stable over several lab draws.  B12 was found to be low and her primary care started her on B12 injections.  I will not check labs today in order to prevent disrupting her cycle and monitoring parameters.  Hopefully this will help improve her anemia.  Denies overt blood loss.  She is still due to pick up an iFOBT from our office to complete.  She will likely need to wait until after COVID-19/coronavirus pandemic is improved to come in and pick this up.  Follow-up in 4 months

## 2018-12-29 NOTE — Progress Notes (Signed)
Referring Provider: Redmond School, MD Primary Care Physician:  Redmond School, MD Primary GI:  Dr. Gala Romney  NOTE: Service was provided via telemedicine and was requested by the patient due to COVID-19 pandemic.  Method of visit: FaceTime  Patient Location: Home  Provider Location: Office  Reason for Phone Visit: Follow-up  The patient was consented to phone follow-up via telephone encounter including billing of the encounter (yes/no): yes  Persons present on the phone encounter, with roles: ex-Husband  Total time (minutes) spent on medical discussion: 18 minutes  Chief Complaint  Patient presents with  . Nausea    no vomiting in past few days  . Bloated    HPI:   Jeanette Aguirre is a 58 y.o. female who presents for virtual visit regarding: Follow-up for hepatomegaly, anemia, bloating, GERD.  The patient was last seen in our office 08/23/2018 for the same.  Noted history of hepatomegaly in the setting of fatty liver without splenomegaly.  Chronic diarrhea since cholecystectomy in the remote past.  Chronic GERD and previously declined PPI until pending EGD completed.  Mild anemia with hemoglobin in the 11.1 range.  EGD and 03/2018 showed some inflammation esophagus consistent with acid reflux and advised to stay on pantoprazole daily.  Labs show stable anemia with a hemoglobin in the 11 range.  LFTs normal historically.  Hemoglobin A1c elevated 8.9 in June 2019.  Iron and ferritin normal as well.  Celiac serologies and H. pylori antibody negative in March 2019.  Colonoscopy up-to-date with multiple tubular adenomas in 2018 and recommended repeat in December 2021.  At her last visit she stated one week prior she had lip swelling and redness and saw her primary care and there is a concern for angioedema related to lisinopril so this was stopped.  She was treated with doxycycline and steroids and noted improvement at that time.  GERD symptoms significantly improved on pantoprazole  without breakthrough.  Nausea and vomiting is resolved.  Occasionally some bloating if she overeats but generally improved and satisfied with results.  Recommended continue pantoprazole 40 mg daily, recheck labs in 4 months regarding anemia, follow-up with primary care, follow-up with our office in 1 year.  If decides on gastric emptying test send a message to our office.  Gastroparesis diet information provided.  Updated labs completed 11/25/2018 which found mild leukocytosis of 10.8, stable hemoglobin at 11.1, normal folate, low B12 at 159, normal iron and ferritin.  Recommended discussed with primary care about possible B12 deficiency and query need for injections or other supplementation.  The patient sent a MyChart message on 12/08/2018 with 1 day of nausea and vomiting the day prior.  Query viral gastroenteritis.  The next day she was still not feeling well with a low-grade fever and no appetite, felt she was possibly having issues with allergies.  Recommended follow-up with primary care if no better by tomorrow.  She again sent a message to our office on 12/25/2018 indicating another acute episode of vomiting.  She was requesting a work note related to acute onset intermittent vomiting.  She seemed agreeable to proceeding with gastric emptying study.  She still needs an iFOBT completed as well as consider gastric emptying test.  Today she states she's doing well overall. She did start B-12 injections with PCP. Has had 2 episodes of emesis since last visit. Last episode of vomiting was 5 days ago. When it occurs she will have frequent vomiting and dry heaves. Occurs at random without obvious association with  eating. Most of the time she will have pre-emesis nausea. Denies hematemesis. GERD well managed currently. Denies abdominal pain, hematochezia, melena, fever, chills, unintentional weight loss. Denies URI symptoms and flu-like symptoms. Denies chest pain, dyspnea, dizziness, lightheadedness, syncope,  near syncope. Denies any other upper or lower GI symptoms.  Past Medical History:  Diagnosis Date  . Anxiety   . Chronic kidney disease   . Depression   . Diabetes mellitus without complication ([place])   . GERD (gastroesophageal reflux disease)   . Hyperlipemia   . Pneumonia     Past Surgical History:  Procedure Laterality Date  . CHOLECYSTECTOMY    . COLONOSCOPY N/A 09/04/2017   Dr. Gala Romney: multiple tubular adenomas removed, diverticulosis. next TCS in 3 years.   . ESOPHAGOGASTRODUODENOSCOPY N/A 04/09/2018   Procedure: ESOPHAGOGASTRODUODENOSCOPY (EGD);  Surgeon: Daneil Dolin, MD;  Location: [facility];  Service: Endoscopy;  Laterality: N/A;  2:30pm  . TONSILLECTOMY    . TUBAL LIGATION    . TYMPANOSTOMY TUBE PLACEMENT      Current Outpatient Medications  Medication Sig Dispense Refill  . acetaminophen (TYLENOL) 500 MG tablet Take 1,000 mg by mouth every 6 (six) hours as needed for moderate pain or headache.     Marland Kitchen atorvastatin (LIPITOR) 20 MG tablet Take 20 mg by mouth at bedtime.     . Chlorpheniramine Maleate (ALLERGY PO) Take 1 tablet by mouth as needed.     Marland Kitchen FLUoxetine (PROZAC) 20 MG tablet Take 30 mg by mouth daily.    Marland Kitchen glimepiride (AMARYL) 4 MG tablet Take 4 mg by mouth 2 (two) times daily.     Marland Kitchen ibuprofen (ADVIL,MOTRIN) 200 MG tablet Take 400 mg by mouth every 6 (six) hours as needed for headache or moderate pain.    . naproxen sodium (ALEVE) 220 MG tablet Take 220 mg by mouth 2 (two) times daily as needed (for pain or headaches).    . pantoprazole (PROTONIX) 40 MG tablet Take 1 tablet (40 mg total) by mouth daily. Take 30 minutes before breakfast daily 90 tablet 3  . sitaGLIPtin-metformin (JANUMET) 50-1000 MG tablet Take 1 tablet by mouth 2 (two) times daily with a meal.     No current facility-administered medications for this visit.     Allergies as of 12/29/2018 - Review Complete 12/29/2018  Allergen Reaction Noted  . Betadine [povidone iodine] Swelling  10/01/2016  . Ciprofloxacin Nausea Only 11/27/2017  . Lisinopril Swelling 08/23/2018    Family History  Problem Relation Age of Onset  . Diabetes Mother   . Peripheral vascular disease Mother   . Diabetes Father   . Colitis Daughter 80       lymphocytic colitis  . Colon cancer Neg Hx     Social History   Socioeconomic History  . Marital status: Divorced    Spouse name: Not on file  . Number of children: Not on file  . Years of education: Not on file  . Highest education level: Not on file  Occupational History  . Not on file  Social Needs  . Financial resource strain: Not on file  . Food insecurity:    Worry: Not on file    Inability: Not on file  . Transportation needs:    Medical: Not on file    Non-medical: Not on file  Tobacco Use  . Smoking status: Former Smoker    Last attempt to quit: 09/22/2016    Years since quitting: 2.2  . Smokeless tobacco: Never Used  Substance and Sexual Activity  . Alcohol use: Not Currently    Comment: very little  . Drug use: No  . Sexual activity: Not on file  Lifestyle  . Physical activity:    Days per week: Not on file    Minutes per session: Not on file  . Stress: Not on file  Relationships  . Social connections:    Talks on phone: Not on file    Gets together: Not on file    Attends religious service: Not on file    Active member of club or organization: Not on file    Attends meetings of clubs or organizations: Not on file    Relationship status: Not on file  Other Topics Concern  . Not on file  Social History Narrative  . Not on file    Review of Systems: General: Negative for anorexia, weight loss, fever, chills, fatigue, weakness. Eyes: Negative for vision changes.  ENT: Negative for hoarseness, difficulty swallowing , nasal congestion. CV: Negative for chest pain, angina, palpitations, dyspnea on exertion, peripheral edema.  Respiratory: Negative for dyspnea at rest, dyspnea on exertion, cough, sputum,  wheezing.  GI: See history of present illness. GU:  Negative for dysuria, hematuria, urinary incontinence, urinary frequency, nocturnal urination.  MS: Negative for joint pain, low back pain.  Derm: Negative for rash or itching.  Neuro: Negative for weakness, abnormal sensation, seizure, frequent headaches, memory loss, confusion.  Psych: Negative for anxiety, depression, suicidal ideation, hallucinations.  Endo: Negative for unusual weight change.  Heme: Negative for bruising or bleeding. Allergy: Negative for rash or hives.  Physical Exam: Note: limited exam due to virtual visit General:   Alert and oriented. Pleasant and cooperative. Well-nourished and well-developed.  Head:  Normocephalic and atraumatic. Eyes:  Without icterus, sclera clear and conjunctiva pink.  Ears:  Normal auditory acuity. Skin:  Intact without facial significant lesions or rashes. Neurologic:  Alert and oriented x4;  grossly normal neurologically. Psych:  Alert and cooperative. Normal mood and affect. Heme/Lymph/Immune: No excessive bruising noted.

## 2018-12-29 NOTE — Assessment & Plan Note (Signed)
GERD symptoms remain essentially resolved on PPI.  She was concerned about possible esophageal damage with vomiting, although it only occurs about once every month or 2.  I discussed is not likely especially given Protonix.  Recommend she continue her current medications and follow-up in 4 months.

## 2018-12-29 NOTE — Addendum Note (Signed)
Addended by: Gordy Levan, Nahshon Reich A on: 12/29/2018 04:15 PM   Modules accepted: Orders

## 2018-12-29 NOTE — Assessment & Plan Note (Signed)
Persistent nausea vomiting.  She is had 2 episodes in the last office visit.  She typically has a prodromal episode of nausea followed by emesis for multiple times to the point of dry heaving.  She has nausea medication on hand but is not ODT formulation.  I will send in Zofran 4 mg ODT that she can take every 8 hours as needed to help prevent vomiting if she becomes nauseous for to at least shorten the duration of her vomiting.  She is amenable to having gastric emptying study completed this time.  I will put in the order but we discussed the likelihood it would not be completed anytime soon due to COVID-19/coronavirus pandemic and need to hold off on elective imaging.  She understands this.  Return for follow-up in 4 months.

## 2018-12-29 NOTE — Patient Instructions (Addendum)
Your health issues we discussed today were:   Nausea and vomiting: 1. I have sent in Zofran dissolvable tablets 4 mg to your pharmacy.  You can take this up to every 8 hours as needed for nausea/vomiting or to shorten the duration of vomiting 2. We will order a gastric emptying study.  Radiology for our office will reach out to you for scheduling. 3. Notify us if you have worsening symptoms  Anemia: 1. Continue follow-up with your primary care provider for B12 injections 2. They can monitor your anemia on B12. 3. I will not check labs on you today to try to prevent interfering with their monitoring schedule  GERD (heartburn): 1. Continue taking Protonix. 2. I am glad your symptoms are better 3. Call us if you have significant worsening  Overall I recommend:  1. Return for follow-up in 4 months 2. Call if you have any questions or concerns   Because of recent events of COVID-19 ("Coronavirus"), follow CDC recommendations:  Wash your hand frequently Avoid touching your face Stay away from people who are sick If you have symptoms such as fever, cough, shortness of breath then call your healthcare provider for further guidance If you are sick, STAY AT HOME unless otherwise directed by your healthcare provider. Follow directions from state and national officials regarding staying safe   At [facility] we value your feedback. You may receive a survey about your visit today. Please share your experience as we strive to create trusting relationships with our patients to provide genuine, compassionate, quality care.  We appreciate your understanding and patience as we review any laboratory studies, imaging, and other diagnostic tests that are ordered as we care for you. Our office policy is 5 business days for review of these results, and any emergent or urgent results are addressed in a timely manner for your best interest. If you do not hear from our office in 1 week,  please contact us.   We also encourage the use of MyChart, which contains your medical information for your review as well. If you are not enrolled in this feature, an access code is on this after visit summary for your convenience. Thank you for allowing [place] to be involved in your care.  It was great to see you today!  I hope you have a great day!!

## 2018-12-30 ENCOUNTER — Encounter: Payer: Self-pay | Admitting: Internal Medicine

## 2018-12-30 NOTE — Progress Notes (Signed)
cc'ed to pcp °

## 2019-01-05 ENCOUNTER — Telehealth: Payer: Self-pay | Admitting: Internal Medicine

## 2019-01-05 NOTE — Telephone Encounter (Signed)
I put FMLA papers in [facility] office to be completed.

## 2019-01-11 ENCOUNTER — Ambulatory Visit: Payer: PRIVATE HEALTH INSURANCE | Admitting: Gastroenterology

## 2019-01-11 ENCOUNTER — Telehealth: Payer: Self-pay | Admitting: Internal Medicine

## 2019-01-11 NOTE — Telephone Encounter (Signed)
Pt sent a message via Mychart asking when her FMLA papers would be done. Let me know and I will call her.

## 2019-01-18 ENCOUNTER — Telehealth: Payer: Self-pay | Admitting: Internal Medicine

## 2019-01-18 NOTE — Telephone Encounter (Signed)
Pt sent a message via Mychart asking when her FMLA papers would be done. Let me know and I will call her

## 2019-01-18 NOTE — Telephone Encounter (Signed)
Papers are at the front to call patient. I explained she can come pick them up and someone will meet her at the door.

## 2019-01-19 NOTE — Telephone Encounter (Signed)
Patient is aware of $29 fee and to call when she arrives.

## 2019-01-31 ENCOUNTER — Telehealth: Payer: Self-pay | Admitting: *Deleted

## 2019-01-31 NOTE — Telephone Encounter (Signed)
GES scheduled for 5/18 at 8am, arrival time 7:45am, npo midnight, no stomach medications, test can take up to 4 hours. I have sent patient [place] with instructions.   Called [employer] and Spoke with Robin A. Was advised no PA was required for GES. Call ref # [id number].

## 2019-02-07 ENCOUNTER — Encounter (HOSPITAL_COMMUNITY): Payer: Self-pay

## 2019-02-07 ENCOUNTER — Other Ambulatory Visit: Payer: Self-pay

## 2019-02-07 ENCOUNTER — Encounter (HOSPITAL_COMMUNITY)
Admission: RE | Admit: 2019-02-07 | Discharge: 2019-02-07 | Disposition: A | Discharge status: Home / Self Care | Payer: PRIVATE HEALTH INSURANCE | Source: Ambulatory Visit | Attending: Nurse Practitioner | Admitting: Nurse Practitioner

## 2019-02-07 DIAGNOSIS — K2100 Gastro-esophageal reflux disease with esophagitis, without bleeding: Secondary | ICD-10-CM

## 2019-02-07 DIAGNOSIS — D649 Anemia, unspecified: Secondary | ICD-10-CM | POA: Insufficient documentation

## 2019-02-07 DIAGNOSIS — R112 Nausea with vomiting, unspecified: Secondary | ICD-10-CM | POA: Diagnosis present

## 2019-02-07 MED ORDER — TECHNETIUM TC 99M SULFUR COLLOID
2.0000 | Freq: Once | INTRAVENOUS | Status: AC | PRN
  Administered 2019-02-07: 08:00:00 2.2 via INTRAVENOUS

## 2019-02-28 NOTE — Progress Notes (Signed)
Gastroparesis diet mailed to pt.

## 2019-04-04 ENCOUNTER — Other Ambulatory Visit: Payer: Self-pay | Admitting: Nurse Practitioner

## 2019-04-04 DIAGNOSIS — D649 Anemia, unspecified: Secondary | ICD-10-CM

## 2019-04-04 DIAGNOSIS — R112 Nausea with vomiting, unspecified: Secondary | ICD-10-CM

## 2019-04-04 DIAGNOSIS — K2100 Gastro-esophageal reflux disease with esophagitis, without bleeding: Secondary | ICD-10-CM

## 2019-04-04 MED ORDER — ONDANSETRON 4 MG PO TBDP: 4.0000 mg | ORAL_TABLET | Freq: Three times a day (TID) | ORAL | 3 refills | Status: DC | PRN

## 2019-04-04 NOTE — Progress Notes (Signed)
Rx to pharmacy per patient email request.

## 2019-04-25 ENCOUNTER — Other Ambulatory Visit (HOSPITAL_COMMUNITY): Payer: Self-pay | Admitting: Internal Medicine

## 2019-04-25 DIAGNOSIS — Z1231 Encounter for screening mammogram for malignant neoplasm of breast: Secondary | ICD-10-CM

## 2019-05-02 ENCOUNTER — Other Ambulatory Visit: Payer: Self-pay

## 2019-05-02 ENCOUNTER — Ambulatory Visit (HOSPITAL_COMMUNITY)
Admission: RE | Admit: 2019-05-02 | Discharge: 2019-05-02 | Disposition: A | Discharge status: Home / Self Care | Payer: PRIVATE HEALTH INSURANCE | Source: Ambulatory Visit | Attending: Internal Medicine | Admitting: Internal Medicine

## 2019-05-02 DIAGNOSIS — Z1231 Encounter for screening mammogram for malignant neoplasm of breast: Secondary | ICD-10-CM

## 2019-05-10 ENCOUNTER — Other Ambulatory Visit: Payer: Self-pay

## 2019-05-10 ENCOUNTER — Encounter: Payer: Self-pay | Admitting: Gastroenterology

## 2019-05-10 ENCOUNTER — Ambulatory Visit (INDEPENDENT_AMBULATORY_CARE_PROVIDER_SITE_OTHER): Payer: PRIVATE HEALTH INSURANCE | Admitting: Gastroenterology

## 2019-05-10 VITALS — BP 130/72 | HR 71 | Temp 97.0°F | Ht 66.0 in | Wt 262.4 lb

## 2019-05-10 DIAGNOSIS — D649 Anemia, unspecified: Secondary | ICD-10-CM

## 2019-05-10 DIAGNOSIS — R112 Nausea with vomiting, unspecified: Secondary | ICD-10-CM | POA: Diagnosis not present

## 2019-05-10 DIAGNOSIS — K760 Fatty (change of) liver, not elsewhere classified: Secondary | ICD-10-CM

## 2019-05-10 DIAGNOSIS — K219 Gastro-esophageal reflux disease without esophagitis: Secondary | ICD-10-CM

## 2019-05-10 DIAGNOSIS — R160 Hepatomegaly, not elsewhere classified: Secondary | ICD-10-CM | POA: Diagnosis not present

## 2019-05-10 NOTE — Assessment & Plan Note (Signed)
Doing well.  Continue pantoprazole

## 2019-05-10 NOTE — Assessment & Plan Note (Signed)
Doing better.  Documented gastroparesis as outlined above.  Discussed importance of controlling her diabetes.  If develops nausea and or bloating, transition to soft/liquid diet until symptoms improved.  Hopefully she will be able to control vomiting episodes being proactive.

## 2019-05-10 NOTE — Assessment & Plan Note (Signed)
Mild normocytic anemia.  Low normal ferritin.  B12 was low, currently on injections.  Follow-up labs in November when she presents for PCP labs.

## 2019-05-10 NOTE — Patient Instructions (Signed)
Take our orders with you when you go for labs in 07/2019 for your PCP. We will contact you with results as available.  Try to get your diabetes under control, lose 15 pounds, exercise regularly for management of your fatty liver.   Fatty Liver Disease  Fatty liver disease occurs when too much fat has built up in your liver cells. Fatty liver disease is also called hepatic steatosis or steatohepatitis. The liver removes harmful substances from your bloodstream and produces fluids that your body needs. It also helps your body use and store energy from the food you eat. In many cases, fatty liver disease does not cause symptoms or problems. It is often diagnosed when tests are being done for other reasons. However, over time, fatty liver can cause inflammation that may lead to more serious liver problems, such as scarring of the liver (cirrhosis) and liver failure. Fatty liver is associated with insulin resistance, increased body fat, high blood pressure (hypertension), and high cholesterol. These are features of metabolic syndrome and increase your risk for stroke, diabetes, and heart disease. What are the causes? This condition may be caused by:  Drinking too much alcohol.  Poor nutrition.  Obesity.  Cushing's syndrome.  Diabetes.  High cholesterol.  Certain drugs.  Poisons.  Some viral infections.  Pregnancy. What increases the risk? You are more likely to develop this condition if you:  Abuse alcohol.  Are overweight.  Have diabetes.  Have hepatitis.  Have a high triglyceride level.  Are pregnant. What are the signs or symptoms? Fatty liver disease often does not cause symptoms. If symptoms do develop, they can include:  Fatigue.  Weakness.  Weight loss.  Confusion.  Abdominal pain.  Nausea and vomiting.  Yellowing of your skin and the white parts of your eyes (jaundice).  Itchy skin. How is this diagnosed? This condition may be diagnosed by:  A  physical exam and medical history.  Blood tests.  Imaging tests, such as an ultrasound, CT scan, or MRI.  A liver biopsy. A small sample of liver tissue is removed using a needle. The sample is then looked at under a microscope. How is this treated? Fatty liver disease is often caused by other health conditions. Treatment for fatty liver may involve medicines and lifestyle changes to manage conditions such as:  Alcoholism.  High cholesterol.  Diabetes.  Being overweight or obese. Follow these instructions at home:   Do not drink alcohol. If you have trouble quitting, ask your health care provider how to safely quit with the help of medicine or a supervised program. This is important to keep your condition from getting worse.  Eat a healthy diet as told by your health care provider. Ask your health care provider about working with a diet and nutrition specialist (dietitian) to develop an eating plan.  Exercise regularly. This can help you lose weight and control your cholesterol and diabetes. Talk to your health care provider about an exercise plan and which activities are best for you.  Take over-the-counter and prescription medicines only as told by your health care provider.  Keep all follow-up visits as told by your health care provider. This is important. Contact a health care provider if: You have trouble controlling your:  Blood sugar. This is especially important if you have diabetes.  Cholesterol.  Drinking of alcohol. Get help right away if:  You have abdominal pain.  You have jaundice.  You have nausea and vomiting.  You vomit blood or material that  looks like coffee grounds.  You have stools that are black, tar-like, or bloody. Summary  Fatty liver disease develops when too much fat builds up in the cells of your liver.  Fatty liver disease often causes no symptoms or problems. However, over time, fatty liver can cause inflammation that may lead to more  serious liver problems, such as scarring of the liver (cirrhosis).  You are more likely to develop this condition if you abuse alcohol, are pregnant, are overweight, have diabetes, have hepatitis, or have high triglyceride levels.  Contact your health care provider if you have trouble controlling your weight, blood sugar, cholesterol, or drinking of alcohol. This information is not intended to replace advice given to you by your health care provider. Make sure you discuss any questions you have with your health care provider. Document Released: 10/24/2005 Document Revised: 08/21/2017 Document Reviewed: 06/17/2017 Elsevier Patient Education  2020 [employer].

## 2019-05-10 NOTE — Progress Notes (Signed)
Primary Care Physician: Redmond School, MD  Primary Gastroenterologist:  Garfield Cornea, MD   Chief Complaint  Patient presents with  . Nausea    occas but has much improved. No vomiting in a few months  . Gastroesophageal Reflux    HPI: Jeanette Aguirre is a 58 y.o. female here for follow-up of hepatomegaly with fatty liver, anemia, bloating, GERD.  Last seen in April, virtual visit.  Chronic diarrhea since cholecystectomy in the remote past.  EGD July 2019, inflammation of the esophagus consistent with acid reflux.  Celiac serologies and H. pylori antibody negative in March 2019.  Colonoscopy in 2018, multiple tubular adenomas, repeat recommended in December 2021.  Labs in March 2020 with mild leukocytosis of 10,800, stable hemoglobin 11.1, normal folate, low B12 at 159, normal iron and low normal ferritin.  Advised to follow-up with PCP for possible B12 deficiency.  Has since started B12 injections.  Gastric emptying study May 2020, normal emptying at 1 in 2 hours but overall delayed gastric emptying study.  Overall she has been feeling better.  She has had only one episode of vomiting in the past 4 months.  She has some postprandial fecal urgency but no incontinence.  Tends to happen about 30 minutes after meals.  Sometimes solid stool only, other times a couple loose stools.  No abdominal pain.  No heartburn.  She has been working on trying to get her diabetes under control.  She meets with specialist again on Thursday.  She is due for labs in November 2020 with PCP, scheduled for physical.  Weight is stable over the past year.   Current Outpatient Medications  Medication Sig Dispense Refill  . acetaminophen (TYLENOL) 500 MG tablet Take 1,000 mg by mouth every 6 (six) hours as needed for moderate pain or headache.     Marland Kitchen atorvastatin (LIPITOR) 20 MG tablet Take 20 mg by mouth at bedtime.     . Chlorpheniramine Maleate (ALLERGY PO) Take 1 tablet by mouth as needed.     Marland Kitchen  FLUoxetine (PROZAC) 20 MG tablet Take 30 mg by mouth daily.    Marland Kitchen glimepiride (AMARYL) 4 MG tablet Take 4 mg by mouth 2 (two) times daily.     Marland Kitchen ibuprofen (ADVIL,MOTRIN) 200 MG tablet Take 400 mg by mouth every 6 (six) hours as needed for headache or moderate pain.    . naproxen sodium (ALEVE) 220 MG tablet Take 220 mg by mouth 2 (two) times daily as needed (for pain or headaches).    . ondansetron (ZOFRAN ODT) 4 MG disintegrating tablet Take 1 tablet (4 mg total) by mouth every 8 (eight) hours as needed for nausea or vomiting. 30 tablet 3  . pantoprazole (PROTONIX) 40 MG tablet Take 1 tablet (40 mg total) by mouth daily. Take 30 minutes before breakfast daily 90 tablet 3  . sitaGLIPtin-metformin (JANUMET) 50-1000 MG tablet Take 1 tablet by mouth 2 (two) times daily with a meal.     No current facility-administered medications for this visit.     Allergies as of 05/10/2019 - Review Complete 05/10/2019  Allergen Reaction Noted  . Betadine [povidone iodine] Swelling 10/01/2016  . Ciprofloxacin Nausea Only 11/27/2017  . Lisinopril Swelling 08/23/2018    ROS:  General: Negative for anorexia, weight loss, fever, chills, fatigue, weakness. ENT: Negative for hoarseness, difficulty swallowing , nasal congestion. CV: Negative for chest pain, angina, palpitations, dyspnea on exertion, peripheral edema.  Respiratory: Negative for dyspnea at rest, dyspnea on exertion,  cough, sputum, wheezing.  GI: See history of present illness. GU:  Negative for dysuria, hematuria, urinary incontinence, urinary frequency, nocturnal urination.  Endo: Negative for unusual weight change.    Physical Examination:   BP 130/72   Pulse 71   Temp (!) 97 F (36.1 C) (Oral)   Ht 5\' 6"  (1.676 m)   Wt 262 lb 6.4 oz (119 kg)   LMP 03/03/2012   BMI 42.35 kg/m   General: Well-nourished, well-developed in no acute distress.  Eyes: No icterus. Mouth: Oropharyngeal mucosa moist and pink , no lesions erythema or exudate.  Lungs: Clear to auscultation bilaterally.  Heart: Regular rate and rhythm, no murmurs rubs or gallops.  Abdomen: Bowel sounds are normal, nontender, nondistended, no hepatosplenomegaly or masses, no abdominal bruits or hernia , no rebound or guarding.   Extremities: No lower extremity edema. No clubbing or deformities. Neuro: Alert and oriented x 4   Skin: Warm and dry, no jaundice.   Psych: Alert and cooperative, normal mood and affect.  Labs:  Lab Results  Component Value Date   FOLATE 6.4 11/25/2018   Lab Results  Component Value Date   FERRITIN 26 11/25/2018   Lab Results  Component Value Date   IRON 48 11/25/2018   TIBC 384 11/25/2018   FERRITIN 26 11/25/2018   Lab Results  Component Value Date   WBC 10.8 (H) 11/25/2018   HGB 11.1 (L) 11/25/2018   HCT 35.8 (L) 11/25/2018   MCV 89.1 11/25/2018   PLT 330 11/25/2018   Lab Results  Component Value Date   ALT 25 07/29/2018   AST 22 07/29/2018   ALKPHOS 107 07/29/2018   BILITOT 0.4 07/29/2018    Lab Results  Component Value Date   CREATININE 0.62 07/29/2018   BUN 12 07/29/2018   NA 139 07/29/2018   K 4.0 07/29/2018   CL 106 07/29/2018   CO2 23 07/29/2018    Imaging Studies: Mm 3d Screen Breast Bilateral  Result Date: 05/03/2019 CLINICAL DATA:  Screening. EXAM: DIGITAL SCREENING BILATERAL MAMMOGRAM WITH TOMO AND CAD COMPARISON:  Previous exam(s). ACR Breast Density Category b: There are scattered areas of fibroglandular density. FINDINGS: There are no findings suspicious for malignancy. Images were processed with CAD. IMPRESSION: No mammographic evidence of malignancy. A result letter of this screening mammogram will be mailed directly to the patient. RECOMMENDATION: Screening mammogram in one year. (Code:SM-B-01Y) BI-RADS CATEGORY  1: Negative. Electronically Signed   By: Lajean Manes M.D.   On: 05/03/2019 12:29

## 2019-05-10 NOTE — Assessment & Plan Note (Signed)
Hepatomegaly with fatty liver.  Encouraged daily exercise, modest weight loss with goal initially of 10 to 15 pounds over the next several months, tight glycemic control.  We will update labs in November.  If increase in LFTs, would offer abdominal ultrasound.  Further recommendations to follow.

## 2019-06-01 ENCOUNTER — Other Ambulatory Visit: Payer: Self-pay | Admitting: Gastroenterology

## 2019-07-05 IMAGING — NM NUCLEAR MEDICINE GASTRIC EMPTYING STUDY
10 series · 10 of 10 positions shown · non-contrast
Comparison: None.

CLINICAL DATA: Abdominal pain with nausea and vomiting

EXAM:
NUCLEAR MEDICINE GASTRIC EMPTYING SCAN
TECHNIQUE: After oral ingestion of radiolabeled meal, sequential abdominal
images were obtained for 4 hours. Percentage of activity emptying
the stomach was calculated at 1 hour, 2 hour, 3 hour, and 4 hours.
RADIOPHARMACEUTICALS:  2.2 mCi 8c-LLm sulfur colloid in standardized
meal including egg

[Series 1: 0 min · 4.14mm/px · 1 of 1 slices shown (1 of 2)]
[im 1/1]
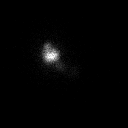

[Series 1: 0 min · 4.14mm/px · 1 of 1 slices shown (2 of 2)]
[im 1/1]
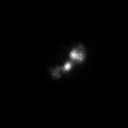

[Series 2: 60 min · 4.14mm/px · 1 of 1 slices shown (1 of 2)]
[im 1/1]
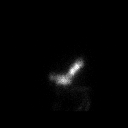

[Series 2: 60 min · 4.14mm/px · 1 of 1 slices shown (2 of 2)]
[im 1/1  full-range]
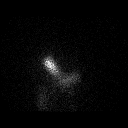

[Series 3: 120 min · 4.14mm/px · 1 of 1 slices shown (1 of 2)]
[im 1/1]
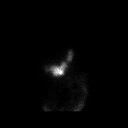

[Series 3: 120 min · 4.14mm/px · 1 of 1 slices shown (2 of 2)]
[im 1/1  full-range]
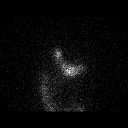

[Series 4: 180 min · 4.14mm/px · 1 of 1 slices shown (1 of 2)]
[im 1/1]
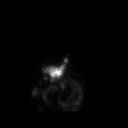

[Series 4: 180 min · 4.14mm/px · 1 of 1 slices shown (2 of 2)]
[im 1/1  full-range]
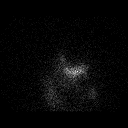

[Series 5: 240 min · 4.14mm/px · 1 of 1 slices shown (1 of 2)]
[im 1/1]
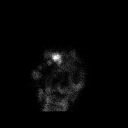

[Series 5: 240 min · 4.14mm/px · 1 of 1 slices shown (2 of 2)]
[im 1/1  full-range]
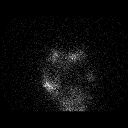

[10 of 10 positions shown; findings below may reference images not displayed]

FINDINGS: Expected location of the stomach in the left upper quadrant.
Ingested meal empties the stomach gradually over the course of the
study.

29.5% emptied at 1 hr ( normal >= 10%)

50.8% emptied at 2 hr ( normal >= 40%)

55.6% emptied at 3 hr ( normal >= 70%)

77.8 % emptied at 4 hr ( normal >= 90%)
IMPRESSION: Delayed gastric emptying study.

## 2019-08-16 LAB — COMPREHENSIVE METABOLIC PANEL
ALT: 25 IU/L (ref 0–32)
AST: 20 IU/L (ref 0–40)
Albumin/Globulin Ratio: 1.8 (ref 1.2–2.2)
Albumin: 4.3 g/dL (ref 3.8–4.9)
Alkaline Phosphatase: 129 IU/L — ABNORMAL HIGH (ref 39–117)
BUN/Creatinine Ratio: 15 (ref 9–23)
BUN: 10 mg/dL (ref 6–24)
Bilirubin Total: 0.3 mg/dL (ref 0.0–1.2)
CO2: 22 mmol/L (ref 20–29)
Calcium: 9.1 mg/dL (ref 8.7–10.2)
Chloride: 97 mmol/L (ref 96–106)
Creatinine, Ser: 0.68 mg/dL (ref 0.57–1.00)
GFR calc Af Amer: 113 mL/min/{1.73_m2} (ref >59)
GFR calc non Af Amer: 98 mL/min/{1.73_m2} (ref >59)
Globulin, Total: 2.4 g/dL (ref 1.5–4.5)
Glucose: 151 mg/dL — ABNORMAL HIGH (ref 65–99)
Potassium: 4.3 mmol/L (ref 3.5–5.2)
Sodium: 138 mmol/L (ref 134–144)
Total Protein: 6.7 g/dL (ref 6.0–8.5)

## 2019-08-16 LAB — IRON,TIBC AND FERRITIN PANEL
Ferritin: 36 ng/mL (ref 15–150)
Iron Saturation: 10 % — ABNORMAL LOW (ref 15–55)
Iron: 32 ug/dL (ref 27–159)
Total Iron Binding Capacity: 308 ug/dL (ref 250–450)
UIBC: 276 ug/dL (ref 131–425)

## 2019-08-16 LAB — CBC WITH DIFFERENTIAL/PLATELET
Basophils Absolute: 0.1 10*3/uL (ref 0.0–0.2)
Basos: 1 %
EOS (ABSOLUTE): 0.4 10*3/uL (ref 0.0–0.4)
Eos: 3 %
Hematocrit: 35.8 % (ref 34.0–46.6)
Hemoglobin: 11.5 g/dL (ref 11.1–15.9)
Immature Grans (Abs): 0 10*3/uL (ref 0.0–0.1)
Immature Granulocytes: 0 %
Lymphocytes Absolute: 4 10*3/uL — ABNORMAL HIGH (ref 0.7–3.1)
Lymphs: 38 %
MCH: 27 pg (ref 26.6–33.0)
MCHC: 32.1 g/dL (ref 31.5–35.7)
MCV: 84 fL (ref 79–97)
Monocytes Absolute: 0.4 10*3/uL (ref 0.1–0.9)
Monocytes: 4 %
Neutrophils Absolute: 5.8 10*3/uL (ref 1.4–7.0)
Neutrophils: 54 %
Platelets: 349 10*3/uL (ref 150–450)
RBC: 4.26 x10E6/uL (ref 3.77–5.28)
RDW: 13.7 % (ref 11.7–15.4)
WBC: 10.6 10*3/uL (ref 3.4–10.8)

## 2019-08-22 ENCOUNTER — Other Ambulatory Visit: Payer: Self-pay

## 2019-08-22 DIAGNOSIS — R748 Abnormal levels of other serum enzymes: Secondary | ICD-10-CM

## 2019-10-24 ENCOUNTER — Other Ambulatory Visit: Payer: Self-pay

## 2019-10-24 DIAGNOSIS — R748 Abnormal levels of other serum enzymes: Secondary | ICD-10-CM

## 2019-11-17 ENCOUNTER — Encounter: Payer: Self-pay | Admitting: Gastroenterology

## 2019-11-19 LAB — CBC WITH DIFFERENTIAL/PLATELET
Basophils Absolute: 0 10*3/uL (ref 0.0–0.2)
Basos: 0 %
EOS (ABSOLUTE): 0.2 10*3/uL (ref 0.0–0.4)
Eos: 2 %
Hematocrit: 34.8 % (ref 34.0–46.6)
Hemoglobin: 11.4 g/dL (ref 11.1–15.9)
Immature Grans (Abs): 0 10*3/uL (ref 0.0–0.1)
Immature Granulocytes: 0 %
Lymphocytes Absolute: 2.9 10*3/uL (ref 0.7–3.1)
Lymphs: 35 %
MCH: 27.5 pg (ref 26.6–33.0)
MCHC: 32.8 g/dL (ref 31.5–35.7)
MCV: 84 fL (ref 79–97)
Monocytes Absolute: 0.4 10*3/uL (ref 0.1–0.9)
Monocytes: 4 %
Neutrophils Absolute: 4.8 10*3/uL (ref 1.4–7.0)
Neutrophils: 59 %
Platelets: 323 10*3/uL (ref 150–450)
RBC: 4.15 x10E6/uL (ref 3.77–5.28)
RDW: 13.5 % (ref 11.7–15.4)
WBC: 8.4 10*3/uL (ref 3.4–10.8)

## 2019-11-19 LAB — FERRITIN: Ferritin: 29 ng/mL (ref 15–150)

## 2019-11-19 LAB — HEPATIC FUNCTION PANEL
ALT: 21 IU/L (ref 0–32)
AST: 17 IU/L (ref 0–40)
Albumin: 4.5 g/dL (ref 3.8–4.9)
Alkaline Phosphatase: 119 IU/L — ABNORMAL HIGH (ref 39–117)
Bilirubin Total: 0.3 mg/dL (ref 0.0–1.2)
Bilirubin, Direct: 0.12 mg/dL (ref 0.00–0.40)
Total Protein: 7.2 g/dL (ref 6.0–8.5)

## 2019-11-20 NOTE — Progress Notes (Signed)
Primary Care Physician: Redmond School, MD  Primary Gastroenterologist:  Garfield Cornea, MD   Chief Complaint  Patient presents with  . Nausea    with vomiting had episode last week    HPI: Jeanette Aguirre is a 59 y.o. female here for follow up. Last seen in 04/2019. H/O hepatomegaly with fatty liver, anemia, GERD.   EGD July 2019, inflammation of the esophagus consistent with acid reflux.  Celiac serologies and H. pylori antibody negative in March 2019.  Colonoscopy in 2018, multiple tubular adenomas, repeat recommended in December 2021.  GES 01/2019: normal emptying at 1 and 2 hours, but overal delayed.   Overall doing well.  Continues to have infrequent episodes of vomiting and/or diarrhea.  Last episode 1 week ago.  Ate around 2 PM prior to the end of her shift she started having vomiting.  A lot of heaving.  Abdominal soreness afterwards.  Symptoms lasted for less than 24 hours.  Zofran did not seem to help this time.  Had increased heartburn during the episode and took an additional pantoprazole.  In between episodes she feels fine.  In between episodes she does well.  Regular bowel movements.  No blood in the stool or melena.  Heartburn well controlled.  Weight has been stable.  Sometimes episodes associated with loose stools.  States she checks her sugars daily and they seem to be better.  She is overdue to follow-up with PCP, no recent A1c's.  Sometimes has some soreness in the upper abdomen.  Not sure whether affected by meals or related to bowel movements or other activities.  Current Outpatient Medications  Medication Sig Dispense Refill  . acetaminophen (TYLENOL) 500 MG tablet Take 1,000 mg by mouth every 6 (six) hours as needed for moderate pain or headache.     Marland Kitchen atorvastatin (LIPITOR) 20 MG tablet Take 20 mg by mouth at bedtime.     . Chlorpheniramine Maleate (ALLERGY PO) Take 1 tablet by mouth as needed.     Marland Kitchen FLUoxetine (PROZAC) 20 MG tablet Take 40 mg by mouth daily.      Marland Kitchen glimepiride (AMARYL) 4 MG tablet Take 4 mg by mouth 2 (two) times daily.     Marland Kitchen ibuprofen (ADVIL,MOTRIN) 200 MG tablet Take 400 mg by mouth every 6 (six) hours as needed for headache or moderate pain.    . naproxen sodium (ALEVE) 220 MG tablet Take 220 mg by mouth 2 (two) times daily as needed (for pain or headaches).    . ondansetron (ZOFRAN ODT) 4 MG disintegrating tablet Take 1 tablet (4 mg total) by mouth every 8 (eight) hours as needed for nausea or vomiting. 30 tablet 3  . pantoprazole (PROTONIX) 40 MG tablet TAKE 1 TABLET BY MOUTH ONCE DAILY 30 MINUTES BEFORE BREAKFAST 90 tablet 3  . sitaGLIPtin-metformin (JANUMET) 50-1000 MG tablet Take 1 tablet by mouth 2 (two) times daily with a meal.     No current facility-administered medications for this visit.    Allergies as of 11/21/2019 - Review Complete 11/21/2019  Allergen Reaction Noted  . Betadine [povidone iodine] Swelling 10/01/2016  . Ciprofloxacin Nausea Only 11/27/2017  . Lisinopril Swelling 08/23/2018    ROS:  General: Negative for anorexia, weight loss, fever, chills, fatigue, weakness. ENT: Negative for hoarseness, difficulty swallowing , nasal congestion. CV: Negative for chest pain, angina, palpitations, dyspnea on exertion, peripheral edema.  Respiratory: Negative for dyspnea at rest, dyspnea on exertion, cough, sputum, wheezing.  GI: See history of  present illness. GU:  Negative for dysuria, hematuria, urinary incontinence, urinary frequency, nocturnal urination.  Endo: Negative for unusual weight change.    Physical Examination:   BP 123/72   Pulse 78   Temp (!) 96.9 F (36.1 C) (Oral)   Ht 5\' 6"  (1.676 m)   Wt 256 lb (116.1 kg)   LMP 03/03/2012   BMI 41.32 kg/m   General: Well-nourished, well-developed in no acute distress.  Eyes: No icterus. Mouth: masked Abdomen: Bowel sounds are normal, nontender, mild diastases recti, nondistended, no splenomegaly or masses, no abdominal bruits or hernia , no  rebound or guarding.  Exam somewhat limited by body habitus Extremities: No lower extremity edema. No clubbing or deformities. Neuro: Alert and oriented x 4   Skin: Warm and dry, no jaundice.   Psych: Alert and cooperative, normal mood and affect.  Labs:   Lab Results  Component Value Date   WBC 8.4 11/18/2019   HGB 11.4 11/18/2019   HCT 34.8 11/18/2019   MCV 84 11/18/2019   PLT 323 11/18/2019   Lab Results  Component Value Date   ALT 21 11/18/2019   AST 17 11/18/2019   ALKPHOS 119 (H) 11/18/2019   BILITOT 0.3 11/18/2019   Lab Results  Component Value Date   IRON 32 08/15/2019   TIBC 308 08/15/2019   FERRITIN 29 11/18/2019       Imaging Studies: No results found.

## 2019-11-21 ENCOUNTER — Other Ambulatory Visit: Payer: Self-pay

## 2019-11-21 ENCOUNTER — Ambulatory Visit (INDEPENDENT_AMBULATORY_CARE_PROVIDER_SITE_OTHER): Payer: PRIVATE HEALTH INSURANCE | Admitting: Gastroenterology

## 2019-11-21 ENCOUNTER — Encounter: Payer: Self-pay | Admitting: Gastroenterology

## 2019-11-21 VITALS — BP 123/72 | HR 78 | Temp 96.9°F | Ht 66.0 in | Wt 256.0 lb

## 2019-11-21 DIAGNOSIS — K760 Fatty (change of) liver, not elsewhere classified: Secondary | ICD-10-CM | POA: Diagnosis not present

## 2019-11-21 DIAGNOSIS — R112 Nausea with vomiting, unspecified: Secondary | ICD-10-CM

## 2019-11-21 DIAGNOSIS — K2100 Gastro-esophageal reflux disease with esophagitis, without bleeding: Secondary | ICD-10-CM | POA: Diagnosis not present

## 2019-11-21 MED ORDER — PROMETHAZINE HCL 25 MG PO TABS: 25.0000 mg | ORAL_TABLET | ORAL | 0 refills | Status: DC | PRN

## 2019-11-21 NOTE — Assessment & Plan Note (Addendum)
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  Symptoms infrequent.  Abnormal gastric emptying study especially at hours 3 and 4.  Encouraged 5-6 small low-fat meals per day.  Tight glycemic control.  Supportive measures with Zofran and/or Phenergan.  Return to the office in August.  Some vague symptoms of upper abdominal discomfort.  Encouraged her to keep a diary and let me know if she notices any aggravating or alleviating factors.  Thus far has had a pretty work-up.  We will monitor for now.

## 2019-11-21 NOTE — Patient Instructions (Addendum)
1. Continue pantoprazole 40mg  daily before breakfast.  2. I will send in phenergan to have on hand for episodes of nausea/vomiting. You can take one every 4-6 hours but it can cause sedation/drowsiness. You can also take along with zofran if needed but again separate your zofran by 4-6 hours between doses.  3. Continue to try to eat 5-6 small meals daily, low-fat foods to help with stomach emptying.  4. Follow up with your PCP for diabetes follow up. Tight control of your sugars will help with your delayed stomach emptying as well as fatty liver.  5. It is important that you try be active as much as possible, strive towards weight loss (15-20 pounds) in the next 4-6 months. This will help your liver health.  6. Return to the office in 04/2020 and we will get you scheduled for colonoscopy at that time.    Gastroparesis  Gastroparesis is a condition in which food takes longer than normal to empty from the stomach. The condition is usually long-lasting (chronic). It may also be called delayed gastric emptying. There is no cure, but there are treatments and things that you can do at home to help relieve symptoms. Treating the underlying condition that causes gastroparesis can also help relieve symptoms. What are the causes? In many cases, the cause of this condition is not known. Possible causes include:  A hormone (endocrine) disorder, such as hypothyroidism or diabetes.  A nervous system disease, such as Parkinson's disease or multiple sclerosis.  Cancer, infection, or surgery that affects the stomach or vagus nerve. The vagus nerve runs from your chest, through your neck, to the lower part of your brain.  A connective tissue disorder, such as scleroderma.  Certain medicines. What increases the risk? You are more likely to develop this condition if you:  Have certain disorders or diseases, including: ? An endocrine disorder. ? An eating  disorder. ? Amyloidosis. ? Scleroderma. ? Parkinson's disease. ? Multiple sclerosis. ? Cancer or infection of the stomach or the vagus nerve.  Have had surgery on the stomach or vagus nerve.  Take certain medicines.  Are female. What are the signs or symptoms? Symptoms of this condition include:  Feeling full after eating very little.  Nausea.  Vomiting.  Heartburn.  Abdominal bloating.  Inconsistent blood sugar (glucose) levels on blood tests.  Lack of appetite.  Weight loss.  Acid from the stomach coming up into the esophagus (gastroesophageal reflux).  Sudden tightening (spasm) of the stomach, which can be painful. Symptoms may come and go. Some people may not notice any symptoms. How is this diagnosed? This condition is diagnosed with tests, such as:  Tests that check how long it takes food to move through the stomach and intestines. These tests include: ? Upper gastrointestinal (GI) series. For this test, you drink a liquid that shows up well on X-rays, and then X-rays will be taken of your intestines. ? Gastric emptying scintigraphy. For this test, you eat food that contains a small amount of radioactive material, and then scans are taken. ? Wireless capsule GI monitoring system. For this test, you swallow a pill (capsule) that records information about how foods and fluid move through your stomach.  Gastric manometry. For this test, a tube is passed down your throat and into your stomach to measure electrical and muscular activity.  Endoscopy. For this test, a long, thin tube is passed down your throat and into your stomach to check for problems in your stomach lining.  Ultrasound.  This test uses sound waves to create images of inside the body. This can help rule out gallbladder disease or pancreatitis as a cause of your symptoms. How is this treated? There is no cure for gastroparesis. Treatment may include:  Treating the underlying cause.  Managing your  symptoms by making changes to your diet and exercise habits.  Taking medicines to control nausea and vomiting and to stimulate stomach muscles.  Getting food through a feeding tube in the hospital. This may be done in severe cases.  Having surgery to insert a device into your body that helps improve stomach emptying and control nausea and vomiting (gastric neurostimulator). Follow these instructions at home:  Take over-the-counter and prescription medicines only as told by your health care provider.  Follow instructions from your health care provider about eating or drinking restrictions. Your health care provider may recommend that you: ? Eat smaller meals more often. ? Eat low-fat foods. ? Eat low-fiber forms of high-fiber foods. For example, eat cooked vegetables instead of raw vegetables. ? Have only liquid foods instead of solid foods. Liquid foods are easier to digest.  Drink enough fluid to keep your urine pale yellow.  Exercise as often as told by your health care provider.  Keep all follow-up visits as told by your health care provider. This is important. Contact a health care provider if you:  Notice that your symptoms do not improve with treatment.  Have new symptoms. Get help right away if you:  Have severe abdominal pain that does not improve with treatment.  Have nausea that is severe or does not go away.  Cannot drink fluids without vomiting. Summary  Gastroparesis is a chronic condition in which food takes longer than normal to empty from the stomach.  Symptoms include nausea, vomiting, heartburn, abdominal bloating, and loss of appetite.  Eating smaller portions, and low-fat, low-fiber foods may help you manage your symptoms.  Get help right away if you have severe abdominal pain. This information is not intended to replace advice given to you by your health care provider. Make sure you discuss any questions you have with your health care  provider. Document Revised: 12/07/2017 Document Reviewed: 07/14/2017 Elsevier Patient Education  2020 [place].    Fatty Liver Disease  Fatty liver disease occurs when too much fat has built up in your liver cells. Fatty liver disease is also called hepatic steatosis or steatohepatitis. The liver removes harmful substances from your bloodstream and produces fluids that your body needs. It also helps your body use and store energy from the food you eat. In many cases, fatty liver disease does not cause symptoms or problems. It is often diagnosed when tests are being done for other reasons. However, over time, fatty liver can cause inflammation that may lead to more serious liver problems, such as scarring of the liver (cirrhosis) and liver failure. Fatty liver is associated with insulin resistance, increased body fat, high blood pressure (hypertension), and high cholesterol. These are features of metabolic syndrome and increase your risk for stroke, diabetes, and heart disease. What are the causes? This condition may be caused by:  Drinking too much alcohol.  Poor nutrition.  Obesity.  Cushing's syndrome.  Diabetes.  High cholesterol.  Certain drugs.  Poisons.  Some viral infections.  Pregnancy. What increases the risk? You are more likely to develop this condition if you:  Abuse alcohol.  Are overweight.  Have diabetes.  Have hepatitis.  Have a high triglyceride level.  Are pregnant.  What are the signs or symptoms? Fatty liver disease often does not cause symptoms. If symptoms do develop, they can include:  Fatigue.  Weakness.  Weight loss.  Confusion.  Abdominal pain.  Nausea and vomiting.  Yellowing of your skin and the white parts of your eyes (jaundice).  Itchy skin. How is this diagnosed? This condition may be diagnosed by:  A physical exam and medical history.  Blood tests.  Imaging tests, such as an ultrasound, CT scan, or MRI.  A  liver biopsy. A small sample of liver tissue is removed using a needle. The sample is then looked at under a microscope. How is this treated? Fatty liver disease is often caused by other health conditions. Treatment for fatty liver may involve medicines and lifestyle changes to manage conditions such as:  Alcoholism.  High cholesterol.  Diabetes.  Being overweight or obese. Follow these instructions at home:   Do not drink alcohol. If you have trouble quitting, ask your health care provider how to safely quit with the help of medicine or a supervised program. This is important to keep your condition from getting worse.  Eat a healthy diet as told by your health care provider. Ask your health care provider about working with a diet and nutrition specialist (dietitian) to develop an eating plan.  Exercise regularly. This can help you lose weight and control your cholesterol and diabetes. Talk to your health care provider about an exercise plan and which activities are best for you.  Take over-the-counter and prescription medicines only as told by your health care provider.  Keep all follow-up visits as told by your health care provider. This is important. Contact a health care provider if: You have trouble controlling your:  Blood sugar. This is especially important if you have diabetes.  Cholesterol.  Drinking of alcohol. Get help right away if:  You have abdominal pain.  You have jaundice.  You have nausea and vomiting.  You vomit blood or material that looks like coffee grounds.  You have stools that are black, tar-like, or bloody. Summary  Fatty liver disease develops when too much fat builds up in the cells of your liver.  Fatty liver disease often causes no symptoms or problems. However, over time, fatty liver can cause inflammation that may lead to more serious liver problems, such as scarring of the liver (cirrhosis).  You are more likely to develop this  condition if you abuse alcohol, are pregnant, are overweight, have diabetes, have hepatitis, or have high triglyceride levels.  Contact your health care provider if you have trouble controlling your weight, blood sugar, cholesterol, or drinking of alcohol. This information is not intended to replace advice given to you by your health care provider. Make sure you discuss any questions you have with your health care provider. Document Revised: 08/21/2017 Document Reviewed: 06/17/2017 Elsevier Patient Education  2020 [employer].

## 2019-11-21 NOTE — Assessment & Plan Note (Signed)
Encouraged her to follow-up with PCP for A1c/diabetes follow-up.  Important to tightly control her diabetes.  Encouraged 15 to 20 pound weight loss over the next 4 to 6 months.  Encouraged physical activity as tolerated.  We will have her come back in August, recheck labs at that time.  Would encourage her to have LFTs checked at least twice per year.

## 2019-11-21 NOTE — Assessment & Plan Note (Signed)
Typical symptoms well controlled on pantoprazole.  Sometimes has increased symptoms during episode of vomiting, she can take an additional pantoprazole or use something quick acting like Pepcid, Tums.

## 2020-02-21 ENCOUNTER — Other Ambulatory Visit: Payer: Self-pay

## 2020-02-21 ENCOUNTER — Encounter: Payer: Self-pay | Admitting: General Surgery

## 2020-02-21 ENCOUNTER — Ambulatory Visit (INDEPENDENT_AMBULATORY_CARE_PROVIDER_SITE_OTHER): Payer: PRIVATE HEALTH INSURANCE | Admitting: General Surgery

## 2020-02-21 VITALS — BP 132/82 | HR 83 | Temp 98.3°F | Resp 12 | Ht 66.0 in | Wt 257.0 lb

## 2020-02-21 DIAGNOSIS — D1723 Benign lipomatous neoplasm of skin and subcutaneous tissue of right leg: Secondary | ICD-10-CM | POA: Diagnosis not present

## 2020-02-21 NOTE — Patient Instructions (Signed)
Lipoma  A lipoma is a noncancerous (benign) tumor that is made up of fat cells. This is a very common type of soft-tissue growth. Lipomas are usually found under the skin (subcutaneous). They may occur in any tissue of the body that contains fat. Common areas for lipomas to appear include the back, arms, shoulders, buttocks, and thighs. Lipomas grow slowly, and they are usually painless. Most lipomas do not cause problems and do not require treatment. What are the causes? The cause of this condition is not known. What increases the risk? You are more likely to develop this condition if:  You are 40-60 years old.  You have a family history of lipomas. What are the signs or symptoms? A lipoma usually appears as a small, round bump under the skin. In most cases, the lump will:  Feel soft or rubbery.  Not cause pain or other symptoms. However, if a lipoma is located in an area where it pushes on nerves, it can become painful or cause other symptoms. How is this diagnosed? A lipoma can usually be diagnosed with a physical exam. You may also have tests to confirm the diagnosis and to rule out other conditions. Tests may include:  Imaging tests, such as a CT scan or an MRI.  Removal of a tissue sample to be looked at under a microscope (biopsy). How is this treated? Treatment for this condition depends on the size of the lipoma and whether it is causing any symptoms.  For small lipomas that are not causing problems, no treatment is needed.  If a lipoma is bigger or it causes problems, surgery may be done to remove the lipoma. Lipomas can also be removed to improve appearance. Most often, the procedure is done after applying a medicine that numbs the area (local anesthetic).  Liposuction may be done to reduce the size of the lipoma before it is removed through surgery, or it may be done to remove the lipoma. Lipomas are removed with this method in order to limit incision size and scarring. A  liposuction tube is inserted through a small incision into the lipoma, and the contents of the lipoma are removed through the tube with suction. Follow these instructions at home:  Watch your lipoma for any changes.  Keep all follow-up visits as told by your health care provider. This is important. Contact a health care provider if:  Your lipoma becomes larger or hard.  Your lipoma becomes painful, red, or increasingly swollen. These could be signs of infection or a more serious condition. Get help right away if:  You develop tingling or numbness in an area near the lipoma. This could indicate that the lipoma is causing nerve damage. Summary  A lipoma is a noncancerous tumor that is made up of fat cells.  Most lipomas do not cause problems and do not require treatment.  If a lipoma is bigger or it causes problems, surgery may be done to remove the lipoma.  Contact a health care provider if your lipoma becomes larger or hard, or if it becomes painful, red, or increasingly swollen. Pain, redness, and swelling could be signs of infection or a more serious condition. This information is not intended to replace advice given to you by your health care provider. Make sure you discuss any questions you have with your health care provider. Document Revised: 04/25/2019 Document Reviewed: 04/25/2019 Elsevier Patient Education  2020 [employer]  

## 2020-02-22 NOTE — H&P (Signed)
Jeanette Aguirre; [id number]; Sep 19, 1961   HPI Patient is a 59 year old white female who was referred to my care by Dr. Riley Kill for evaluation and treatment of an enlarging subcutaneous mass on her right thigh.  Patient states is been present for several years, but recently has increased in size and is causing her discomfort.  No drainage has been noted.  She currently has 0 out of 10 pain at rest.  She does have discomfort with pressure applied to it. Past Medical History:  Diagnosis Date  . Anxiety   . Chronic kidney disease   . Depression   . Diabetes mellitus without complication ([place])   . GERD (gastroesophageal reflux disease)   . Hyperlipemia   . Pneumonia     Past Surgical History:  Procedure Laterality Date  . CHOLECYSTECTOMY    . COLONOSCOPY N/A 09/04/2017   Dr. Gala Romney: multiple tubular adenomas removed, diverticulosis. next TCS in 3 years.   . ESOPHAGOGASTRODUODENOSCOPY N/A 04/09/2018   Dr. Gala Romney: inflammation of esophagus c/w acid reflux.   . TONSILLECTOMY    . TUBAL LIGATION    . TYMPANOSTOMY TUBE PLACEMENT      Family History  Problem Relation Age of Onset  . Diabetes Mother   . Peripheral vascular disease Mother   . Diabetes Father   . Colitis Daughter 66       lymphocytic colitis  . Colon cancer Neg Hx     Current Outpatient Medications on File Prior to Visit  Medication Sig Dispense Refill  . atorvastatin (LIPITOR) 20 MG tablet Take 20 mg by mouth at bedtime.     Marland Kitchen FLUoxetine (PROZAC) 40 MG capsule Take 40 mg by mouth daily.    Marland Kitchen glimepiride (AMARYL) 4 MG tablet Take 4 mg by mouth 2 (two) times daily.     . ondansetron (ZOFRAN ODT) 4 MG disintegrating tablet Take 1 tablet (4 mg total) by mouth every 8 (eight) hours as needed for nausea or vomiting. 30 tablet 3  . pantoprazole (PROTONIX) 40 MG tablet TAKE 1 TABLET BY MOUTH ONCE DAILY 30 MINUTES BEFORE BREAKFAST 90 tablet 3  . sitaGLIPtin-metformin (JANUMET) 50-1000 MG tablet Take 1 tablet by mouth 2 (two) times daily  with a meal.     No current facility-administered medications on file prior to visit.    Allergies  Allergen Reactions  . Betadine [Povidone Iodine] Swelling  . Ciprofloxacin Nausea Only  . Lisinopril Swelling    Lip swelling/tingling/itching    Social History   Substance and Sexual Activity  Alcohol Use Not Currently   Comment: very little    Social History   Tobacco Use  Smoking Status Former Smoker  . Quit date: 09/22/2016  . Years since quitting: 3.4  Smokeless Tobacco Never Used    Review of Systems  Constitutional: Negative.   HENT: Positive for ear pain, sinus pain and sore throat.   Eyes: Negative.   Respiratory: Positive for shortness of breath.   Cardiovascular: Negative.   Gastrointestinal: Positive for abdominal pain and nausea.  Genitourinary: Negative.   Musculoskeletal: Negative.   Skin: Negative.   Neurological: Negative.   Endo/Heme/Allergies: Negative.   Psychiatric/Behavioral: Negative.     Objective   Vitals:   02/21/20 1524  BP: 132/82  Pulse: 83  Resp: 12  Temp: 98.3 F (36.8 C)  SpO2: 94%    Physical Exam Vitals reviewed.  Constitutional:      Appearance: Normal appearance. She is not ill-appearing.  HENT:     Head: Normocephalic  and atraumatic.  Cardiovascular:     Rate and Rhythm: Normal rate and regular rhythm.     Heart sounds: Normal heart sounds. No murmur. No friction rub. No gallop.   Pulmonary:     Effort: Pulmonary effort is normal. No respiratory distress.     Breath sounds: Normal breath sounds. No stridor. No wheezing or rhonchi.  Musculoskeletal:     Comments: A 5 cm ovoid rubbery subcutaneous mass noted anteriorly on the right thigh.  It does not appear to be fixed.  No erythema or skin changes noted.  Skin:    General: Skin is warm and dry.  Neurological:     Mental Status: She is alert and oriented to person, place, and time.    Primary care notes reviewed Assessment  Lipoma, right thigh,  enlarging Plan   Patient is scheduled for excision of the right thigh lipoma on 03/07/2020.  The risks and benefits of the procedure including bleeding, infection, recurrence of the mass were fully explained to the patient, who gave informed consent.

## 2020-02-22 NOTE — Progress Notes (Signed)
Jeanette Aguirre; [id number]; [birth year]   HPI Jeanette Aguirre is a 59 year old white female who was referred to my care by Dr. Riley Kill for evaluation and treatment of an enlarging subcutaneous mass on her right thigh.  Jeanette Aguirre states is been present for several years, but recently has increased in size and is causing her discomfort.  No drainage has been noted.  She currently has 0 out of 10 pain at rest.  She does have discomfort with pressure applied to it. Past Medical History:  Diagnosis Date  . Anxiety   . Chronic kidney disease   . Depression   . Diabetes mellitus without complication ([place])   . GERD (gastroesophageal reflux disease)   . Hyperlipemia   . Pneumonia     Past Surgical History:  Procedure Laterality Date  . CHOLECYSTECTOMY    . COLONOSCOPY N/A 09/04/2017   Dr. Gala Romney: multiple tubular adenomas removed, diverticulosis. next TCS in 3 years.   . ESOPHAGOGASTRODUODENOSCOPY N/A 04/09/2018   Dr. Gala Romney: inflammation of esophagus c/w acid reflux.   . TONSILLECTOMY    . TUBAL LIGATION    . TYMPANOSTOMY TUBE PLACEMENT      Family History  Problem Relation Age of Onset  . Diabetes Mother   . Peripheral vascular disease Mother   . Diabetes Father   . Colitis Daughter 57       lymphocytic colitis  . Colon cancer Neg Hx     Current Outpatient Medications on File Prior to Visit  Medication Sig Dispense Refill  . atorvastatin (LIPITOR) 20 MG tablet Take 20 mg by mouth at bedtime.     Marland Kitchen FLUoxetine (PROZAC) 40 MG capsule Take 40 mg by mouth daily.    Marland Kitchen glimepiride (AMARYL) 4 MG tablet Take 4 mg by mouth 2 (two) times daily.     . ondansetron (ZOFRAN ODT) 4 MG disintegrating tablet Take 1 tablet (4 mg total) by mouth every 8 (eight) hours as needed for nausea or vomiting. 30 tablet 3  . pantoprazole (PROTONIX) 40 MG tablet TAKE 1 TABLET BY MOUTH ONCE DAILY 30 MINUTES BEFORE BREAKFAST 90 tablet 3  . sitaGLIPtin-metformin (JANUMET) 50-1000 MG tablet Take 1 tablet by mouth 2 (two) times daily  with a meal.     No current facility-administered medications on file prior to visit.    Allergies  Allergen Reactions  . Betadine [Povidone Iodine] Swelling  . Ciprofloxacin Nausea Only  . Lisinopril Swelling    Lip swelling/tingling/itching    Social History   Substance and Sexual Activity  Alcohol Use Not Currently   Comment: very little    Social History   Tobacco Use  Smoking Status Former Smoker  . Quit date: 09/22/2016  . Years since quitting: 3.4  Smokeless Tobacco Never Used    Review of Systems  Constitutional: Negative.   HENT: Positive for ear pain, sinus pain and sore throat.   Eyes: Negative.   Respiratory: Positive for shortness of breath.   Cardiovascular: Negative.   Gastrointestinal: Positive for abdominal pain and nausea.  Genitourinary: Negative.   Musculoskeletal: Negative.   Skin: Negative.   Neurological: Negative.   Endo/Heme/Allergies: Negative.   Psychiatric/Behavioral: Negative.     Objective   Vitals:   02/21/20 1524  BP: 132/82  Pulse: 83  Resp: 12  Temp: 98.3 F (36.8 C)  SpO2: 94%    Physical Exam Vitals reviewed.  Constitutional:      Appearance: Normal appearance. She is not ill-appearing.  HENT:     Head: Normocephalic  and atraumatic.  Cardiovascular:     Rate and Rhythm: Normal rate and regular rhythm.     Heart sounds: Normal heart sounds. No murmur. No friction rub. No gallop.   Pulmonary:     Effort: Pulmonary effort is normal. No respiratory distress.     Breath sounds: Normal breath sounds. No stridor. No wheezing or rhonchi.  Musculoskeletal:     Comments: A 5 cm ovoid rubbery subcutaneous mass noted anteriorly on the right thigh.  It does not appear to be fixed.  No erythema or skin changes noted.  Skin:    General: Skin is warm and dry.  Neurological:     Mental Status: She is alert and oriented to person, place, and time.    Primary care notes reviewed Assessment  Lipoma, right thigh,  enlarging Plan   Jeanette Aguirre is scheduled for excision of the right thigh lipoma on 03/07/2020.  The risks and benefits of the procedure including bleeding, infection, recurrence of the mass were fully explained to the Jeanette Aguirre, who gave informed consent.

## 2020-03-02 NOTE — Patient Instructions (Addendum)
Jeanette Aguirre  03/02/2020     @PREFPERIOPPHARMACY @   Your procedure is scheduled on  03/07/2020.  Report to [facility] at  0700  A.M.              Call this number if you have problems the morning of surgery:  [phone]   Remember:  Do not eat or drink after midnight.                         Take these medicines the morning of surgery with A SIP OF WATER  Prozac, claritin, protonix. DO NOT take any medications for diabetes the morning of your procedure.    Do not wear jewelry, make-up or nail polish.  Do not wear lotions, powders, or perfumes. Please wear deodorant and brush your teeth.  Do not shave 48 hours prior to surgery.  Men may shave face and neck.  Do not bring valuables to the hospital.  [facility] is not responsible for any belongings or valuables.  Contacts, dentures or bridgework may not be worn into surgery.  Leave your suitcase in the car.  After surgery it may be brought to your room.  For patients admitted to the hospital, discharge time will be determined by your treatment team.  Patients discharged the day of surgery will not be allowed to drive home.   Name and phone number of your driver:   family Special instructions:  DO NOT smoke the morning of your procedure.  Please read over the following fact sheets that you were given. Anesthesia Post-op Instructions and Care and Recovery After Surgery       Lipoma Removal, Care After This sheet gives you information about how to care for yourself after your procedure. Your health care provider may also give you more specific instructions. If you have problems or questions, contact your health care provider. What can I expect after the procedure? After the procedure, it is common to have:  Mild pain.  Swelling.  Bruising. Follow these instructions at home: Bathing   Do not take baths, swim, or use a hot tub until your health care provider approves. Ask your health care provider if you  may take showers. You may only be allowed to take sponge baths.  Keep your bandage (dressing) dry until your health care provider says it can be removed. Incision care   Follow instructions from your health care provider about how to take care of your incision. Make sure you: ? Wash your hands with soap and water for at least 20 seconds before and after you change your dressing. If soap and water are not available, use hand sanitizer. ? Change your dressing as told by your health care provider. ? Leave stitches (sutures), skin glue, or adhesive strips in place. These skin closures may need to stay in place for 2 weeks or longer. If adhesive strip edges start to loosen and curl up, you may trim the loose edges. Do not remove adhesive strips completely unless your health care provider tells you to do that.  Check your incision area every day for signs of infection. Check for: ? More redness, swelling, or pain. ? Fluid or blood. ? Warmth. ? Pus or a bad smell. Medicines  Take over-the-counter and prescription medicines only as told by your health care provider.  If you were prescribed an antibiotic medicine, use it as told by your health care provider.  Do not stop using the antibiotic even if you start to feel better. General instructions   If you were given a sedative during the procedure, it can affect you for several hours. Do not drive or operate machinery until your health care provider says that it is safe.  Do not use any products that contain nicotine or tobacco, such as cigarettes, e-cigarettes, and chewing tobacco. These can delay healing. If you need help quitting, ask your health care provider.  Return to your normal activities as told by your health care provider. Ask your health care provider what activities are safe for you.  Keep all follow-up visits as told by your health care provider. This is important. Contact a health care provider if:  You have more redness,  swelling, or pain around your incision.  You have fluid or blood coming from your incision.  Your incision feels warm to the touch.  You have pus or a bad smell coming from your incision.  You have pain that does not get better with medicine. Get help right away if:  You have chills or a fever.  You have severe pain. Summary  After the procedure, it is common to have mild pain, swelling, and bruising.  Follow instructions from your health care provider about how to take care of your incision.  Check your incision area every day for signs of infection.  Contact a health care provider if you have more redness, swelling, or pain around your incision. This information is not intended to replace advice given to you by your health care provider. Make sure you discuss any questions you have with your health care provider. Document Revised: 04/25/2019 Document Reviewed: 04/25/2019 Elsevier Patient Education  Experiment After These instructions provide you with information about caring for yourself after your procedure. Your health care provider may also give you more specific instructions. Your treatment has been planned according to current medical practices, but problems sometimes occur. Call your health care provider if you have any problems or questions after your procedure. What can I expect after the procedure? After your procedure, you may:  Feel sleepy for several hours.  Feel clumsy and have poor balance for several hours.  Feel forgetful about what happened after the procedure.  Have poor judgment for several hours.  Feel nauseous or vomit.  Have a sore throat if you had a breathing tube during the procedure. Follow these instructions at home: For at least 24 hours after the procedure:      Have a responsible adult stay with you. It is important to have someone help care for you until you are awake and alert.  Rest as  needed.  Do not: ? Participate in activities in which you could fall or become injured. ? Drive. ? Use heavy machinery. ? Drink alcohol. ? Take sleeping pills or medicines that cause drowsiness. ? Make important decisions or sign legal documents. ? Take care of children on your own. Eating and drinking  Follow the diet that is recommended by your health care provider.  If you vomit, drink water, juice, or soup when you can drink without vomiting.  Make sure you have little or no nausea before eating solid foods. General instructions  Take over-the-counter and prescription medicines only as told by your health care provider.  If you have sleep apnea, surgery and certain medicines can increase your risk for breathing problems. Follow instructions from your health care provider about wearing your  sleep device: ? Anytime you are sleeping, including during daytime naps. ? While taking prescription pain medicines, sleeping medicines, or medicines that make you drowsy.  If you smoke, do not smoke without supervision.  Keep all follow-up visits as told by your health care provider. This is important. Contact a health care provider if:  You keep feeling nauseous or you keep vomiting.  You feel light-headed.  You develop a rash.  You have a fever. Get help right away if:  You have trouble breathing. Summary  For several hours after your procedure, you may feel sleepy and have poor judgment.  Have a responsible adult stay with you for at least 24 hours or until you are awake and alert. This information is not intended to replace advice given to you by your health care provider. Make sure you discuss any questions you have with your health care provider. Document Revised: 12/07/2017 Document Reviewed: 12/30/2015 Elsevier Patient Education  [place]. How to Use Chlorhexidine for Bathing Chlorhexidine gluconate (CHG) is a germ-killing (antiseptic) solution that is used to  clean the skin. It can get rid of the bacteria that normally live on the skin and can keep them away for about 24 hours. To clean your skin with CHG, you may be given:  A CHG solution to use in the shower or as part of a sponge bath.  A prepackaged cloth that contains CHG. Cleaning your skin with CHG may help lower the risk for infection:  While you are staying in the intensive care unit of the hospital.  If you have a vascular access, such as a central line, to provide short-term or long-term access to your veins.  If you have a catheter to drain urine from your bladder.  If you are on a ventilator. A ventilator is a machine that helps you breathe by moving air in and out of your lungs.  After surgery. What are the risks? Risks of using CHG include:  A skin reaction.  Hearing loss, if CHG gets in your ears.  Eye injury, if CHG gets in your eyes and is not rinsed out.  The CHG product catching fire. Make sure that you avoid smoking and flames after applying CHG to your skin. Do not use CHG:  If you have a chlorhexidine allergy or have previously reacted to chlorhexidine.  On babies younger than 27 months of age. How to use CHG solution  Use CHG only as told by your health care provider, and follow the instructions on the label.  Use the full amount of CHG as directed. Usually, this is one bottle. During a shower Follow these steps when using CHG solution during a shower (unless your health care provider gives you different instructions): 1. Start the shower. 2. Use your normal soap and shampoo to wash your face and hair. 3. Turn off the shower or move out of the shower stream. 4. Pour the CHG onto a clean washcloth. Do not use any type of brush or rough-edged sponge. 5. Starting at your neck, lather your body down to your toes. Make sure you follow these instructions: ? If you will be having surgery, pay special attention to the part of your body where you will be having  surgery. Scrub this area for at least 1 minute. ? Do not use CHG on your head or face. If the solution gets into your ears or eyes, rinse them well with water. ? Avoid your genital area. ? Avoid any areas of  skin that have broken skin, cuts, or scrapes. ? Scrub your back and under your arms. Make sure to wash skin folds. 6. Let the lather sit on your skin for 1-2 minutes or as long as told by your health care provider. 7. Thoroughly rinse your entire body in the shower. Make sure that all body creases and crevices are rinsed well. 8. Dry off with a clean towel. Do not put any substances on your body afterward--such as powder, lotion, or perfume--unless you are told to do so by your health care provider. Only use lotions that are recommended by the manufacturer. 9. Put on clean clothes or pajamas. 10. If it is the night before your surgery, sleep in clean sheets.  During a sponge bath Follow these steps when using CHG solution during a sponge bath (unless your health care provider gives you different instructions): 1. Use your normal soap and shampoo to wash your face and hair. 2. Pour the CHG onto a clean washcloth. 3. Starting at your neck, lather your body down to your toes. Make sure you follow these instructions: ? If you will be having surgery, pay special attention to the part of your body where you will be having surgery. Scrub this area for at least 1 minute. ? Do not use CHG on your head or face. If the solution gets into your ears or eyes, rinse them well with water. ? Avoid your genital area. ? Avoid any areas of skin that have broken skin, cuts, or scrapes. ? Scrub your back and under your arms. Make sure to wash skin folds. 4. Let the lather sit on your skin for 1-2 minutes or as long as told by your health care provider. 5. Using a different clean, wet washcloth, thoroughly rinse your entire body. Make sure that all body creases and crevices are rinsed well. 6. Dry off with a  clean towel. Do not put any substances on your body afterward--such as powder, lotion, or perfume--unless you are told to do so by your health care provider. Only use lotions that are recommended by the manufacturer. 7. Put on clean clothes or pajamas. 8. If it is the night before your surgery, sleep in clean sheets. How to use CHG prepackaged cloths  Only use CHG cloths as told by your health care provider, and follow the instructions on the label.  Use the CHG cloth on clean, dry skin.  Do not use the CHG cloth on your head or face unless your health care provider tells you to.  When washing with the CHG cloth: ? Avoid your genital area. ? Avoid any areas of skin that have broken skin, cuts, or scrapes. Before surgery Follow these steps when using a CHG cloth to clean before surgery (unless your health care provider gives you different instructions): 1. Using the CHG cloth, vigorously scrub the part of your body where you will be having surgery. Scrub using a back-and-forth motion for 3 minutes. The area on your body should be completely wet with CHG when you are done scrubbing. 2. Do not rinse. Discard the cloth and let the area air-dry. Do not put any substances on the area afterward, such as powder, lotion, or perfume. 3. Put on clean clothes or pajamas. 4. If it is the night before your surgery, sleep in clean sheets.  For general bathing Follow these steps when using CHG cloths for general bathing (unless your health care provider gives you different instructions). 1. Use a separate CHG cloth  for each area of your body. Make sure you wash between any folds of skin and between your fingers and toes. Wash your body in the following order, switching to a new cloth after each step: ? The front of your neck, shoulders, and chest. ? Both of your arms, under your arms, and your hands. ? Your stomach and groin area, avoiding the genitals. ? Your right leg and foot. ? Your left leg and  foot. ? The back of your neck, your back, and your buttocks. 2. Do not rinse. Discard the cloth and let the area air-dry. Do not put any substances on your body afterward--such as powder, lotion, or perfume--unless you are told to do so by your health care provider. Only use lotions that are recommended by the manufacturer. 3. Put on clean clothes or pajamas. Contact a health care provider if:  Your skin gets irritated after scrubbing.  You have questions about using your solution or cloth. Get help right away if:  Your eyes become very red or swollen.  Your eyes itch badly.  Your skin itches badly and is red or swollen.  Your hearing changes.  You have trouble seeing.  You have swelling or tingling in your mouth or throat.  You have trouble breathing.  You swallow any chlorhexidine. Summary  Chlorhexidine gluconate (CHG) is a germ-killing (antiseptic) solution that is used to clean the skin. Cleaning your skin with CHG may help to lower your risk for infection.  You may be given CHG to use for bathing. It may be in a bottle or in a prepackaged cloth to use on your skin. Carefully follow your health care provider's instructions and the instructions on the product label.  Do not use CHG if you have a chlorhexidine allergy.  Contact your health care provider if your skin gets irritated after scrubbing. This information is not intended to replace advice given to you by your health care provider. Make sure you discuss any questions you have with your health care provider. Document Revised: 11/25/2018 Document Reviewed: 08/06/2017 Elsevier Patient Education  [place]. Hg

## 2020-03-05 ENCOUNTER — Encounter (HOSPITAL_COMMUNITY): Payer: Self-pay

## 2020-03-05 ENCOUNTER — Encounter (HOSPITAL_COMMUNITY)
Admission: RE | Admit: 2020-03-05 | Discharge: 2020-03-05 | Disposition: A | Discharge status: Home / Self Care | Payer: PRIVATE HEALTH INSURANCE | Source: Ambulatory Visit | Attending: General Surgery | Admitting: General Surgery

## 2020-03-05 ENCOUNTER — Other Ambulatory Visit: Payer: Self-pay

## 2020-03-05 ENCOUNTER — Other Ambulatory Visit (HOSPITAL_COMMUNITY)
Admission: RE | Admit: 2020-03-05 | Discharge: 2020-03-05 | Disposition: A | Discharge status: Home / Self Care | Payer: PRIVATE HEALTH INSURANCE | Source: Ambulatory Visit | Attending: General Surgery | Admitting: General Surgery

## 2020-03-05 DIAGNOSIS — Z20822 Contact with and (suspected) exposure to covid-19: Secondary | ICD-10-CM | POA: Diagnosis not present

## 2020-03-05 DIAGNOSIS — Z01812 Encounter for preprocedural laboratory examination: Secondary | ICD-10-CM | POA: Diagnosis not present

## 2020-03-05 HISTORY — DX: Other specified postprocedural states: Z98.890

## 2020-03-05 HISTORY — DX: Family history of other specified conditions: Z84.89

## 2020-03-05 HISTORY — DX: Gastroparesis: K31.84

## 2020-03-05 HISTORY — DX: Other specified postprocedural states: R11.2

## 2020-03-05 LAB — BASIC METABOLIC PANEL
Anion gap: 12 (ref 5–15)
BUN: 11 mg/dL (ref 6–20)
CO2: 25 mmol/L (ref 22–32)
Calcium: 8.8 mg/dL — ABNORMAL LOW (ref 8.9–10.3)
Chloride: 100 mmol/L (ref 98–111)
Creatinine, Ser: 0.73 mg/dL (ref 0.44–1.00)
GFR calc Af Amer: >60 mL/min (ref >60)
GFR calc non Af Amer: >60 mL/min (ref >60)
Glucose, Bld: 107 mg/dL — ABNORMAL HIGH (ref 70–99)
Potassium: 3.2 mmol/L — ABNORMAL LOW (ref 3.5–5.1)
Sodium: 137 mmol/L (ref 135–145)

## 2020-03-05 LAB — HEMOGLOBIN A1C
Hgb A1c MFr Bld: 7.9 % — ABNORMAL HIGH (ref 4.8–5.6)
Mean Plasma Glucose: 180.03 mg/dL

## 2020-03-06 LAB — SARS CORONAVIRUS 2 (TAT 6-24 HRS): SARS Coronavirus 2: NEGATIVE

## 2020-03-06 NOTE — Pre-Procedure Instructions (Signed)
Dr Arnoldo Morale and Georgian Co, OR supervisor aware of topical betadine allergy.

## 2020-03-06 NOTE — Pre-Procedure Instructions (Signed)
HgbA1c routed to PCP. 

## 2020-03-07 ENCOUNTER — Other Ambulatory Visit: Payer: Self-pay

## 2020-03-07 ENCOUNTER — Encounter (HOSPITAL_COMMUNITY)
Admission: RE | Disposition: A | Discharge status: Home / Self Care | Payer: Self-pay | Source: Home / Self Care | Attending: General Surgery

## 2020-03-07 ENCOUNTER — Ambulatory Visit (HOSPITAL_COMMUNITY): Payer: PRIVATE HEALTH INSURANCE | Admitting: Anesthesiology

## 2020-03-07 ENCOUNTER — Ambulatory Visit (HOSPITAL_COMMUNITY)
Admission: RE | Admit: 2020-03-07 | Discharge: 2020-03-07 | Disposition: A | Discharge status: Home / Self Care | Payer: PRIVATE HEALTH INSURANCE | Attending: General Surgery | Admitting: General Surgery

## 2020-03-07 ENCOUNTER — Encounter (HOSPITAL_COMMUNITY): Payer: Self-pay | Admitting: General Surgery

## 2020-03-07 DIAGNOSIS — R2241 Localized swelling, mass and lump, right lower limb: Secondary | ICD-10-CM | POA: Diagnosis present

## 2020-03-07 DIAGNOSIS — E1122 Type 2 diabetes mellitus with diabetic chronic kidney disease: Secondary | ICD-10-CM | POA: Diagnosis not present

## 2020-03-07 DIAGNOSIS — K219 Gastro-esophageal reflux disease without esophagitis: Secondary | ICD-10-CM | POA: Insufficient documentation

## 2020-03-07 DIAGNOSIS — N189 Chronic kidney disease, unspecified: Secondary | ICD-10-CM | POA: Diagnosis not present

## 2020-03-07 DIAGNOSIS — Z87891 Personal history of nicotine dependence: Secondary | ICD-10-CM | POA: Diagnosis not present

## 2020-03-07 DIAGNOSIS — D1723 Benign lipomatous neoplasm of skin and subcutaneous tissue of right leg: Secondary | ICD-10-CM

## 2020-03-07 DIAGNOSIS — Z7984 Long term (current) use of oral hypoglycemic drugs: Secondary | ICD-10-CM | POA: Insufficient documentation

## 2020-03-07 DIAGNOSIS — Z79899 Other long term (current) drug therapy: Secondary | ICD-10-CM | POA: Insufficient documentation

## 2020-03-07 DIAGNOSIS — E785 Hyperlipidemia, unspecified: Secondary | ICD-10-CM | POA: Diagnosis not present

## 2020-03-07 DIAGNOSIS — F419 Anxiety disorder, unspecified: Secondary | ICD-10-CM | POA: Diagnosis not present

## 2020-03-07 HISTORY — PX: LIPOMA EXCISION: SHX5283

## 2020-03-07 LAB — GLUCOSE, CAPILLARY
Glucose-Capillary: 169 mg/dL — ABNORMAL HIGH (ref 70–99)
Glucose-Capillary: 113 mg/dL — ABNORMAL HIGH (ref 70–99)

## 2020-03-07 SURGERY — EXCISION LIPOMA
Anesthesia: General | Laterality: Right

## 2020-03-07 MED ORDER — ONDANSETRON HCL 4 MG/2ML IJ SOLN
INTRAMUSCULAR | Status: AC
  Filled 2020-03-07: qty 2

## 2020-03-07 MED ORDER — ONDANSETRON HCL 4 MG/2ML IJ SOLN
INTRAMUSCULAR | Status: DC | PRN
  Administered 2020-03-07: 4 mg via INTRAVENOUS

## 2020-03-07 MED ORDER — ORAL CARE MOUTH RINSE: 15.0000 mL | Freq: Once | OROMUCOSAL | Status: AC

## 2020-03-07 MED ORDER — BUPIVACAINE HCL (PF) 0.5 % IJ SOLN
INTRAMUSCULAR | Status: DC | PRN
  Administered 2020-03-07: 10 mL

## 2020-03-07 MED ORDER — HYDROMORPHONE HCL 1 MG/ML IJ SOLN: 0.2500 mg | INTRAMUSCULAR | Status: DC | PRN

## 2020-03-07 MED ORDER — CHLORHEXIDINE GLUCONATE 0.12 % MT SOLN
15.0000 mL | Freq: Once | OROMUCOSAL | Status: AC
  Administered 2020-03-07: 15 mL via OROMUCOSAL

## 2020-03-07 MED ORDER — PROPOFOL 10 MG/ML IV BOLUS
INTRAVENOUS | Status: AC
  Filled 2020-03-07: qty 20

## 2020-03-07 MED ORDER — PROPOFOL 10 MG/ML IV BOLUS
INTRAVENOUS | Status: DC | PRN
  Administered 2020-03-07: 120 mg via INTRAVENOUS

## 2020-03-07 MED ORDER — METOCLOPRAMIDE HCL 5 MG/ML IJ SOLN
INTRAMUSCULAR | Status: DC | PRN
Start: 2020-03-07 — End: 2020-03-07
  Administered 2020-03-07: 10 mg via INTRAVENOUS

## 2020-03-07 MED ORDER — MIDAZOLAM HCL 2 MG/2ML IJ SOLN
INTRAMUSCULAR | Status: DC | PRN
  Administered 2020-03-07 (×2): 1 mg via INTRAVENOUS

## 2020-03-07 MED ORDER — FENTANYL CITRATE (PF) 100 MCG/2ML IJ SOLN
INTRAMUSCULAR | Status: DC | PRN
  Administered 2020-03-07 (×2): 50 ug via INTRAVENOUS

## 2020-03-07 MED ORDER — PROPOFOL 10 MG/ML IV BOLUS
INTRAVENOUS | Status: AC
  Filled 2020-03-07: qty 40

## 2020-03-07 MED ORDER — POVIDONE-IODINE 10 % EX OINT
TOPICAL_OINTMENT | CUTANEOUS | Status: AC
  Filled 2020-03-07: qty 1

## 2020-03-07 MED ORDER — MIDAZOLAM HCL 2 MG/2ML IJ SOLN
INTRAMUSCULAR | Status: AC
  Filled 2020-03-07: qty 2

## 2020-03-07 MED ORDER — TRAMADOL HCL 50 MG PO TABS: 50.0000 mg | ORAL_TABLET | Freq: Four times a day (QID) | ORAL | 0 refills | Status: DC | PRN

## 2020-03-07 MED ORDER — LACTATED RINGERS IV SOLN: INTRAVENOUS | Status: DC

## 2020-03-07 MED ORDER — LIDOCAINE HCL (CARDIAC) PF 100 MG/5ML IV SOSY
PREFILLED_SYRINGE | INTRAVENOUS | Status: DC | PRN
  Administered 2020-03-07: 100 mg via INTRAVENOUS

## 2020-03-07 MED ORDER — KETOROLAC TROMETHAMINE 30 MG/ML IJ SOLN: 30.0000 mg | Freq: Once | INTRAMUSCULAR | Status: DC

## 2020-03-07 MED ORDER — METOCLOPRAMIDE HCL 5 MG/ML IJ SOLN
INTRAMUSCULAR | Status: AC
  Filled 2020-03-07: qty 2

## 2020-03-07 MED ORDER — BUPIVACAINE HCL (PF) 0.5 % IJ SOLN
INTRAMUSCULAR | Status: AC
  Filled 2020-03-07: qty 30

## 2020-03-07 MED ORDER — CHLORHEXIDINE GLUCONATE 0.12 % MT SOLN
OROMUCOSAL | Status: AC
  Filled 2020-03-07: qty 15

## 2020-03-07 MED ORDER — LIDOCAINE 2% (20 MG/ML) 5 ML SYRINGE
INTRAMUSCULAR | Status: AC
  Filled 2020-03-07: qty 5

## 2020-03-07 MED ORDER — ONDANSETRON HCL 4 MG/2ML IJ SOLN: 4.0000 mg | Freq: Once | INTRAMUSCULAR | Status: DC | PRN

## 2020-03-07 MED ORDER — GLYCOPYRROLATE 0.2 MG/ML IJ SOLN
INTRAMUSCULAR | Status: DC | PRN
Start: 2020-03-07 — End: 2020-03-07
  Administered 2020-03-07: .2 mg via INTRAVENOUS

## 2020-03-07 MED ORDER — FENTANYL CITRATE (PF) 100 MCG/2ML IJ SOLN
INTRAMUSCULAR | Status: AC
  Filled 2020-03-07: qty 2

## 2020-03-07 MED ORDER — CHLORHEXIDINE GLUCONATE CLOTH 2 % EX PADS: 6.0000 | MEDICATED_PAD | Freq: Once | CUTANEOUS | Status: DC

## 2020-03-07 MED ORDER — 0.9 % SODIUM CHLORIDE (POUR BTL) OPTIME
TOPICAL | Status: DC | PRN
  Administered 2020-03-07: 1000 mL

## 2020-03-07 SURGICAL SUPPLY — 29 items
APPLICATOR CHLORAPREP 10.5 ORG (MISCELLANEOUS) ×3 IMPLANT
CHLORAPREP W/TINT 26 (MISCELLANEOUS) ×3 IMPLANT
CLOTH BEACON ORANGE TIMEOUT ST (SAFETY) ×3 IMPLANT
COVER LIGHT HANDLE STERIS (MISCELLANEOUS) ×6 IMPLANT
COVER WAND RF STERILE (DRAPES) ×3 IMPLANT
DECANTER SPIKE VIAL GLASS SM (MISCELLANEOUS) ×3 IMPLANT
DERMABOND ADVANCED (GAUZE/BANDAGES/DRESSINGS) ×4
DERMABOND ADVANCED .7 DNX12 (GAUZE/BANDAGES/DRESSINGS) ×2 IMPLANT
ELECT REM PT RETURN 9FT ADLT (ELECTROSURGICAL) ×3
ELECTRODE REM PT RTRN 9FT ADLT (ELECTROSURGICAL) ×1 IMPLANT
GLOVE BIOGEL M 7.0 STRL (GLOVE) ×3 IMPLANT
GLOVE BIOGEL PI IND STRL 7.0 (GLOVE) ×2 IMPLANT
GLOVE BIOGEL PI IND STRL 7.5 (GLOVE) ×1 IMPLANT
GLOVE BIOGEL PI INDICATOR 7.0 (GLOVE) ×4
GLOVE BIOGEL PI INDICATOR 7.5 (GLOVE) ×2
GLOVE SURG SS PI 7.5 STRL IVOR (GLOVE) ×3 IMPLANT
GOWN STRL REUS W/TWL LRG LVL3 (GOWN DISPOSABLE) ×6 IMPLANT
KIT TURNOVER KIT A (KITS) ×3 IMPLANT
MANIFOLD NEPTUNE II (INSTRUMENTS) ×3 IMPLANT
NEEDLE HYPO 25X1 1.5 SAFETY (NEEDLE) ×3 IMPLANT
NS IRRIG 1000ML POUR BTL (IV SOLUTION) ×3 IMPLANT
PACK MINOR (CUSTOM PROCEDURE TRAY) ×3 IMPLANT
PAD ARMBOARD 7.5X6 YLW CONV (MISCELLANEOUS) ×3 IMPLANT
SET BASIN LINEN APH (SET/KITS/TRAYS/PACK) ×3 IMPLANT
SUT ETHILON 3 0 FSL (SUTURE) IMPLANT
SUT MNCRL AB 4-0 PS2 18 (SUTURE) ×3 IMPLANT
SUT PROLENE 4 0 PS 2 18 (SUTURE) ×3 IMPLANT
SYR CONTROL 10ML LL (SYRINGE) ×3 IMPLANT
TOWEL OR 17X26 4PK STRL BLUE (TOWEL DISPOSABLE) ×3 IMPLANT

## 2020-03-07 NOTE — Op Note (Signed)
Patient:  Jeanette Aguirre  DOB:  1961/07/12  MRN:  [id number]   Preop Diagnosis: Subcutaneous soft tissue mass, right thigh  Postop Diagnosis: Lipoma, right thigh  Procedure: Excision of lipoma, right thigh  Surgeon: Aviva Signs, MD  Anes: General  Indications: Patient is a 59 year old white female who presents with a symptomatic subcutaneous mass of the right thigh.  The risks and benefits of the procedure including bleeding, infection, and the possibility of recurrence of the mass were fully explained to the patient, who gave informed consent.  Procedure note: The patient was placed in the supine position.  After general anesthesia was administered, the right thigh was prepped and draped using usual sterile technique with ChloraPrep.  Surgical site confirmation was performed.  The subcutaneous mass measured approximately 5 cm in its greatest diameter.  An incision longitudinally was made over the mass which was located in the right thigh laterally.  The dissection was taken down to the subcutaneous tissue.  A lipoma was found.  It was above the fascial area.  It was excised in total without difficulty.  It was sent to pathology for further examination.  A bleeding was controlled using Bovie electrocautery.  Subcutaneous layer was reapproximated using 3-0 Vicryl interrupted suture.  0.5% Sensorcaine was instilled into the surrounding wound.  The skin was closed using a 4-0 Monocryl subcuticular suture.  Dermabond was applied.  All tape and needle counts were correct at the end of the procedure.  Patient was awakened and transferred to PACU in stable condition.  Complications: None  EBL: Minimal  Specimen: Lipoma, right thigh

## 2020-03-07 NOTE — Discharge Instructions (Signed)
Lipoma Removal, Care After This sheet gives you information about how to care for yourself after your procedure. Your health care provider may also give you more specific instructions. If you have problems or questions, contact your health care provider. What can I expect after the procedure? After the procedure, it is common to have:  Mild pain.  Swelling.  Bruising. Follow these instructions at home: Bathing   Do not take baths, swim, or use a hot tub until your health care provider approves. Ask your health care provider if you may take showers. You may only be allowed to take sponge baths.  Keep your bandage (dressing) dry until your health care provider says it can be removed. Incision care   Follow instructions from your health care provider about how to take care of your incision. Make sure you: ? Wash your hands with soap and water for at least 20 seconds before and after you change your dressing. If soap and water are not available, use hand sanitizer. ? Change your dressing as told by your health care provider. ? Leave stitches (sutures), skin glue, or adhesive strips in place. These skin closures may need to stay in place for 2 weeks or longer. If adhesive strip edges start to loosen and curl up, you may trim the loose edges. Do not remove adhesive strips completely unless your health care provider tells you to do that.  Check your incision area every day for signs of infection. Check for: ? More redness, swelling, or pain. ? Fluid or blood. ? Warmth. ? Pus or a bad smell. Medicines  Take over-the-counter and prescription medicines only as told by your health care provider.  If you were prescribed an antibiotic medicine, use it as told by your health care provider. Do not stop using the antibiotic even if you start to feel better. General instructions   If you were given a sedative during the procedure, it can affect you for several hours. Do not drive or operate  machinery until your health care provider says that it is safe.  Do not use any products that contain nicotine or tobacco, such as cigarettes, e-cigarettes, and chewing tobacco. These can delay healing. If you need help quitting, ask your health care provider.  Return to your normal activities as told by your health care provider. Ask your health care provider what activities are safe for you.  Keep all follow-up visits as told by your health care provider. This is important. Contact a health care provider if:  You have more redness, swelling, or pain around your incision.  You have fluid or blood coming from your incision.  Your incision feels warm to the touch.  You have pus or a bad smell coming from your incision.  You have pain that does not get better with medicine. Get help right away if:  You have chills or a fever.  You have severe pain. Summary  After the procedure, it is common to have mild pain, swelling, and bruising.  Follow instructions from your health care provider about how to take care of your incision.  Check your incision area every day for signs of infection.  Contact a health care provider if you have more redness, swelling, or pain around your incision. This information is not intended to replace advice given to you by your health care provider. Make sure you discuss any questions you have with your health care provider. Document Revised: 04/25/2019 Document Reviewed: 04/25/2019 Elsevier Patient Education  2020 Elsevier   Inc.  

## 2020-03-07 NOTE — Transfer of Care (Signed)
Immediate Anesthesia Transfer of Care Note  Patient: Jeanette Aguirre  Procedure(s) Performed: EXCISION LIPOMA, THIGH (Right )  Patient Location: PACU  Anesthesia Type:General  Level of Consciousness: awake, alert , oriented and patient cooperative  Airway & Oxygen Therapy: Patient Spontanous Breathing  Post-op Assessment: Report given to RN and Post -op Vital signs reviewed and stable  Post vital signs: Reviewed and stable  Last Vitals:  Vitals Value Taken Time  BP    Temp    Pulse 83 03/07/20 0935  Resp 22 03/07/20 0935  SpO2 90 % 03/07/20 0935  Vitals shown include unvalidated device data.  Last Pain:  Vitals:   03/07/20 0724  TempSrc: Oral  PainSc: 0-No pain         Complications: No complications documented.

## 2020-03-07 NOTE — Anesthesia Postprocedure Evaluation (Signed)
Anesthesia Post Note  Patient: Jeanette Aguirre  Procedure(s) Performed: EXCISION LIPOMA, THIGH (Right )  Patient location during evaluation: PACU Anesthesia Type: General Level of consciousness: awake and alert Pain management: pain level controlled Vital Signs Assessment: post-procedure vital signs reviewed and stable Respiratory status: spontaneous breathing Cardiovascular status: stable Postop Assessment: no apparent nausea or vomiting and adequate PO intake Anesthetic complications: no   No complications documented.   Last Vitals:  Vitals:   03/07/20 0724  BP: (!) 143/71  Pulse: 74  Resp: 18  Temp: 37.1 C  SpO2: 96%    Last Pain:  Vitals:   03/07/20 0724  TempSrc: Oral  PainSc: 0-No pain                 [name]

## 2020-03-07 NOTE — Anesthesia Preprocedure Evaluation (Signed)
Anesthesia Evaluation  Patient identified by MRN, date of birth, ID band Patient awake    Reviewed: Allergy & Precautions, H&P , NPO status , Patient's Chart, lab work & pertinent test results, reviewed documented beta blocker date and time   History of Anesthesia Complications (+) PONV, Family history of anesthesia reaction and history of anesthetic complications  Airway Mallampati: II  TM Distance: >3 FB Neck ROM: full    Dental no notable dental hx.    Pulmonary pneumonia, resolved, former smoker,    Pulmonary exam normal breath sounds clear to auscultation       Cardiovascular Exercise Tolerance: Good negative cardio ROS   Rhythm:regular Rate:Normal     Neuro/Psych negative neurological ROS  negative psych ROS   GI/Hepatic Neg liver ROS, GERD  Medicated,  Endo/Other  negative endocrine ROSdiabetes  Renal/GU negative Renal ROS  negative genitourinary   Musculoskeletal   Abdominal   Peds  Hematology  (+) Blood dyscrasia, anemia ,   Anesthesia Other Findings   Reproductive/Obstetrics negative OB ROS                             Anesthesia Physical Anesthesia Plan  ASA: II  Anesthesia Plan: General   Post-op Pain Management:    Induction:   PONV Risk Score and Plan: 4 or greater and Ondansetron  Airway Management Planned:   Additional Equipment:   Intra-op Plan:   Post-operative Plan:   Informed Consent: I have reviewed the patients History and Physical, chart, labs and discussed the procedure including the risks, benefits and alternatives for the proposed anesthesia with the patient or authorized representative who has indicated his/her understanding and acceptance.     Dental Advisory Given  Plan Discussed with: CRNA  Anesthesia Plan Comments:         Anesthesia Quick Evaluation

## 2020-03-07 NOTE — Anesthesia Procedure Notes (Signed)
Procedure Name: LMA Insertion Date/Time: 03/07/2020 8:57 AM Performed by: Georgeanne Nim, CRNA Pre-anesthesia Checklist: Emergency Drugs available, Patient identified, Suction available, Patient being monitored and Timeout performed Patient Re-evaluated:Patient Re-evaluated prior to induction Oxygen Delivery Method: Circle system utilized Preoxygenation: Pre-oxygenation with 100% oxygen Induction Type: IV induction LMA: LMA inserted LMA Size: 4.0 Number of attempts: 1 Placement Confirmation: positive ETCO2,  CO2 detector and breath sounds checked- equal and bilateral Tube secured with: Tape

## 2020-03-07 NOTE — Interval H&P Note (Signed)
History and Physical Interval Note:  03/07/2020 7:26 AM  Jeanette Aguirre  has presented today for surgery, with the diagnosis of 4.1cm, lipoma, right thigh.  The various methods of treatment have been discussed with the patient and family. After consideration of risks, benefits and other options for treatment, the patient has consented to  Procedure(s): EXCISION LIPOMA, THIGH (Right) as a surgical intervention.  The patient's history has been reviewed, patient examined, no change in status, stable for surgery.  I have reviewed the patient's chart and labs.  Questions were answered to the patient's satisfaction.     Aviva Signs

## 2020-03-08 ENCOUNTER — Encounter (HOSPITAL_COMMUNITY): Payer: Self-pay | Admitting: General Surgery

## 2020-03-08 ENCOUNTER — Encounter: Payer: Self-pay | Admitting: General Surgery

## 2020-03-08 LAB — SURGICAL PATHOLOGY

## 2020-03-09 ENCOUNTER — Encounter: Payer: Self-pay | Admitting: General Surgery

## 2020-03-10 ENCOUNTER — Encounter (HOSPITAL_COMMUNITY): Payer: Self-pay | Admitting: Emergency Medicine

## 2020-03-10 ENCOUNTER — Emergency Department (HOSPITAL_COMMUNITY)
Admission: EM | Admit: 2020-03-10 | Discharge: 2020-03-10 | Disposition: A | Discharge status: Home / Self Care | Payer: PRIVATE HEALTH INSURANCE | Attending: Emergency Medicine | Admitting: Emergency Medicine

## 2020-03-10 ENCOUNTER — Other Ambulatory Visit: Payer: Self-pay

## 2020-03-10 DIAGNOSIS — E119 Type 2 diabetes mellitus without complications: Secondary | ICD-10-CM | POA: Insufficient documentation

## 2020-03-10 DIAGNOSIS — L03115 Cellulitis of right lower limb: Secondary | ICD-10-CM | POA: Diagnosis not present

## 2020-03-10 DIAGNOSIS — Z888 Allergy status to other drugs, medicaments and biological substances status: Secondary | ICD-10-CM | POA: Insufficient documentation

## 2020-03-10 DIAGNOSIS — Z87891 Personal history of nicotine dependence: Secondary | ICD-10-CM | POA: Diagnosis not present

## 2020-03-10 DIAGNOSIS — Z7984 Long term (current) use of oral hypoglycemic drugs: Secondary | ICD-10-CM | POA: Diagnosis not present

## 2020-03-10 DIAGNOSIS — R21 Rash and other nonspecific skin eruption: Secondary | ICD-10-CM | POA: Diagnosis present

## 2020-03-10 LAB — CBC WITH DIFFERENTIAL/PLATELET
Abs Immature Granulocytes: 0.01 10*3/uL (ref 0.00–0.07)
Basophils Absolute: 0 10*3/uL (ref 0.0–0.1)
Basophils Relative: 0 %
Eosinophils Absolute: 0.2 10*3/uL (ref 0.0–0.5)
Eosinophils Relative: 3 %
HCT: 31.5 % — ABNORMAL LOW (ref 36.0–46.0)
Hemoglobin: 10 g/dL — ABNORMAL LOW (ref 12.0–15.0)
Immature Granulocytes: 0 %
Lymphocytes Relative: 30 %
Lymphs Abs: 2 10*3/uL (ref 0.7–4.0)
MCH: 27.3 pg (ref 26.0–34.0)
MCHC: 31.7 g/dL (ref 30.0–36.0)
MCV: 86.1 fL (ref 80.0–100.0)
Monocytes Absolute: 0.5 10*3/uL (ref 0.1–1.0)
Monocytes Relative: 7 %
Neutro Abs: 4 10*3/uL (ref 1.7–7.7)
Neutrophils Relative %: 60 %
Platelets: 253 10*3/uL (ref 150–400)
RBC: 3.66 MIL/uL — ABNORMAL LOW (ref 3.87–5.11)
RDW: 13.4 % (ref 11.5–15.5)
WBC: 6.7 10*3/uL (ref 4.0–10.5)
nRBC: 0 % (ref 0.0–0.2)

## 2020-03-10 LAB — COMPREHENSIVE METABOLIC PANEL
ALT: 42 U/L (ref 0–44)
AST: 41 U/L (ref 15–41)
Albumin: 3.4 g/dL — ABNORMAL LOW (ref 3.5–5.0)
Alkaline Phosphatase: 110 U/L (ref 38–126)
Anion gap: 11 (ref 5–15)
BUN: 8 mg/dL (ref 6–20)
CO2: 27 mmol/L (ref 22–32)
Calcium: 8.4 mg/dL — ABNORMAL LOW (ref 8.9–10.3)
Chloride: 101 mmol/L (ref 98–111)
Creatinine, Ser: 0.69 mg/dL (ref 0.44–1.00)
GFR calc Af Amer: >60 mL/min (ref >60)
GFR calc non Af Amer: >60 mL/min (ref >60)
Glucose, Bld: 220 mg/dL — ABNORMAL HIGH (ref 70–99)
Potassium: 3.4 mmol/L — ABNORMAL LOW (ref 3.5–5.1)
Sodium: 139 mmol/L (ref 135–145)
Total Bilirubin: 0.6 mg/dL (ref 0.3–1.2)
Total Protein: 7 g/dL (ref 6.5–8.1)

## 2020-03-10 MED ORDER — LIDOCAINE HCL (PF) 2 % IJ SOLN
INTRAMUSCULAR | Status: AC
  Filled 2020-03-10: qty 10

## 2020-03-10 MED ORDER — SODIUM CHLORIDE 0.9 % IV SOLN
1.0000 g | Freq: Once | INTRAVENOUS | Status: AC
  Administered 2020-03-10: 1 g via INTRAVENOUS
  Filled 2020-03-10: qty 10

## 2020-03-10 NOTE — ED Provider Notes (Signed)
[facility] EMERGENCY DEPARTMENT Provider Note   CSN: [id number] Arrival date & time: 03/10/20  2036     History Chief Complaint  Patient presents with  . Post-op Problem    Jeanette Aguirre is a 59 y.o. female.  Patient had a lipoma removed from the right thigh.  Patient states she has had fever and redness on her thigh.  Her primary care doctor started on Keflex.  The history is provided by the patient. No language interpreter was used.  Rash Location: Right thigh. Quality: not blistering   Severity:  Moderate Onset quality:  Sudden Timing:  Constant Progression:  Worsening Chronicity:  New Context: not animal contact   Relieved by:  None tried Worsened by:  Nothing Ineffective treatments:  None tried Associated symptoms: no abdominal pain, no diarrhea, no fatigue and no headaches        Past Medical History:  Diagnosis Date  . Anxiety   . Depression   . Diabetes mellitus without complication ([place])   . Family history of adverse reaction to anesthesia   . Gastroparesis   . GERD (gastroesophageal reflux disease)   . Hyperlipemia   . Pneumonia   . PONV (postoperative nausea and vomiting)     Patient Active Problem List   Diagnosis Date Noted  . Lipoma of right lower extremity   . Fatty liver 05/10/2019  . GERD (gastroesophageal reflux disease) 08/23/2018  . Bloating 08/23/2018  . Normocytic anemia 03/18/2018  . Chronic diarrhea 11/27/2017  . Nausea with vomiting 11/27/2017  . Hepatomegaly 11/27/2017  . Flu 10/02/2016  . Community acquired pneumonia 10/01/2016  . Smoker 10/01/2016  . Hypokalemia 10/01/2016  . Acute respiratory failure with hypoxemia ([place]) 10/01/2016    Past Surgical History:  Procedure Laterality Date  . CHOLECYSTECTOMY    . COLONOSCOPY N/A 09/04/2017   Dr. Gala Romney: multiple tubular adenomas removed, diverticulosis. next TCS in 3 years.   . ESOPHAGOGASTRODUODENOSCOPY N/A 04/09/2018   Dr. Gala Romney: inflammation of esophagus c/w acid reflux.    Marland Kitchen LIPOMA EXCISION Right 03/07/2020   Procedure: EXCISION LIPOMA, THIGH;  Surgeon: Aviva Signs, MD;  Location: [facility];  Service: General;  Laterality: Right;  . TONSILLECTOMY     and adenoids  . TUBAL LIGATION    . TYMPANOSTOMY TUBE PLACEMENT       OB History   No obstetric history on file.     Family History  Problem Relation Age of Onset  . Diabetes Mother   . Peripheral vascular disease Mother   . Diabetes Father   . Colitis Daughter 56       lymphocytic colitis  . Colon cancer Neg Hx     Social History   Tobacco Use  . Smoking status: Former Smoker    Packs/day: 1.00    Years: 15.00    Pack years: 15.00    Types: Cigarettes    Quit date: 09/22/2016    Years since quitting: 3.4  . Smokeless tobacco: Never Used  Vaping Use  . Vaping Use: Never used  Substance Use Topics  . Alcohol use: Not Currently    Comment: very little  . Drug use: No    Home Medications Prior to Admission medications   Medication Sig Start Date End Date Taking? Authorizing Provider  atorvastatin (LIPITOR) 20 MG tablet Take 20 mg by mouth at bedtime.    Yes [provider]  cephALEXin (KEFLEX) 500 MG capsule Take 500 mg by mouth 2 (two) times daily. 7 day course starting  on 03/09/2020 03/09/20  Yes [provider]  FLUoxetine (PROZAC) 40 MG capsule Take 40 mg by mouth in the morning.  11/21/19  Yes [provider]  glimepiride (AMARYL) 4 MG tablet Take 4 mg by mouth 2 (two) times daily.    Yes [provider]  loratadine (CLARITIN) 10 MG tablet Take 10 mg by mouth daily as needed for allergies.   Yes [provider]  NON FORMULARY Place 1 patch onto the skin daily. THRIVE; Weight Management Mental Acuity Supports Appetite Management Database administrator Supports Energy & Circulation   Yes [provider]  pantoprazole (PROTONIX) 40 MG tablet TAKE 1 TABLET BY MOUTH ONCE DAILY 30 MINUTES BEFORE BREAKFAST Patient taking differently: Take  40 mg by mouth every morning.  06/01/19  Yes Mahala Menghini, PA-C  sitaGLIPtin-metformin (JANUMET) 50-1000 MG tablet Take 1 tablet by mouth 2 (two) times daily with a meal.   Yes [provider]  traMADol (ULTRAM) 50 MG tablet Take 1 tablet (50 mg total) by mouth every 6 (six) hours as needed for moderate pain. 03/07/20  Yes Aviva Signs, MD    Allergies    Betadine [povidone iodine], Ciprofloxacin, and Lisinopril  Review of Systems   Review of Systems  Constitutional: Negative for appetite change and fatigue.  HENT: Negative for congestion, ear discharge and sinus pressure.   Eyes: Negative for discharge.  Respiratory: Negative for cough.   Cardiovascular: Negative for chest pain.  Gastrointestinal: Negative for abdominal pain and diarrhea.  Genitourinary: Negative for frequency and hematuria.  Musculoskeletal: Negative for back pain.       Thigh pain  Skin: Positive for rash.  Neurological: Negative for seizures and headaches.  Psychiatric/Behavioral: Negative for hallucinations.    Physical Exam Updated Vital Signs BP (!) 160/75 (BP Location: Right Arm)   Pulse 90   Temp 99.3 F (37.4 C) (Oral)   Ht 5\' 6"  (1.676 m)   Wt 116.1 kg   LMP 03/03/2012   SpO2 93%   BMI 41.31 kg/m   Physical Exam Vitals reviewed.  Constitutional:      Appearance: She is well-developed.  HENT:     Head: Normocephalic.     Nose: Nose normal.  Eyes:     General: No scleral icterus.    Conjunctiva/sclera: Conjunctivae normal.  Neck:     Thyroid: No thyromegaly.  Cardiovascular:     Rate and Rhythm: Normal rate and regular rhythm.     Heart sounds: No murmur heard.  No friction rub. No gallop.   Pulmonary:     Breath sounds: No stridor. No wheezing or rales.  Chest:     Chest Griesinger: No tenderness.  Abdominal:     General: There is no distension.     Tenderness: There is no abdominal tenderness. There is no rebound.  Musculoskeletal:        General: Normal range of motion.      Cervical back: Neck supple.  Lymphadenopathy:     Cervical: No cervical adenopathy.  Skin:    Findings: Erythema and rash present.     Comments: Rash to right thigh with tenderness  Neurological:     Mental Status: She is alert and oriented to person, place, and time.     Motor: No abnormal muscle tone.     Coordination: Coordination normal.  Psychiatric:        Behavior: Behavior normal.     ED Results / Procedures / Treatments   Labs (all labs  ordered are listed, but only abnormal results are displayed) Labs Reviewed  COMPREHENSIVE METABOLIC PANEL - Abnormal; Notable for the following components:      Result Value   Potassium 3.4 (*)    Glucose, Bld 220 (*)    Calcium 8.4 (*)    Albumin 3.4 (*)    All other components within normal limits  CBC WITH DIFFERENTIAL/PLATELET - Abnormal; Notable for the following components:   RBC 3.66 (*)    Hemoglobin 10.0 (*)    HCT 31.5 (*)    All other components within normal limits    EKG None  Radiology No results found.  Procedures .Marland KitchenIncision and Drainage  Date/Time: 03/12/2020 11:44 AM Performed by: Milton Ferguson, MD Authorized by: Milton Ferguson, MD   Comments:     Patient had a rash to right thigh and tenderness which was mildly fluctuant.  Area was cleaned with alcohol.  Patient was anesthetized with 1% lidocaine.  A number 18-gauge needle was used to aspirate superior to the incision and inferior to the incision.  No fluid was removed   (including critical care time)  Medications Ordered in ED Medications  cefTRIAXone (ROCEPHIN) 1 g in sodium chloride 0.9 % 100 mL IVPB (0 g Intravenous Stopped 03/10/20 2215)  lidocaine HCl (PF) (XYLOCAINE) 2 % injection (  Given by Other 03/10/20 2214)    ED Course  I have reviewed the triage vital signs and the nursing notes.  Pertinent labs & imaging results that were available during my care of the patient were reviewed by me and considered in my medical decision making (see  chart for details). I spoke with the surgeon Dr. Arnoldo Morale who took care of the patient.  He stated not to get any imaging.  I used a needle to aspirate the air and was unable to get any fluid out.  I aspirated superior and inferior to the incision.  I spoke with general surgery and we will continue her on the Keflex and she will be seen Tuesday   MDM Rules/Calculators/A&P                          Patient with inflamed tissue around incision site possible cellulitis possible allergic reaction to lidocaine.  She will continue Keflex and follow-up with her surgeon          This patient presents to the ED for concern of rash to her thigh, this involves an extensive number of treatment options, and is a complaint that carries with it a high risk of complications and morbidity.  The differential diagnosis includes cellulitis   Lab Tests:   I Ordered, reviewed, and interpreted labs, which included CBC and chemistries which showed mild anemia  Medicines ordered:  I ordered medication lidocaine to anesthetize the skin Imaging Studies ordered:   Additional history obtained:   Additional history obtained from relative  Previous records obtained and reviewed.  Consultations Obtained:   I consulted general surgery and discussed lab and imaging findings  Reevaluation:  After the interventions stated above, I reevaluated the patient and found no change  Critical Interventions:  .   Final Clinical Impression(s) / ED Diagnoses Final diagnoses:  Cellulitis of right leg    Rx / DC Orders ED Discharge Orders    None       Milton Ferguson, MD 03/12/20 1146

## 2020-03-10 NOTE — Discharge Instructions (Addendum)
Follow-up with Dr. Arnoldo Morale 930 on Tuesday.  Continue taking your Keflex.  You can take Benadryl for itching

## 2020-03-10 NOTE — ED Triage Notes (Signed)
Pt c/o incisional redness, warmth, and a fever on her right leg that started yesterday. Pt called Dr. Arnoldo Morale on Thursday who stated it was normal. Pt then called Dr. Gerarda Fraction yesterday who started her on keflex 500mg  BID. Pt states the highest her fever got was 101.9 at home.

## 2020-03-10 NOTE — ED Notes (Signed)
Pt with recent surgery to R upper lateral thigh   She has concerns that wound is getting infected  Wound edges are well approximated, wound appears healing  Skin is not overtly reddened   She reports feels hot to her touch and is hard   Dr Roderic Palau in to evaluation

## 2020-03-13 ENCOUNTER — Other Ambulatory Visit: Payer: Self-pay

## 2020-03-13 ENCOUNTER — Encounter: Payer: Self-pay | Admitting: General Surgery

## 2020-03-13 ENCOUNTER — Ambulatory Visit (INDEPENDENT_AMBULATORY_CARE_PROVIDER_SITE_OTHER): Payer: Self-pay | Admitting: General Surgery

## 2020-03-13 VITALS — BP 121/74 | HR 72 | Temp 97.3°F | Resp 18 | Ht 66.0 in | Wt 257.0 lb

## 2020-03-13 DIAGNOSIS — Z09 Encounter for follow-up examination after completed treatment for conditions other than malignant neoplasm: Secondary | ICD-10-CM

## 2020-03-13 NOTE — Progress Notes (Signed)
Subjective:     Jeanette Aguirre  Here for postoperative visit and wound check.  She had an episode of redness and fevers immediately after the surgery.  She was evaluated by the emergency room several days ago and no leukocytosis was noted.  She did contact her primary care physician who put her on Kefzol.  She has had no drainage from the wound.  Since her visit to the emergency room, the redness has resolved.  Her fevers have resolved. Objective:    BP 121/74   Pulse 72   Temp (!) 97.3 F (36.3 C) (Other (Comment))   Resp 18   Ht 5\' 6"  (1.676 m)   Wt 257 lb (116.6 kg)   LMP 03/03/2012   SpO2 96%   BMI 41.48 kg/m   General:  alert, cooperative and no distress  Right thigh wound is healing well without significant erythema or induration.  No fluctuance is present. Final pathology consistent with diagnosis and patient aware.     Assessment:    Doing well postoperatively. The etiology of the redness and fevers is unknown.  This may have been a reaction to the Exparel that was instilled into the wound.  No evidence of infection.    Plan:  She will complete her course of antibiotics.  Follow-up here as needed.

## 2020-04-23 ENCOUNTER — Ambulatory Visit (INDEPENDENT_AMBULATORY_CARE_PROVIDER_SITE_OTHER): Payer: PRIVATE HEALTH INSURANCE | Admitting: Gastroenterology

## 2020-04-23 ENCOUNTER — Encounter: Payer: Self-pay | Admitting: Internal Medicine

## 2020-04-23 ENCOUNTER — Other Ambulatory Visit: Payer: Self-pay

## 2020-04-23 ENCOUNTER — Encounter: Payer: Self-pay | Admitting: Gastroenterology

## 2020-04-23 VITALS — BP 130/66 | HR 70 | Temp 97.5°F | Ht 66.0 in | Wt 256.2 lb

## 2020-04-23 DIAGNOSIS — K219 Gastro-esophageal reflux disease without esophagitis: Secondary | ICD-10-CM

## 2020-04-23 DIAGNOSIS — R160 Hepatomegaly, not elsewhere classified: Secondary | ICD-10-CM

## 2020-04-23 DIAGNOSIS — K760 Fatty (change of) liver, not elsewhere classified: Secondary | ICD-10-CM

## 2020-04-23 DIAGNOSIS — Z86010 Personal history of colonic polyps: Secondary | ICD-10-CM

## 2020-04-23 DIAGNOSIS — D649 Anemia, unspecified: Secondary | ICD-10-CM | POA: Diagnosis not present

## 2020-04-23 MED ORDER — PANTOPRAZOLE SODIUM 40 MG PO TBEC: DELAYED_RELEASE_TABLET | ORAL | 3 refills | Status: DC

## 2020-04-23 NOTE — Progress Notes (Signed)
Primary Care Physician: Redmond School, MD  Primary Gastroenterologist:  Garfield Cornea, MD   Chief Complaint  Patient presents with  . Diarrhea    about once a week  . Gastroesophageal Reflux    doing okay so far    HPI: Jeanette Aguirre is a 59 y.o. female here for follow-up.  She was last seen in March 2021.  She has a history of hepatomegaly with fatty liver, anemia, GERD, possible mild gastroparesis.  EGD July 2019, inflammation of the esophagus consistent with acid reflux. Celiac serologies and H. pylori antibody negative in March 2019. Colonoscopy in 2018, multiple tubular adenomas, repeat recommended in December 2021. GES 01/2019: normal emptying at 1 and 2 hours, but overal delayed.   Diarrhea once per week, will take Imodium when needed with good results.  Has not been able to figure out any particular food triggers.  Symptoms self-limiting.  More normal BMs then in the past.  Reflux well controlled.  No dysphagia.  A1c slightly better but still significantly elevated.  No significant weight changes.  Denies abdominal pain.  Hemoglobin 10.1 in the ED 3 days after her surgery for removal of a lipoma. Hemoglobin had been normal back in March.  We will follow-up on this.   Current Outpatient Medications  Medication Sig Dispense Refill  . atorvastatin (LIPITOR) 20 MG tablet Take 20 mg by mouth at bedtime.     Marland Kitchen FLUoxetine (PROZAC) 40 MG capsule Take 40 mg by mouth in the morning.     Marland Kitchen glimepiride (AMARYL) 4 MG tablet Take 4 mg by mouth 2 (two) times daily.     Marland Kitchen loratadine (CLARITIN) 10 MG tablet Take 10 mg by mouth daily as needed for allergies.    . pantoprazole (PROTONIX) 40 MG tablet TAKE 1 TABLET BY MOUTH ONCE DAILY 30 MINUTES BEFORE BREAKFAST (Patient taking differently: Take 40 mg by mouth every morning. ) 90 tablet 3  . sitaGLIPtin-metformin (JANUMET) 50-1000 MG tablet Take 1 tablet by mouth 2 (two) times daily with a meal.    . traMADol (ULTRAM) 50 MG tablet Take  1 tablet (50 mg total) by mouth every 6 (six) hours as needed for moderate pain. 25 tablet 0   No current facility-administered medications for this visit.    Allergies as of 04/23/2020 - Review Complete 04/23/2020  Allergen Reaction Noted  . Betadine [povidone iodine] Swelling 10/01/2016  . Ciprofloxacin Nausea Only 11/27/2017  . Lisinopril Swelling 08/23/2018   Past Medical History:  Diagnosis Date  . Anxiety   . Depression   . Diabetes mellitus without complication ([place])   . Family history of adverse reaction to anesthesia   . Gastroparesis   . GERD (gastroesophageal reflux disease)   . Hyperlipemia   . Pneumonia   . PONV (postoperative nausea and vomiting)    Past Surgical History:  Procedure Laterality Date  . CHOLECYSTECTOMY    . COLONOSCOPY N/A 09/04/2017   Dr. Gala Romney: multiple tubular adenomas removed, diverticulosis. next TCS in 3 years.   . ESOPHAGOGASTRODUODENOSCOPY N/A 04/09/2018   Dr. Gala Romney: inflammation of esophagus c/w acid reflux.   Marland Kitchen LIPOMA EXCISION Right 03/07/2020   Procedure: EXCISION LIPOMA, THIGH;  Surgeon: Aviva Signs, MD;  Location: [facility];  Service: General;  Laterality: Right;  . TONSILLECTOMY     and adenoids  . TUBAL LIGATION    . TYMPANOSTOMY TUBE PLACEMENT     Family History  Problem Relation Age of Onset  . Diabetes Mother   .  Peripheral vascular disease Mother   . Diabetes Father   . Colitis Daughter 24       lymphocytic colitis  . Colon cancer Neg Hx    Social History   Tobacco Use  . Smoking status: Former Smoker    Packs/day: 1.00    Years: 15.00    Pack years: 15.00    Types: Cigarettes    Quit date: 09/22/2016    Years since quitting: 3.5  . Smokeless tobacco: Never Used  Vaping Use  . Vaping Use: Never used  Substance Use Topics  . Alcohol use: Not Currently    Comment: very little  . Drug use: No    ROS:  General: Negative for anorexia, weight loss, fever, chills, fatigue, weakness. ENT: Negative for hoarseness,  difficulty swallowing , nasal congestion. CV: Negative for chest pain, angina, palpitations, dyspnea on exertion, peripheral edema.  Respiratory: Negative for dyspnea at rest, dyspnea on exertion, cough, sputum, wheezing.  GI: See history of present illness. GU:  Negative for dysuria, hematuria, urinary incontinence, urinary frequency, nocturnal urination.  Endo: Negative for unusual weight change.    Physical Examination:   BP (!) 130/66   Pulse 70   Temp (!) 97.5 F (36.4 C)   Ht 5\' 6"  (1.676 m)   Wt (!) 256 lb 3.2 oz (116.2 kg)   LMP 03/03/2012   BMI 41.35 kg/m   General: Well-nourished, well-developed in no acute distress.  Eyes: No icterus. Mouth: masked Lungs: Clear to auscultation bilaterally.  Heart: Regular rate and rhythm, no murmurs rubs or gallops.  Abdomen: Bowel sounds are normal, nontender, nondistended, splenomegaly or masses, no abdominal bruits or hernia , no rebound or guarding.  Liver edge easily palpable in RUQ Extremities: No lower extremity edema. No clubbing or deformities. Neuro: Alert and oriented x 4   Skin: Warm and dry, no jaundice.   Psych: Alert and cooperative, normal mood and affect.  Labs:  Lab Results  Component Value Date   CREATININE 0.69 03/10/2020   BUN 8 03/10/2020   NA 139 03/10/2020   K 3.4 (L) 03/10/2020   CL 101 03/10/2020   CO2 27 03/10/2020   Lab Results  Component Value Date   ALT 42 03/10/2020   AST 41 03/10/2020   ALKPHOS 110 03/10/2020   BILITOT 0.6 03/10/2020   Lab Results  Component Value Date   WBC 6.7 03/10/2020   HGB 10.0 (L) 03/10/2020   HCT 31.5 (L) 03/10/2020   MCV 86.1 03/10/2020   PLT 253 03/10/2020   Lab Results  Component Value Date   IRON 32 08/15/2019   TIBC 308 08/15/2019   FERRITIN 29 11/18/2019     Lab Results  Component Value Date   HGBA1C 7.9 (H) 03/05/2020    Imaging Studies: No results found.  Impression/Plan: 59 y/o female presenting for follow up of GERD, fatty  liver/hepatomegaly, anemia, h/o adenomatous colon polyps.   GERD: well controlled on pantoprazole. Reinforced antireflux measures.   Hepatomegaly/fatty liver: LFTs normal. Recommend tight glycemic control, weight reduction, daily exercise. Will continue to follow every six months.   Anemia: recent H/H with decline. Possibly dilutional with fluids given at time of surgery. Will recheck.   H/O colonic adenomas. She will be due in 08/2020. Will triage her closer to time for procedure. Plan for conscious sedation. ASA III.

## 2020-04-23 NOTE — Patient Instructions (Signed)
1. Continue pantoprazole 40mg  daily before breakfast for reflux. 2. We will schedule your colonoscopy in December once schedule is available. 3. Please have labs done at [facility] in the next 1-2 weeks.  4. Work on daily exercise, weight reduction, and tight glycemic control for your fatty liver (enlarged liver).    Fatty Liver Disease  Fatty liver disease occurs when too much fat has built up in your liver cells. Fatty liver disease is also called hepatic steatosis or steatohepatitis. The liver removes harmful substances from your bloodstream and produces fluids that your body needs. It also helps your body use and store energy from the food you eat. In many cases, fatty liver disease does not cause symptoms or problems. It is often diagnosed when tests are being done for other reasons. However, over time, fatty liver can cause inflammation that may lead to more serious liver problems, such as scarring of the liver (cirrhosis) and liver failure. Fatty liver is associated with insulin resistance, increased body fat, high blood pressure (hypertension), and high cholesterol. These are features of metabolic syndrome and increase your risk for stroke, diabetes, and heart disease. What are the causes? This condition may be caused by:  Drinking too much alcohol.  Poor nutrition.  Obesity.  Cushing's syndrome.  Diabetes.  High cholesterol.  Certain drugs.  Poisons.  Some viral infections.  Pregnancy. What increases the risk? You are more likely to develop this condition if you:  Abuse alcohol.  Are overweight.  Have diabetes.  Have hepatitis.  Have a high triglyceride level.  Are pregnant. What are the signs or symptoms? Fatty liver disease often does not cause symptoms. If symptoms do develop, they can include:  Fatigue.  Weakness.  Weight loss.  Confusion.  Abdominal pain.  Nausea and vomiting.  Yellowing of your skin and the white parts of your eyes  (jaundice).  Itchy skin. How is this diagnosed? This condition may be diagnosed by:  A physical exam and medical history.  Blood tests.  Imaging tests, such as an ultrasound, CT scan, or MRI.  A liver biopsy. A small sample of liver tissue is removed using a needle. The sample is then looked at under a microscope. How is this treated? Fatty liver disease is often caused by other health conditions. Treatment for fatty liver may involve medicines and lifestyle changes to manage conditions such as:  Alcoholism.  High cholesterol.  Diabetes.  Being overweight or obese. Follow these instructions at home:   Do not drink alcohol. If you have trouble quitting, ask your health care provider how to safely quit with the help of medicine or a supervised program. This is important to keep your condition from getting worse.  Eat a healthy diet as told by your health care provider. Ask your health care provider about working with a diet and nutrition specialist (dietitian) to develop an eating plan.  Exercise regularly. This can help you lose weight and control your cholesterol and diabetes. Talk to your health care provider about an exercise plan and which activities are best for you.  Take over-the-counter and prescription medicines only as told by your health care provider.  Keep all follow-up visits as told by your health care provider. This is important. Contact a health care provider if: You have trouble controlling your:  Blood sugar. This is especially important if you have diabetes.  Cholesterol.  Drinking of alcohol. Get help right away if:  You have abdominal pain.  You have jaundice.  You  have nausea and vomiting.  You vomit blood or material that looks like coffee grounds.  You have stools that are black, tar-like, or bloody. Summary  Fatty liver disease develops when too much fat builds up in the cells of your liver.  Fatty liver disease often causes no  symptoms or problems. However, over time, fatty liver can cause inflammation that may lead to more serious liver problems, such as scarring of the liver (cirrhosis).  You are more likely to develop this condition if you abuse alcohol, are pregnant, are overweight, have diabetes, have hepatitis, or have high triglyceride levels.  Contact your health care provider if you have trouble controlling your weight, blood sugar, cholesterol, or drinking of alcohol. This information is not intended to replace advice given to you by your health care provider. Make sure you discuss any questions you have with your health care provider. Document Revised: 08/21/2017 Document Reviewed: 06/17/2017 Elsevier Patient Education  2020 [employer].

## 2020-05-09 ENCOUNTER — Telehealth: Payer: Self-pay | Admitting: Internal Medicine

## 2020-05-09 NOTE — Telephone Encounter (Signed)
[phone] please call patient, she is sick and had a few questions    She also had the vaccine and wanted to know if it could be related

## 2020-05-10 NOTE — Telephone Encounter (Signed)
I would have expected if her symptoms were due to the vaccine that she would have had symptoms sooner after the shot. Most people have symptoms within the first couple of days.   I would be concerned about either gastroenteritis vs covid. She should consider covid testing.   Would also advise she reach out to her PCP given fever and fatigue to see what they recommend.   If she has persistent diarrhea, we could consider stool studies. Any recent antibiotic use?

## 2020-05-10 NOTE — Telephone Encounter (Signed)
Spoke with pt. Pt was notified of Neil Crouch, PA recommendations. Pt was advised to reach out to her PCP about the fever and fatigue. Pt is also aware that if the diarrhea continues, we can consider stool studies. Pt hasn't been on any recent antibiotics.

## 2020-05-10 NOTE — Telephone Encounter (Signed)
Spoke with pt. Pt states she took the [place] and [place] Vaccine last Monday 04/30/20. Pt started feeling a little sick Tuesday night 05/08/20. When pt woke up on 05/09/20, pt was sick. Pt had c/o vomiting diarrhea and fever. Pt is feeling a little better today but expressed that she feels warn down some. Pt is asking if her symptoms were related to the vaccine? Pt says she thought it was just her normal Jerrye Bushy that she has. Pt hasn't consulted with her PCP.

## 2020-05-11 NOTE — Telephone Encounter (Signed)
Noted. No additional recommendations.

## 2020-06-25 ENCOUNTER — Other Ambulatory Visit (HOSPITAL_COMMUNITY): Payer: Self-pay | Admitting: Neurology

## 2020-06-25 ENCOUNTER — Other Ambulatory Visit (HOSPITAL_COMMUNITY): Payer: Self-pay | Admitting: Internal Medicine

## 2020-06-25 DIAGNOSIS — Z1231 Encounter for screening mammogram for malignant neoplasm of breast: Secondary | ICD-10-CM

## 2020-07-03 ENCOUNTER — Ambulatory Visit (HOSPITAL_COMMUNITY)
Admission: RE | Admit: 2020-07-03 | Discharge: 2020-07-03 | Disposition: A | Discharge status: Home / Self Care | Payer: PRIVATE HEALTH INSURANCE | Source: Ambulatory Visit | Attending: Physician Assistant | Admitting: Physician Assistant

## 2020-07-03 ENCOUNTER — Other Ambulatory Visit: Payer: Self-pay

## 2020-07-03 ENCOUNTER — Other Ambulatory Visit (HOSPITAL_COMMUNITY): Payer: Self-pay | Admitting: Physician Assistant

## 2020-07-03 DIAGNOSIS — M25561 Pain in right knee: Secondary | ICD-10-CM

## 2020-07-09 ENCOUNTER — Ambulatory Visit (HOSPITAL_COMMUNITY)
Admission: RE | Admit: 2020-07-09 | Discharge: 2020-07-09 | Disposition: A | Discharge status: Home / Self Care | Payer: PRIVATE HEALTH INSURANCE | Source: Ambulatory Visit | Attending: Internal Medicine | Admitting: Internal Medicine

## 2020-07-09 ENCOUNTER — Other Ambulatory Visit: Payer: Self-pay

## 2020-07-09 DIAGNOSIS — Z1231 Encounter for screening mammogram for malignant neoplasm of breast: Secondary | ICD-10-CM | POA: Insufficient documentation

## 2020-08-21 ENCOUNTER — Encounter: Payer: Self-pay | Admitting: Internal Medicine

## 2020-08-31 ENCOUNTER — Other Ambulatory Visit (HOSPITAL_COMMUNITY): Payer: Self-pay | Admitting: Internal Medicine

## 2020-08-31 ENCOUNTER — Other Ambulatory Visit: Payer: Self-pay | Admitting: Internal Medicine

## 2020-08-31 DIAGNOSIS — R109 Unspecified abdominal pain: Secondary | ICD-10-CM

## 2020-09-05 ENCOUNTER — Other Ambulatory Visit: Payer: Self-pay

## 2020-09-05 ENCOUNTER — Ambulatory Visit (HOSPITAL_COMMUNITY)
Admission: RE | Admit: 2020-09-05 | Discharge: 2020-09-05 | Disposition: A | Discharge status: Home / Self Care | Payer: PRIVATE HEALTH INSURANCE | Source: Ambulatory Visit | Attending: Internal Medicine | Admitting: Internal Medicine

## 2020-09-05 DIAGNOSIS — R109 Unspecified abdominal pain: Secondary | ICD-10-CM | POA: Insufficient documentation

## 2020-09-05 MED ORDER — IOHEXOL 300 MG/ML  SOLN
100.0000 mL | Freq: Once | INTRAMUSCULAR | Status: AC | PRN
  Administered 2020-09-05: 100 mL via INTRAVENOUS

## 2020-09-07 LAB — POCT I-STAT CREATININE: Creatinine, Ser: 0.8 mg/dL (ref 0.44–1.00)

## 2020-09-20 ENCOUNTER — Ambulatory Visit (HOSPITAL_COMMUNITY): Payer: PRIVATE HEALTH INSURANCE

## 2020-11-21 ENCOUNTER — Other Ambulatory Visit: Payer: Self-pay

## 2020-11-21 ENCOUNTER — Encounter: Payer: Self-pay | Admitting: Internal Medicine

## 2020-11-21 ENCOUNTER — Ambulatory Visit (INDEPENDENT_AMBULATORY_CARE_PROVIDER_SITE_OTHER): Payer: PRIVATE HEALTH INSURANCE | Admitting: Gastroenterology

## 2020-11-21 ENCOUNTER — Encounter: Payer: Self-pay | Admitting: Gastroenterology

## 2020-11-21 VITALS — BP 117/71 | HR 77 | Temp 97.6°F | Ht 66.0 in | Wt 252.6 lb

## 2020-11-21 DIAGNOSIS — K760 Fatty (change of) liver, not elsewhere classified: Secondary | ICD-10-CM | POA: Diagnosis not present

## 2020-11-21 DIAGNOSIS — K219 Gastro-esophageal reflux disease without esophagitis: Secondary | ICD-10-CM

## 2020-11-21 DIAGNOSIS — Z86010 Personal history of colonic polyps: Secondary | ICD-10-CM

## 2020-11-21 DIAGNOSIS — E538 Deficiency of other specified B group vitamins: Secondary | ICD-10-CM

## 2020-11-21 DIAGNOSIS — D649 Anemia, unspecified: Secondary | ICD-10-CM | POA: Diagnosis not present

## 2020-11-21 MED ORDER — PEG 3350-KCL-NA BICARB-NACL 420 G PO SOLR: 4000.0000 mL | ORAL | 0 refills | Status: DC

## 2020-11-21 NOTE — Progress Notes (Signed)
Primary Care Physician: Redmond School, MD  Primary Gastroenterologist:  Garfield Cornea, MD   Chief Complaint  Patient presents with   fatty liver    F/u   Gastroesophageal Reflux    Doing okay    HPI: Jeanette Aguirre is a 60 y.o. female here for follow-up.  Last seen in August 2021. She has a history of hepatomegaly with fatty liver, anemia, B12 deficiency, GERD, possible mild gastroparesis.  EGD July 2019, inflammation of the esophagus consistent with acid reflux. Celiac serologies and H. pylori antibody negative in March 2019. Colonoscopy in 2018, multiple tubular adenomas, repeat recommended in December 2021. GES 01/2019: normal emptying at 1 and 2 hours, but overal delayed.  CT abdomen pelvis with contrast December 2021 for abdominal pain ordered by PCP.  Mild fatty liver.  Moderate stool throughout the colon.  Normal appendix.  Overall weight is down about 10 pounds in the past 18 months.  Feels like she is doing well.  Reflux well controlled.  No dysphagia.  Occasional upper abdominal discomfort but infrequent.  Overall BMs are good.  She has occasional diarrhea.  No blood in the stool or melena.  Noted to have a hemoglobin of 10.1 in the ED last year.  States she saw PCP not too long ago for fatigue.  B12 low again and she received 1 shot.  She received several B12 shots a couple of years ago but had come off of them.  Recently has been craving ice cold water.  Feels it is a side effect from [place].  Had Covid back in January, mild sinus symptoms.   Current Outpatient Medications  Medication Sig Dispense Refill   atorvastatin (LIPITOR) 20 MG tablet Take 20 mg by mouth at bedtime.      FLUoxetine (PROZAC) 40 MG capsule Take 40 mg by mouth in the morning.      glimepiride (AMARYL) 4 MG tablet Take 4 mg by mouth 2 (two) times daily.      JARDIANCE 25 MG TABS tablet Take 25 mg by mouth every morning.     loratadine (CLARITIN) 10 MG tablet Take 10 mg by mouth daily  as needed for allergies.     pantoprazole (PROTONIX) 40 MG tablet TAKE 1 TABLET BY MOUTH ONCE DAILY 30 MINUTES BEFORE BREAKFAST 90 tablet 3   sitaGLIPtin-metformin (JANUMET) 50-1000 MG tablet Take 1 tablet by mouth 2 (two) times daily with a meal.     No current facility-administered medications for this visit.    Allergies as of 11/21/2020 - Review Complete 11/21/2020  Allergen Reaction Noted   Betadine [povidone iodine] Swelling 10/01/2016   Ciprofloxacin Nausea Only 11/27/2017   Lisinopril Swelling 08/23/2018   Past Medical History:  Diagnosis Date   Anxiety    Depression    Diabetes mellitus without complication (HCC)    Family history of adverse reaction to anesthesia    Gastroparesis    GERD (gastroesophageal reflux disease)    Hyperlipemia    Pneumonia    PONV (postoperative nausea and vomiting)    Past Surgical History:  Procedure Laterality Date   CHOLECYSTECTOMY     COLONOSCOPY N/A 09/04/2017   Dr. Gala Romney: multiple tubular adenomas removed, diverticulosis. next TCS in 3 years.    ESOPHAGOGASTRODUODENOSCOPY N/A 04/09/2018   Dr. Gala Romney: inflammation of esophagus c/w acid reflux.    LIPOMA EXCISION Right 03/07/2020   Procedure: EXCISION LIPOMA, THIGH;  Surgeon: Aviva Signs, MD;  Location: [facility];  Service: General;  Laterality:  Right;   TONSILLECTOMY     and adenoids   TUBAL LIGATION     TYMPANOSTOMY TUBE PLACEMENT     Family History  Problem Relation Age of Onset   Diabetes Mother    Peripheral vascular disease Mother    Diabetes Father    Colitis Daughter 41       lymphocytic colitis   Colon cancer Neg Hx    Social History   Tobacco Use   Smoking status: Former Smoker    Packs/day: 1.00    Years: 15.00    Pack years: 15.00    Types: Cigarettes    Quit date: 09/22/2016    Years since quitting: 4.1   Smokeless tobacco: Never Used  Vaping Use   Vaping Use: Never used  Substance Use Topics   Alcohol use: Not Currently     Comment: very little   Drug use: No     ROS:  General: Negative for anorexia, weight loss, fever, chills, positive fatigue, weakness. ENT: Negative for hoarseness, difficulty swallowing , nasal congestion. CV: Negative for chest pain, angina, palpitations, dyspnea on exertion, peripheral edema.  Respiratory: Negative for dyspnea at rest, dyspnea on exertion, cough, sputum, wheezing.  GI: See history of present illness. GU:  Negative for dysuria, hematuria, urinary incontinence, urinary frequency, nocturnal urination.  Endo: Negative for unusual weight change.    Physical Examination:   BP 117/71    Pulse 77    Temp 97.6 F (36.4 C)    Ht 5\' 6"  (1.676 m)    Wt 252 lb 9.6 oz (114.6 kg)    LMP 03/03/2012    BMI 40.77 kg/m   General: Well-nourished, well-developed in no acute distress.  Eyes: No icterus. Mouth: masked Lungs: Clear to auscultation bilaterally.  Heart: Regular rate and rhythm, no murmurs rubs or gallops.  Abdomen: Bowel sounds are normal, nontender, nondistended, no hepatosplenomegaly or masses, no abdominal bruits or hernia , no rebound or guarding.   Extremities: No lower extremity edema. No clubbing or deformities. Neuro: Alert and oriented x 4   Skin: Warm and dry, no jaundice.   Psych: Alert and cooperative, normal mood and affect.  Labs:  Lab Results  Component Value Date   CREATININE 0.80 09/05/2020   BUN 8 03/10/2020   NA 139 03/10/2020   K 3.4 (L) 03/10/2020   CL 101 03/10/2020   CO2 27 03/10/2020   Lab Results  Component Value Date   ALT 42 03/10/2020   AST 41 03/10/2020   ALKPHOS 110 03/10/2020   BILITOT 0.6 03/10/2020   Lab Results  Component Value Date   WBC 6.7 03/10/2020   HGB 10.0 (L) 03/10/2020   HCT 31.5 (L) 03/10/2020   MCV 86.1 03/10/2020   PLT 253 03/10/2020   Lab Results  Component Value Date   IRON 32 08/15/2019   TIBC 308 08/15/2019   FERRITIN 29 11/18/2019   Lab Results  Component Value Date   FOLATE 6.4 11/25/2018    Lab Results  Component Value Date   VITAMINB12 159 (L) 11/25/2018       Imaging Studies: No results found.  Assessment:  Pleasant 60 y/o female with fatty liver, anemia, B12 def, GERD here for follow up.   Fatty liver: overall weight down 10 pounds. Trying to control DM. Due for labs.  Anemia: due for repeat labs, including B12.   H/O colonic adenomas: Due surveillance colonoscopy.   GERD: well controlled on PPI. Reinforced antireflux measures.   Plan:  1. Update labs, LFTs, CBC, iron/TIBC/ferritin, B12. 2. Continue pantoprazole 40 mg daily. 3. For fatty liver, continue to work towards tight glycemic control, slow gradual weight loss, physical activity as tolerated. 4. Colonoscopy in the near future with conscious sedation with Dr. Gala Romney. ASA III.  I have discussed the risks, alternatives, benefits with regards to but not limited to the risk of reaction to medication, bleeding, infection, perforation and the patient is agreeable to proceed. Written consent to be obtained.  5. Patient requesting Zofran to be given preoperatively to prevent nausea associated with anesthesia. 6. Return to the office for follow-up in October 2022.

## 2020-11-21 NOTE — Patient Instructions (Addendum)
1. Please have your labs done at [facility] at your convenience.  2. Colonoscopy as scheduled. See separate instructions.  3. Continue pantoprazole 40mg  daily before breakfast.  4. Continue to strive for tight control of your diabetes, slow gradual weight loss, physical activity as tolerated.  5. Return to the office in 06/2021.

## 2020-11-28 IMAGING — DX DG KNEE COMPLETE 4+V*R*
4 series · 4 of 4 positions shown · non-contrast
Comparison: None.

CLINICAL DATA: Right knee pain and swelling beginning 4 days ago.

EXAM:
RIGHT KNEE - COMPLETE 4+ VIEW

[knee ap]
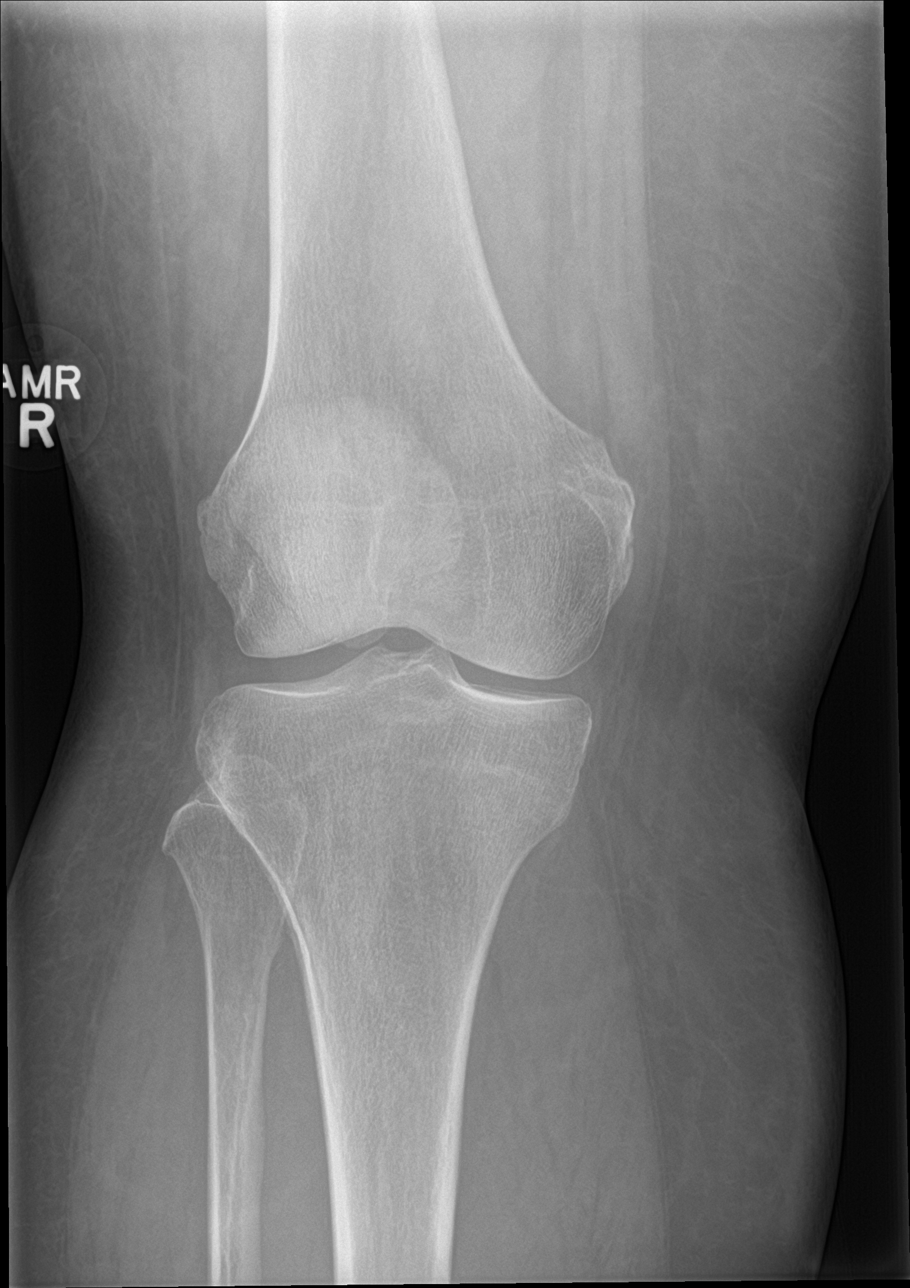

[knee obl (1 of 2)]
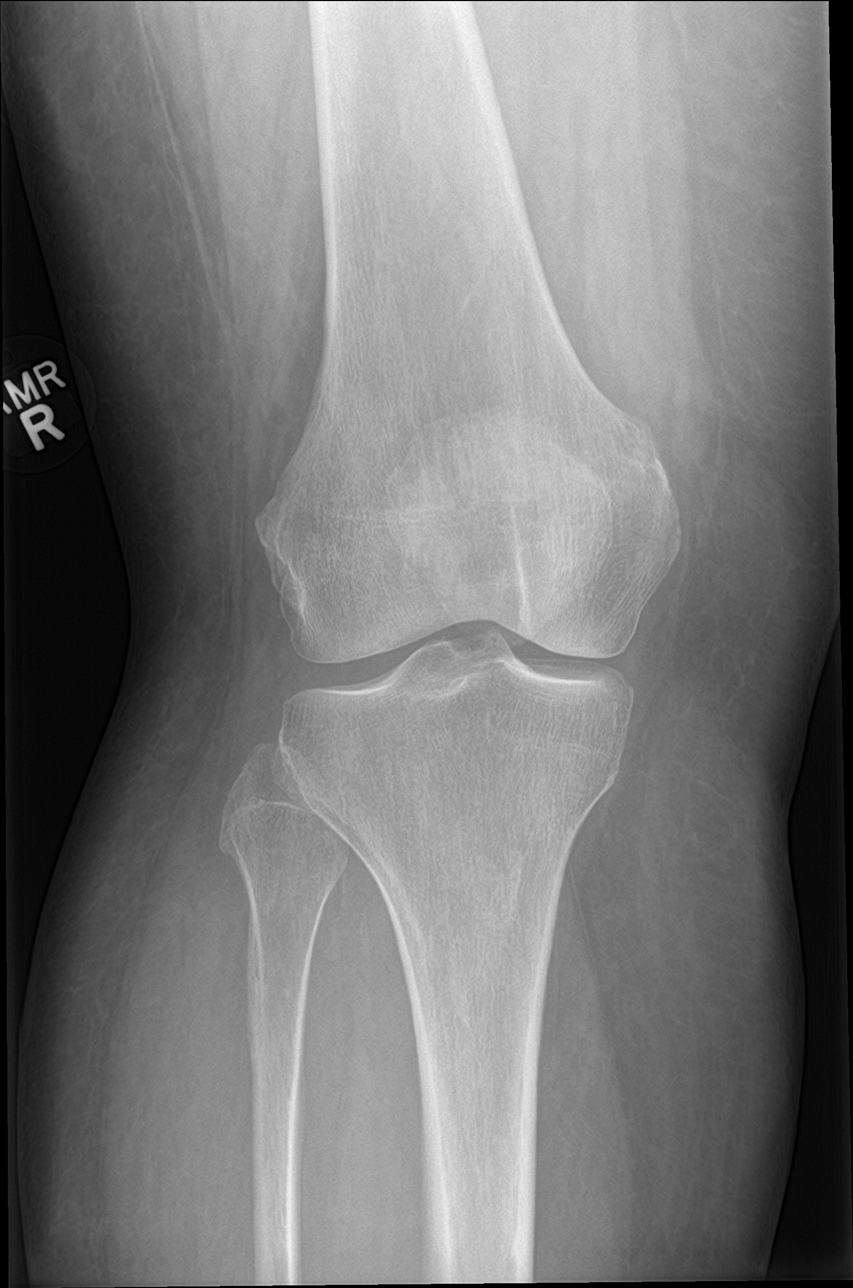

[knee obl (2 of 2)]
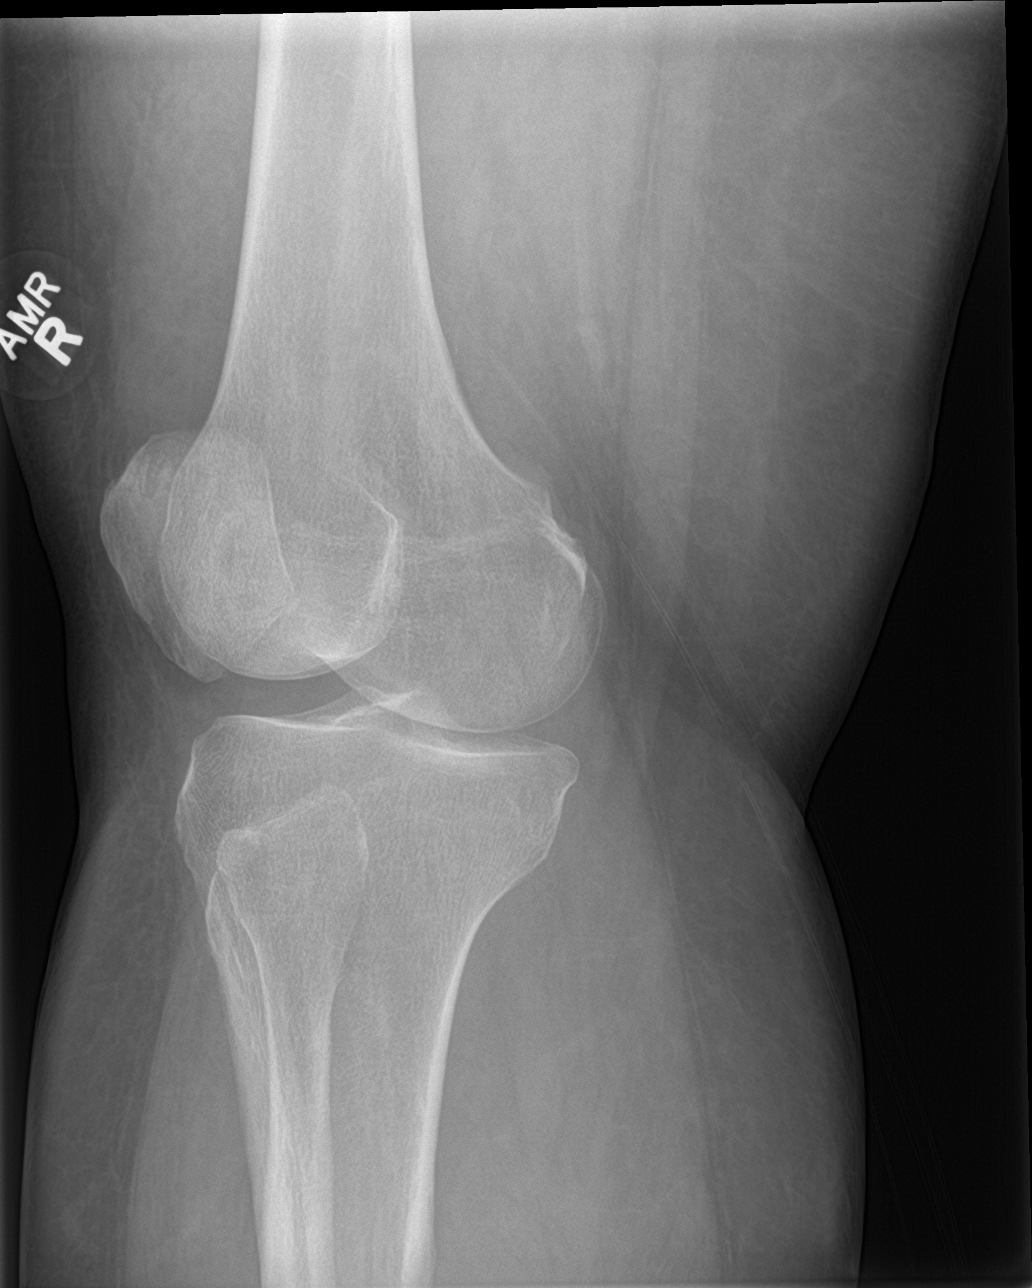

[knee lat]
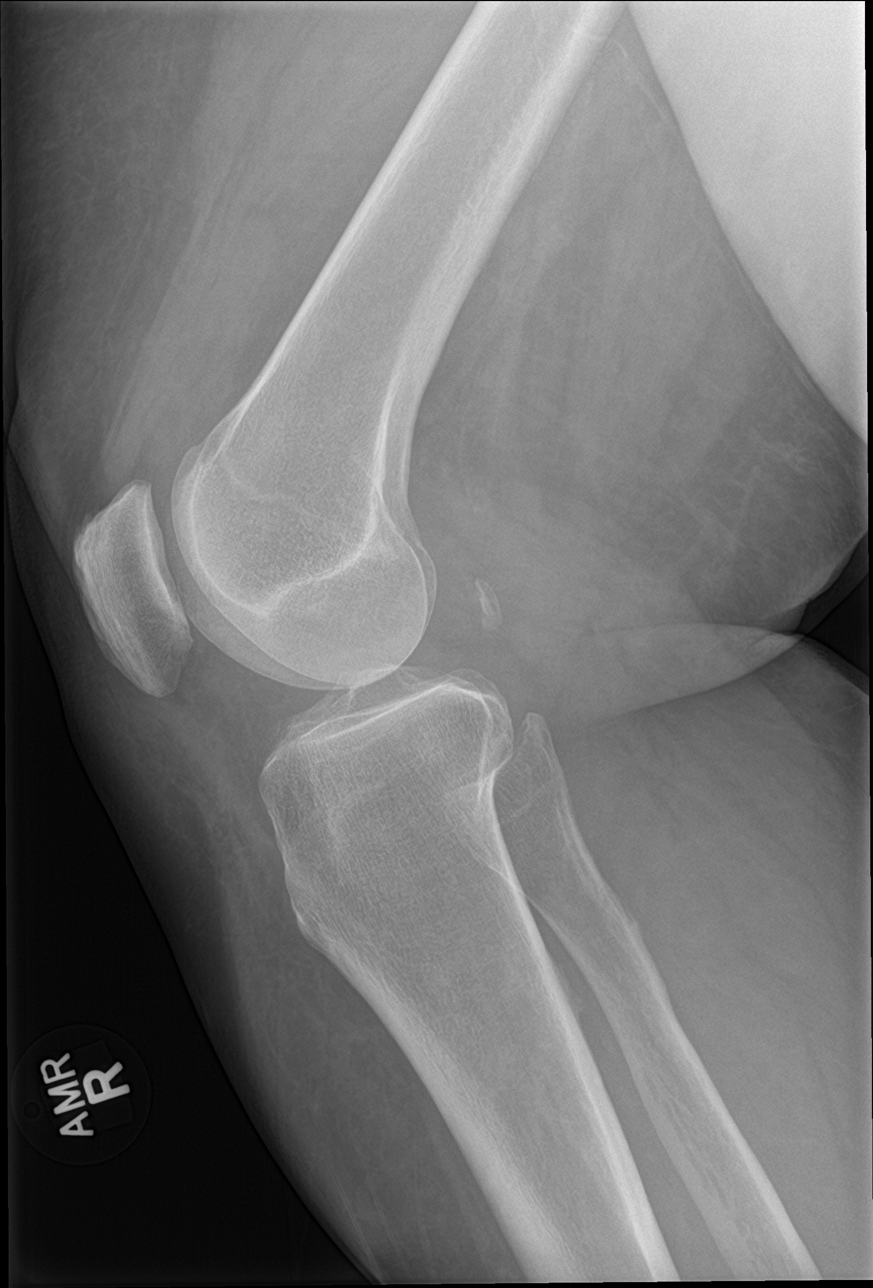

[4 of 4 positions shown; findings below may reference images not displayed]

FINDINGS: No acute fracture, dislocation, or knee joint effusion is
identified. Joint space widths are preserved. No destructive osseous
process is identified. There may be mild soft tissue swelling about
the knee. No radiopaque foreign body or subcutaneous emphysema is
evident.
IMPRESSION: No acute osseous abnormality identified.

## 2020-12-08 LAB — IRON,TIBC AND FERRITIN PANEL
Ferritin: 20 ng/mL (ref 15–150)
Iron Saturation: 10 % — ABNORMAL LOW (ref 15–55)
Iron: 33 ug/dL (ref 27–159)
Total Iron Binding Capacity: 332 ug/dL (ref 250–450)
UIBC: 299 ug/dL (ref 131–425)

## 2020-12-08 LAB — HEPATIC FUNCTION PANEL
ALT: 14 IU/L (ref 0–32)
AST: 16 IU/L (ref 0–40)
Albumin: 4.5 g/dL (ref 3.8–4.9)
Alkaline Phosphatase: 127 IU/L — ABNORMAL HIGH (ref 44–121)
Bilirubin Total: 0.3 mg/dL (ref 0.0–1.2)
Bilirubin, Direct: <0.1 mg/dL (ref 0.00–0.40)
Total Protein: 6.8 g/dL (ref 6.0–8.5)

## 2020-12-08 LAB — CBC WITH DIFFERENTIAL/PLATELET
Basophils Absolute: 0.1 10*3/uL (ref 0.0–0.2)
Basos: 1 %
EOS (ABSOLUTE): 0.1 10*3/uL (ref 0.0–0.4)
Eos: 1 %
Hematocrit: 35 % (ref 34.0–46.6)
Hemoglobin: 11.3 g/dL (ref 11.1–15.9)
Immature Grans (Abs): 0 10*3/uL (ref 0.0–0.1)
Immature Granulocytes: 0 %
Lymphocytes Absolute: 3.8 10*3/uL — ABNORMAL HIGH (ref 0.7–3.1)
Lymphs: 39 %
MCH: 26.3 pg — ABNORMAL LOW (ref 26.6–33.0)
MCHC: 32.3 g/dL (ref 31.5–35.7)
MCV: 81 fL (ref 79–97)
Monocytes Absolute: 0.4 10*3/uL (ref 0.1–0.9)
Monocytes: 4 %
Neutrophils Absolute: 5.3 10*3/uL (ref 1.4–7.0)
Neutrophils: 55 %
Platelets: 367 10*3/uL (ref 150–450)
RBC: 4.3 x10E6/uL (ref 3.77–5.28)
RDW: 13.7 % (ref 11.7–15.4)
WBC: 9.7 10*3/uL (ref 3.4–10.8)

## 2020-12-08 LAB — VITAMIN B12: Vitamin B-12: 252 pg/mL (ref 232–1245)

## 2020-12-11 ENCOUNTER — Other Ambulatory Visit: Payer: Self-pay

## 2020-12-11 ENCOUNTER — Telehealth: Payer: Self-pay

## 2020-12-11 DIAGNOSIS — R748 Abnormal levels of other serum enzymes: Secondary | ICD-10-CM

## 2020-12-11 DIAGNOSIS — D649 Anemia, unspecified: Secondary | ICD-10-CM

## 2020-12-11 DIAGNOSIS — R790 Abnormal level of blood mineral: Secondary | ICD-10-CM

## 2020-12-11 NOTE — Telephone Encounter (Signed)
Called [facility], spoke to [place], no PA needed for TCS. Ref# teresas.

## 2021-01-16 ENCOUNTER — Other Ambulatory Visit (HOSPITAL_COMMUNITY)
Admission: RE | Admit: 2021-01-16 | Discharge: 2021-01-16 | Disposition: A | Discharge status: Home / Self Care | Payer: PRIVATE HEALTH INSURANCE | Source: Ambulatory Visit | Attending: Internal Medicine | Admitting: Internal Medicine

## 2021-01-16 ENCOUNTER — Other Ambulatory Visit: Payer: Self-pay

## 2021-01-16 DIAGNOSIS — Z01812 Encounter for preprocedural laboratory examination: Secondary | ICD-10-CM | POA: Insufficient documentation

## 2021-01-16 DIAGNOSIS — Z20822 Contact with and (suspected) exposure to covid-19: Secondary | ICD-10-CM | POA: Diagnosis not present

## 2021-01-17 LAB — SARS CORONAVIRUS 2 (TAT 6-24 HRS): SARS Coronavirus 2: NEGATIVE

## 2021-01-18 ENCOUNTER — Other Ambulatory Visit: Payer: Self-pay

## 2021-01-18 ENCOUNTER — Encounter (HOSPITAL_COMMUNITY)
Admission: RE | Disposition: A | Discharge status: Home / Self Care | Payer: Self-pay | Source: Home / Self Care | Attending: Internal Medicine

## 2021-01-18 ENCOUNTER — Encounter (HOSPITAL_COMMUNITY): Payer: Self-pay | Admitting: Internal Medicine

## 2021-01-18 ENCOUNTER — Ambulatory Visit (HOSPITAL_COMMUNITY)
Admission: RE | Admit: 2021-01-18 | Discharge: 2021-01-18 | Disposition: A | Discharge status: Home / Self Care | Payer: PRIVATE HEALTH INSURANCE | Attending: Internal Medicine | Admitting: Internal Medicine

## 2021-01-18 DIAGNOSIS — Z888 Allergy status to other drugs, medicaments and biological substances status: Secondary | ICD-10-CM | POA: Diagnosis not present

## 2021-01-18 DIAGNOSIS — Z79899 Other long term (current) drug therapy: Secondary | ICD-10-CM | POA: Insufficient documentation

## 2021-01-18 DIAGNOSIS — D122 Benign neoplasm of ascending colon: Secondary | ICD-10-CM | POA: Diagnosis not present

## 2021-01-18 DIAGNOSIS — D123 Benign neoplasm of transverse colon: Secondary | ICD-10-CM | POA: Diagnosis not present

## 2021-01-18 DIAGNOSIS — Z87891 Personal history of nicotine dependence: Secondary | ICD-10-CM | POA: Insufficient documentation

## 2021-01-18 DIAGNOSIS — Z7984 Long term (current) use of oral hypoglycemic drugs: Secondary | ICD-10-CM | POA: Insufficient documentation

## 2021-01-18 DIAGNOSIS — Z86010 Personal history of colonic polyps: Secondary | ICD-10-CM | POA: Diagnosis not present

## 2021-01-18 DIAGNOSIS — Z09 Encounter for follow-up examination after completed treatment for conditions other than malignant neoplasm: Secondary | ICD-10-CM | POA: Diagnosis present

## 2021-01-18 HISTORY — PX: POLYPECTOMY: SHX5525

## 2021-01-18 HISTORY — PX: COLONOSCOPY: SHX5424

## 2021-01-18 LAB — GLUCOSE, CAPILLARY: Glucose-Capillary: 146 mg/dL — ABNORMAL HIGH (ref 70–99)

## 2021-01-18 SURGERY — COLONOSCOPY
Anesthesia: Moderate Sedation

## 2021-01-18 MED ORDER — MEPERIDINE HCL 100 MG/ML IJ SOLN
INTRAMUSCULAR | Status: DC | PRN
  Administered 2021-01-18: 40 mg via INTRAVENOUS
  Administered 2021-01-18: 10 mg via INTRAVENOUS

## 2021-01-18 MED ORDER — SODIUM CHLORIDE 0.9 % IV SOLN
INTRAVENOUS | Status: DC
  Administered 2021-01-18: 1000 mL via INTRAVENOUS

## 2021-01-18 MED ORDER — ONDANSETRON HCL 4 MG/2ML IJ SOLN
INTRAMUSCULAR | Status: DC | PRN
  Administered 2021-01-18: 4 mg via INTRAVENOUS

## 2021-01-18 MED ORDER — MIDAZOLAM HCL 5 MG/5ML IJ SOLN
INTRAMUSCULAR | Status: DC | PRN
  Administered 2021-01-18: 1 mg via INTRAVENOUS
  Administered 2021-01-18: 2 mg via INTRAVENOUS
  Administered 2021-01-18 (×2): 1 mg via INTRAVENOUS
  Administered 2021-01-18: 2 mg via INTRAVENOUS
  Administered 2021-01-18 (×2): 1 mg via INTRAVENOUS

## 2021-01-18 MED ORDER — MIDAZOLAM HCL 5 MG/5ML IJ SOLN
INTRAMUSCULAR | Status: AC
  Filled 2021-01-18: qty 10

## 2021-01-18 MED ORDER — MEPERIDINE HCL 50 MG/ML IJ SOLN
INTRAMUSCULAR | Status: AC
  Filled 2021-01-18: qty 1

## 2021-01-18 MED ORDER — ONDANSETRON HCL 4 MG/2ML IJ SOLN
INTRAMUSCULAR | Status: AC
  Filled 2021-01-18: qty 2

## 2021-01-18 MED ORDER — STERILE WATER FOR IRRIGATION IR SOLN
Status: DC | PRN
  Administered 2021-01-18: 1.5 mL

## 2021-01-18 NOTE — Op Note (Signed)
[facility] Patient Name: Jeanette Aguirre Procedure Date: 01/18/2021 7:12 AM MRN: [id number] Date of Birth: 04-Aug-1961 Attending MD: Norvel Richards , MD CSN: [id number] Age: 60 Admit Type: Outpatient Procedure:                Colonoscopy Indications:              High risk colon cancer surveillance: Personal                            history of colonic polyps Providers:                Norvel Richards, MD, Caprice Kluver, Randa Spike, Technician Referring MD:              Medicines:                Meperidine 50 mg IV Complications:            No immediate complications. Estimated Blood Loss:     Estimated blood loss was minimal. Procedure:                Pre-Anesthesia Assessment:                           - Prior to the procedure, a History and Physical                            was performed, and patient medications and                            allergies were reviewed. The patient's tolerance of                            previous anesthesia was also reviewed. The risks                            and benefits of the procedure and the sedation                            options and risks were discussed with the patient.                            All questions were answered, and informed consent                            was obtained. Prior Anticoagulants: The patient has                            taken no previous anticoagulant or antiplatelet                            agents. ASA Grade Assessment: II - A patient with  mild systemic disease. After reviewing the risks                            and benefits, the patient was deemed in                            satisfactory condition to undergo the procedure.                           After obtaining informed consent, the colonoscope                            was passed under direct vision. Throughout the                            procedure, the patient's blood  pressure, pulse, and                            oxygen saturations were monitored continuously. The                            [id number] scope was introduced through                            the anus and advanced to the the cecum, identified                            by appendiceal orifice and ileocecal valve. The                            colonoscopy was performed without difficulty. The                            patient tolerated the procedure well. The entire                            colon was well visualized. Scope In: 7:43:49 AM Scope Out: 8:11:54 AM Scope Withdrawal Time: 0 hours 11 minutes 46 seconds  Total Procedure Duration: 0 hours 28 minutes 5 seconds  Findings:      Five sessile polyps were found in the splenic flexure and ascending       colon. The polyps were 4 to 6 mm in size. These polyps were removed with       a cold snare. Resection and retrieval were complete. Estimated blood       loss was minimal.      The exam was otherwise without abnormality on direct and retroflexion       views. Impression:               - Five 4 to 6 mm polyps at the splenic flexure and                            in the ascending colon, removed with a cold snare.  Resected and retrieved.                           - The examination was otherwise normal on direct                            and retroflexion views. Moderate Sedation:      Moderate (conscious) sedation was administered by the endoscopy nurse       and supervised by the endoscopist. The following parameters were       monitored: oxygen saturation, heart rate, blood pressure, respiratory       rate, EKG, adequacy of pulmonary ventilation, and response to care.       Total physician intraservice time was 34 minutes. Recommendation:           - Patient has a contact number available for                            emergencies. The signs and symptoms of potential                             delayed complications were discussed with the                            patient. Return to normal activities tomorrow.                            Written discharge instructions were provided to the                            patient.                           - Advance diet as tolerated.                           - Continue present medications. Further                            recommendations to follow pending review of                            pathology report Procedure Code(s):        --- Professional ---                           [phone], Colonoscopy, flexible; with removal of                            tumor(s), polyp(s), or other lesion(s) by snare                            technique                           99153, Moderate sedation; each additional 15  minutes intraservice time                           G0500, Moderate sedation services provided by the                            same physician or other qualified health care                            professional performing a gastrointestinal                            endoscopic service that sedation supports,                            requiring the presence of an independent trained                            observer to assist in the monitoring of the                            patient's level of consciousness and physiological                            status; initial 15 minutes of intra-service time;                            patient age 22 years or older (additional time may                            be reported with [phone], as appropriate) Diagnosis Code(s):        --- Professional ---                           Z86.010, Personal history of colonic polyps                           K63.5, Polyp of colon CPT copyright 2019 American Medical Association. All rights reserved. The codes documented in this report are preliminary and upon coder review may  be revised to meet current compliance  requirements. Cristopher Estimable. Jaskirat Schwieger, MD Norvel Richards, MD 01/18/2021 8:24:34 AM This report has been signed electronically. Number of Addenda: 0

## 2021-01-18 NOTE — H&P (Signed)
@LOGO @   Primary Care Physician:  Redmond School, MD Primary Gastroenterologist:  Dr. Gala Romney  Pre-Procedure History & Physical: HPI:  Jeanette Aguirre is a 60 y.o. female here for surveillance colonoscopy.  History of multiple colonic adenomas removed 2018.  Past Medical History:  Diagnosis Date  . Anxiety   . Depression   . Diabetes mellitus without complication ([place])   . Family history of adverse reaction to anesthesia   . Gastroparesis   . GERD (gastroesophageal reflux disease)   . Hyperlipemia   . Pneumonia   . PONV (postoperative nausea and vomiting)     Past Surgical History:  Procedure Laterality Date  . CHOLECYSTECTOMY    . COLONOSCOPY N/A 09/04/2017   Dr. Gala Romney: multiple tubular adenomas removed, diverticulosis. next TCS in 3 years.   . ESOPHAGOGASTRODUODENOSCOPY N/A 04/09/2018   Dr. Gala Romney: inflammation of esophagus c/w acid reflux.   Marland Kitchen LIPOMA EXCISION Right 03/07/2020   Procedure: EXCISION LIPOMA, THIGH;  Surgeon: Aviva Signs, MD;  Location: [facility];  Service: General;  Laterality: Right;  . TONSILLECTOMY     and adenoids  . TUBAL LIGATION    . TYMPANOSTOMY TUBE PLACEMENT      Prior to Admission medications   Medication Sig Start Date End Date Taking? Authorizing Provider  atorvastatin (LIPITOR) 20 MG tablet Take 20 mg by mouth at bedtime.    Yes [provider]  FLUoxetine (PROZAC) 40 MG capsule Take 40 mg by mouth in the morning.  11/21/19  Yes [provider]  glimepiride (AMARYL) 4 MG tablet Take 4 mg by mouth 2 (two) times daily.    Yes [provider]  JARDIANCE 25 MG TABS tablet Take 25 mg by mouth every morning. 11/12/20  Yes [provider]  loratadine (CLARITIN) 10 MG tablet Take 10 mg by mouth daily as needed for allergies.   Yes [provider]  pantoprazole (PROTONIX) 40 MG tablet TAKE 1 TABLET BY MOUTH ONCE DAILY 30 MINUTES BEFORE BREAKFAST Patient taking differently: Take 40 mg by mouth daily. [place] 04/23/20  Yes Mahala Menghini, PA-C  polyethylene glycol-electrolytes (TRILYTE) 420 g solution Take 4,000 mLs by mouth as directed. 11/21/20  Yes Tarnisha Kachmar, Cristopher Estimable, MD  sitaGLIPtin-metformin (JANUMET) 50-1000 MG tablet Take 1 tablet by mouth 2 (two) times daily with a meal.   Yes [provider]    Allergies as of 11/21/2020 - Review Complete 11/21/2020  Allergen Reaction Noted  . Betadine [povidone iodine] Swelling 10/01/2016  . Ciprofloxacin Nausea Only 11/27/2017  . Lisinopril Swelling 08/23/2018    Family History  Problem Relation Age of Onset  . Diabetes Mother   . Peripheral vascular disease Mother   . Diabetes Father   . Colitis Daughter 17       lymphocytic colitis  . Colon cancer Neg Hx     Social History   Socioeconomic History  . Marital status: Divorced    Spouse name: Not on file  . Number of children: Not on file  . Years of education: Not on file  . Highest education level: Not on file  Occupational History  . Not on file  Tobacco Use  . Smoking status: Former Smoker    Packs/day: 1.00    Years: 15.00    Pack years: 15.00    Types: Cigarettes    Quit date: 09/22/2016    Years since quitting: 4.3  . Smokeless tobacco: Never Used  Vaping Use  . Vaping Use: Never used  Substance and Sexual Activity  . Alcohol use: Not Currently    Comment: very little  . Drug use: No  . Sexual activity: Yes  Other Topics Concern  . Not on file  Social History Narrative  . Not on file   Social Determinants of Health   Financial Resource Strain: Not on file  Food Insecurity: Not on file  Transportation Needs: Not on file  Physical Activity: Not on file  Stress: Not on file  Social Connections: Not on file  Intimate Partner Violence: Not on file    Review of Systems: See HPI, otherwise negative ROS  Physical Exam: BP 134/79   Pulse 70   Temp 98.5 F (36.9 C) (Oral)   Resp 11   Ht 5\' 6"  (1.676 m)   Wt 113.4 kg   LMP 03/03/2012   SpO2 95%    BMI 40.35 kg/m  General:   Alert,  Well-developed, well-nourished, pleasant and cooperative in NAD Neck:  Supple; no masses or thyromegaly. No significant cervical adenopathy. Lungs:  Clear throughout to auscultation.   No wheezes, crackles, or rhonchi. No acute distress. Heart:  Regular rate and rhythm; no murmurs, clicks, rubs,  or gallops. Abdomen: Non-distended, normal bowel sounds.  Soft and nontender without appreciable mass or hepatosplenomegaly.  Pulses:  Normal pulses noted. Extremities:  Without clubbing or edema.  Impression/Plan: 60 year old lady here for surveillance colonoscopy.  History multiple colonic adenomas removed in 2018. The risks, benefits, limitations, alternatives and imponderables have been reviewed with the patient. Questions have been answered. All parties are agreeable.      Notice: This dictation was prepared with Dragon dictation along with smaller phrase technology. Any transcriptional errors that result from this process are unintentional and may not be corrected upon review.

## 2021-01-18 NOTE — Discharge Instructions (Signed)
Colonoscopy Discharge Instructions  Read the instructions outlined below and refer to this sheet in the next few weeks. These discharge instructions provide you with general information on caring for yourself after you leave the hospital. Your doctor may also give you specific instructions. While your treatment has been planned according to the most current medical practices available, unavoidable complications occasionally occur. If you have any problems or questions after discharge, call Dr. Gala Romney at [phone]. ACTIVITY  You may resume your regular activity, but move at a slower pace for the next 24 hours.   Take frequent rest periods for the next 24 hours.   Walking will help get rid of the air and reduce the bloated feeling in your belly (abdomen).   No driving for 24 hours (because of the medicine (anesthesia) used during the test).    Do not sign any important legal documents or operate any machinery for 24 hours (because of the anesthesia used during the test).  NUTRITION  Drink plenty of fluids.   You may resume your normal diet as instructed by your doctor.   Begin with a light meal and progress to your normal diet. Heavy or fried foods are harder to digest and may make you feel sick to your stomach (nauseated).   Avoid alcoholic beverages for 24 hours or as instructed.  MEDICATIONS  You may resume your normal medications unless your doctor tells you otherwise.  WHAT YOU CAN EXPECT TODAY  Some feelings of bloating in the abdomen.   Passage of more gas than usual.   Spotting of blood in your stool or on the toilet paper.  IF YOU HAD POLYPS REMOVED DURING THE COLONOSCOPY:  No aspirin products for 7 days or as instructed.   No alcohol for 7 days or as instructed.   Eat a soft diet for the next 24 hours.  FINDING OUT THE RESULTS OF YOUR TEST Not all test results are available during your visit. If your test results are not back during the visit, make an appointment  with your caregiver to find out the results. Do not assume everything is normal if you have not heard from your caregiver or the medical facility. It is important for you to follow up on all of your test results.  SEEK IMMEDIATE MEDICAL ATTENTION IF:  You have more than a spotting of blood in your stool.   Your belly is swollen (abdominal distention).   You are nauseated or vomiting.   You have a temperature over 101.   You have abdominal pain or discomfort that is severe or gets worse throughout the day.   5 polyps removed in your colon today  Further recommendations to follow pending review of pathology report  At patient request, I called Lucrezia Europe at [phone] results and recommendations   Colon Polyps  Colon polyps are tissue growths inside the colon, which is part of the large intestine. They are one of the types of polyps that can grow in the body. A polyp may be a round bump or a mushroom-shaped growth. You could have one polyp or more than one. Most colon polyps are noncancerous (benign). However, some colon polyps can become cancerous over time. Finding and removing the polyps early can help prevent this. What are the causes? The exact cause of colon polyps is not known. What increases the risk? The following factors may make you more likely to develop this condition:  Having a family history of colorectal cancer or colon polyps.  Being  older than 58 years of age.  Being younger than 58 years of age and having a significant family history of colorectal cancer or colon polyps or a genetic condition that puts you at higher risk of getting colon polyps.  Having inflammatory bowel disease, such as ulcerative colitis or Crohn's disease.  Having certain conditions passed from parent to child (hereditary conditions), such as: ? Familial adenomatous polyposis (FAP). ? Lynch syndrome. ? Turcot syndrome. ? Peutz-Jeghers syndrome. ? MUTYH-associated  polyposis (MAP).  Being overweight.  Certain lifestyle factors. These include smoking cigarettes, drinking too much alcohol, not getting enough exercise, and eating a diet that is high in fat and red meat and low in fiber.  Having had childhood cancer that was treated with radiation of the abdomen. What are the signs or symptoms? Many times, there are no symptoms. If you have symptoms, they may include:  Blood coming from the rectum during a bowel movement.  Blood in the stool (feces). The blood may be bright red or very dark in color.  Pain in the abdomen.  A change in bowel habits, such as constipation or diarrhea. How is this diagnosed? This condition is diagnosed with a colonoscopy. This is a procedure in which a lighted, flexible scope is inserted into the opening between the buttocks (anus) and then passed into the colon to examine the area. Polyps are sometimes found when a colonoscopy is done as part of routine cancer screening tests. How is this treated? This condition is treated by removing any polyps that are found. Most polyps can be removed during a colonoscopy. Those polyps will then be tested for cancer. Additional treatment may be needed depending on the results of testing. Follow these instructions at home: Eating and drinking  Eat foods that are high in fiber, such as fruits, vegetables, and whole grains.  Eat foods that are high in calcium and vitamin D, such as milk, cheese, yogurt, eggs, liver, fish, and broccoli.  Limit foods that are high in fat, such as fried foods and desserts.  Limit the amount of red meat, precooked or cured meat, or other processed meat that you eat, such as hot dogs, sausages, bacon, or meat loaves.  Limit sugary drinks.   Lifestyle  Maintain a healthy weight, or lose weight if recommended by your health care provider.  Exercise every day or as told by your health care provider.  Do not use any products that contain nicotine or  tobacco, such as cigarettes, e-cigarettes, and chewing tobacco. If you need help quitting, ask your health care provider.  Do not drink alcohol if: ? Your health care provider tells you not to drink. ? You are pregnant, may be pregnant, or are planning to become pregnant.  If you drink alcohol: ? Limit how much you use to:  0-1 drink a day for women.  0-2 drinks a day for men. ? Know how much alcohol is in your drink. In the [place] one drink equals one 12 oz bottle of beer (355 mL), one 5 oz glass of wine (148 mL), or one 1 oz glass of hard liquor (44 mL). General instructions  Take over-the-counter and prescription medicines only as told by your health care provider.  Keep all follow-up visits. This is important. This includes having regularly scheduled colonoscopies. Talk to your health care provider about when you need a colonoscopy. Contact a health care provider if:  You have new or worsening bleeding during a bowel movement.  You have new or  increased blood in your stool.  You have a change in bowel habits.  You lose weight for no known reason. Summary  Colon polyps are tissue growths inside the colon, which is part of the large intestine. They are one type of polyp that can grow in the body.  Most colon polyps are noncancerous (benign), but some can become cancerous over time.  This condition is diagnosed with a colonoscopy.  This condition is treated by removing any polyps that are found. Most polyps can be removed during a colonoscopy. This information is not intended to replace advice given to you by your health care provider. Make sure you discuss any questions you have with your health care provider. Document Revised: 12/28/2019 Document Reviewed: 12/28/2019 Elsevier Patient Education  2021 [employer].

## 2021-01-21 LAB — SURGICAL PATHOLOGY

## 2021-01-22 ENCOUNTER — Encounter: Payer: Self-pay | Admitting: Internal Medicine

## 2021-01-23 ENCOUNTER — Encounter (HOSPITAL_COMMUNITY): Payer: Self-pay | Admitting: Internal Medicine

## 2021-02-22 ENCOUNTER — Encounter: Payer: Self-pay | Admitting: Gastroenterology

## 2021-02-28 ENCOUNTER — Encounter: Payer: Self-pay | Admitting: *Deleted

## 2021-02-28 ENCOUNTER — Other Ambulatory Visit: Payer: Self-pay | Admitting: *Deleted

## 2021-02-28 DIAGNOSIS — D649 Anemia, unspecified: Secondary | ICD-10-CM

## 2021-02-28 DIAGNOSIS — R748 Abnormal levels of other serum enzymes: Secondary | ICD-10-CM

## 2021-02-28 DIAGNOSIS — R790 Abnormal level of blood mineral: Secondary | ICD-10-CM

## 2021-05-29 ENCOUNTER — Other Ambulatory Visit (HOSPITAL_COMMUNITY)
Admission: RE | Admit: 2021-05-29 | Discharge: 2021-05-29 | Disposition: A | Discharge status: Home / Self Care | Payer: No Typology Code available for payment source | Source: Ambulatory Visit | Attending: Gastroenterology | Admitting: Gastroenterology

## 2021-05-29 DIAGNOSIS — D649 Anemia, unspecified: Secondary | ICD-10-CM | POA: Insufficient documentation

## 2021-05-29 DIAGNOSIS — R790 Abnormal level of blood mineral: Secondary | ICD-10-CM | POA: Insufficient documentation

## 2021-05-29 DIAGNOSIS — R748 Abnormal levels of other serum enzymes: Secondary | ICD-10-CM | POA: Diagnosis present

## 2021-05-29 LAB — CBC WITH DIFFERENTIAL/PLATELET
Abs Immature Granulocytes: 0.03 10*3/uL (ref 0.00–0.07)
Basophils Absolute: 0 10*3/uL (ref 0.0–0.1)
Basophils Relative: 0 %
Eosinophils Absolute: 0.2 10*3/uL (ref 0.0–0.5)
Eosinophils Relative: 2 %
HCT: 37 % (ref 36.0–46.0)
Hemoglobin: 11.5 g/dL — ABNORMAL LOW (ref 12.0–15.0)
Immature Granulocytes: 0 %
Lymphocytes Relative: 35 %
Lymphs Abs: 3.2 10*3/uL (ref 0.7–4.0)
MCH: 26 pg (ref 26.0–34.0)
MCHC: 31.1 g/dL (ref 30.0–36.0)
MCV: 83.7 fL (ref 80.0–100.0)
Monocytes Absolute: 0.4 10*3/uL (ref 0.1–1.0)
Monocytes Relative: 4 %
Neutro Abs: 5.4 10*3/uL (ref 1.7–7.7)
Neutrophils Relative %: 59 %
Platelets: 350 10*3/uL (ref 150–400)
RBC: 4.42 MIL/uL (ref 3.87–5.11)
RDW: 14.1 % (ref 11.5–15.5)
WBC: 9.2 10*3/uL (ref 4.0–10.5)
nRBC: 0 % (ref 0.0–0.2)

## 2021-05-29 LAB — IRON AND TIBC
Iron: 27 ug/dL — ABNORMAL LOW (ref 28–170)
Saturation Ratios: 7 % — ABNORMAL LOW (ref 10.4–31.8)
TIBC: 398 ug/dL (ref 250–450)
UIBC: 371 ug/dL

## 2021-05-29 LAB — FERRITIN: Ferritin: 11 ng/mL (ref 11–307)

## 2021-05-29 MED ORDER — PROMETHAZINE HCL 25 MG PO TABS: ORAL_TABLET | ORAL | 0 refills | Status: DC

## 2021-05-29 MED ORDER — ONDANSETRON 4 MG PO TBDP: 4.0000 mg | ORAL_TABLET | Freq: Four times a day (QID) | ORAL | 0 refills | Status: DC | PRN

## 2021-06-05 ENCOUNTER — Telehealth: Payer: Self-pay | Admitting: Internal Medicine

## 2021-06-05 ENCOUNTER — Other Ambulatory Visit: Payer: Self-pay | Admitting: Gastroenterology

## 2021-06-05 DIAGNOSIS — R112 Nausea with vomiting, unspecified: Secondary | ICD-10-CM

## 2021-06-05 MED ORDER — PROMETHAZINE HCL 25 MG PO TABS: ORAL_TABLET | ORAL | 0 refills | Status: DC

## 2021-06-05 NOTE — Telephone Encounter (Signed)
Pt was made aware and verbalized understanding.  

## 2021-06-05 NOTE — Telephone Encounter (Signed)
Pt's daughter came in this morning with pt in the car stating that the pt was having another gastroparesis episode. Pt's symptoms were nausea/vomiting, and reflux. Spoke with Cyril Mourning that was here at the time and she suggested that the pt take Zofran every 6 hrs and phenergan as needed in between, as well as, starting a clear liquid diet for the next 24 hrs. Daughter is going to call in the morning with an update on the pt. Magda Paganini do you have any other suggestions for the pt?

## 2021-06-05 NOTE — Telephone Encounter (Signed)
Communication noted. Phenergan was refilled today as patient had run out.

## 2021-06-05 NOTE — Telephone Encounter (Signed)
She should keep diary (food/symptoms) as mentioned during mychart message last week.  Only additional instructions, if she cannot keep liquid down to stay hydrated, I would advise to go to er.

## 2021-06-06 MED ORDER — SUCRALFATE 1 GM/10ML PO SUSP: 1.0000 g | Freq: Three times a day (TID) | ORAL | 0 refills | Status: DC

## 2021-06-06 MED ORDER — PANTOPRAZOLE SODIUM 40 MG PO TBEC: 40.0000 mg | DELAYED_RELEASE_TABLET | Freq: Two times a day (BID) | ORAL | 0 refills | Status: DC

## 2021-06-06 NOTE — Telephone Encounter (Signed)
Spoke with pt and she is ok with the recommendations. Pt is aware medications were called in and pt will keep upcoming appt.

## 2021-06-06 NOTE — Telephone Encounter (Signed)
She may have esophagitis due to frequent vomiting.   Let's increase her pantoprazole to twice daily before a meal for one month. I can send in RX to cover dosage change.  Also can offer her Carafate before meals and at bedtime to coat esophagus until things settle down.  Keep upcoming ov.

## 2021-06-06 NOTE — Addendum Note (Signed)
Addended by: Mahala Menghini on: 06/06/2021 01:38 PM   Modules accepted: Orders

## 2021-06-06 NOTE — Telephone Encounter (Signed)
Pt called this morning stating that she is doing better but that it feels like something is cutting her when she swallows in the middle of her esophagus. The nausea/vomiting is much better today than yesterday. Pt ate scrambled eggs and one piece of bacon this morning and so far has been able to keep them down.

## 2021-06-18 ENCOUNTER — Other Ambulatory Visit: Payer: Self-pay | Admitting: Gastroenterology

## 2021-07-03 ENCOUNTER — Other Ambulatory Visit: Payer: Self-pay

## 2021-07-03 ENCOUNTER — Telehealth: Payer: Self-pay | Admitting: *Deleted

## 2021-07-03 ENCOUNTER — Encounter: Payer: Self-pay | Admitting: Gastroenterology

## 2021-07-03 ENCOUNTER — Ambulatory Visit (INDEPENDENT_AMBULATORY_CARE_PROVIDER_SITE_OTHER): Payer: No Typology Code available for payment source | Admitting: Gastroenterology

## 2021-07-03 VITALS — BP 135/74 | HR 73 | Temp 98.2°F | Ht 66.0 in | Wt 247.8 lb

## 2021-07-03 DIAGNOSIS — R112 Nausea with vomiting, unspecified: Secondary | ICD-10-CM

## 2021-07-03 DIAGNOSIS — K219 Gastro-esophageal reflux disease without esophagitis: Secondary | ICD-10-CM

## 2021-07-03 DIAGNOSIS — K529 Noninfective gastroenteritis and colitis, unspecified: Secondary | ICD-10-CM | POA: Diagnosis not present

## 2021-07-03 DIAGNOSIS — D509 Iron deficiency anemia, unspecified: Secondary | ICD-10-CM | POA: Diagnosis not present

## 2021-07-03 NOTE — Patient Instructions (Signed)
Please continue to document episodes but note if you were having increased stressors. Upper endoscopy as scheduled. See separate instructions.  Please have labs done at [place] or at [facility]. Take your orders with you.  Continue carafate four times daily as needed. Continue pantoprazole once daily for now. I will have our nurse look into why you have not been able to get the twice daily dosing.

## 2021-07-03 NOTE — Telephone Encounter (Signed)
Called pt. She has been scheduled for EGD with propofol asa 2 Dr. Gala Romney on 11/7 at 11:15a,. Aware will send instructions via [place].

## 2021-07-03 NOTE — Progress Notes (Signed)
Primary Care Physician: Redmond School, MD  Primary Gastroenterologist:  Garfield Cornea, MD   Chief Complaint  Patient presents with   Emesis    Dry heaves   Diarrhea    HPI: Jeanette Aguirre is a 60 y.o. female here for follow-up.  Patient was last seen in March.  She has a history of hepatomegaly with fatty liver, anemia, B12 deficiency, GERD, possible mild gastroparesis.  Patient presents for follow-up today.  Completed a colonoscopy in April 2022.  Several tubular adenomas removed, next colonoscopy in 3 years.  Since her colonoscopy her episodes of nausea/vomiting have become more frequent.  She has been keeping a journal.  She has had episodes 3 times a May, twice in July, once in August, twice in September.  Last episode was severe, she almost went to the emergency department.  In the past she had episodes about twice per year.  She did not keep food journal during this time.  Over the past couple of weeks she has been journaling her food intake which has consisted of fairly fatty/greasy items. She has had no episodes with this diet. She has noted with increased stress with her daughter, she will typically have abdominal pain and nausea. At least one of the episodes occurred related to stress with daughter, recently moved back to the area. No hematemesis. No heartburn on pantoprazole once daily. No dysphagia.  Symptoms associated with burning type pain in the epigastrium, radiates into the throat area.  Since symptoms have been progressive she has been taking Carafate up to 4 times daily but she is remembering, has been helpful.  She denies any melena or rectal bleeding.  She is concerned about rash she has developed over her lower extremities from the knee down, noted last Thursday.  Recently came back from a trip to the [place].  Has had some minor lower extremity swelling.  Labs from May 29, 2021: Hemoglobin 11.5, hematocrit 37, MCV 83.7, platelets 350,000, iron 27, TIBC 398, iron  saturation 7%, ferritin 11.   Down about 10 pounds in the past one year.   EGD July 2019, inflammation of the esophagus consistent with acid reflux.  Celiac serologies and H. pylori antibody negative in March 2019. GES 01/2019: normal emptying at 1 and 2 hours, but overal delayed.  Colonoscopy April 2022, Five 4 to 6 mm polyps at the splenic flexure and in the ascending colon, removed. Tubular adenomas.  Next colonoscopy in 3 years.   CT abdomen pelvis with contrast December 2021 for abdominal pain ordered by PCP.  Mild fatty liver.  Moderate stool throughout the colon.  Normal appendix.  Current Outpatient Medications  Medication Sig Dispense Refill   atorvastatin (LIPITOR) 20 MG tablet Take 20 mg by mouth at bedtime.      FLUoxetine (PROZAC) 40 MG capsule Take 40 mg by mouth in the morning.      glimepiride (AMARYL) 4 MG tablet Take 4 mg by mouth 2 (two) times daily.      JARDIANCE 25 MG TABS tablet Take 25 mg by mouth every morning.     loratadine (CLARITIN) 10 MG tablet Take 10 mg by mouth daily as needed for allergies.     ondansetron (ZOFRAN ODT) 4 MG disintegrating tablet Take 1 tablet (4 mg total) by mouth every 6 (six) hours as needed for nausea or vomiting. 60 tablet 0   pantoprazole (PROTONIX) 40 MG tablet Take 1 tablet (40 mg total) by mouth 2 (two) times daily  before a meal. For one month, then go back to once daily. 60 tablet 0   pantoprazole (PROTONIX) 40 MG tablet TAKE 1 TABLET BY MOUTH ONCE DAILY 30  MINUTES  BEFORE  BREAKFAST 90 tablet 3   promethazine (PHENERGAN) 25 MG tablet Take 1/2 to 1 tablet every 6 hours as needed for nausea/vomiting 30 tablet 0   sitaGLIPtin-metformin (JANUMET) 50-1000 MG tablet Take 1 tablet by mouth 2 (two) times daily with a meal.     sucralfate (CARAFATE) 1 GM/10ML suspension Take 10 mLs (1 g total) by mouth 4 (four) times daily -  with meals and at bedtime for 10 days. 414 mL 0   No current facility-administered medications for this visit.     Allergies as of 07/03/2021 - Review Complete 07/03/2021  Allergen Reaction Noted   Betadine [povidone iodine] Swelling 10/01/2016   Ciprofloxacin Nausea Only 11/27/2017   Lisinopril Swelling 08/23/2018   Past Medical History:  Diagnosis Date   Anxiety    Depression    Diabetes mellitus without complication (HCC)    Family history of adverse reaction to anesthesia    Gastroparesis    GERD (gastroesophageal reflux disease)    Hyperlipemia    Pneumonia    PONV (postoperative nausea and vomiting)    Past Surgical History:  Procedure Laterality Date   CHOLECYSTECTOMY     COLONOSCOPY N/A 09/04/2017   Dr. Gala Romney: multiple tubular adenomas removed, diverticulosis. next TCS in 3 years.    COLONOSCOPY N/A 01/18/2021   Procedure: COLONOSCOPY;  Surgeon: Daneil Dolin, MD;  Location: [facility];  Service: Endoscopy;  Laterality: N/A;  AM (diabetic)   ESOPHAGOGASTRODUODENOSCOPY N/A 04/09/2018   Dr. Gala Romney: inflammation of esophagus c/w acid reflux.    LIPOMA EXCISION Right 03/07/2020   Procedure: EXCISION LIPOMA, THIGH;  Surgeon: Aviva Signs, MD;  Location: [facility];  Service: General;  Laterality: Right;   POLYPECTOMY  01/18/2021   Procedure: POLYPECTOMY;  Surgeon: Daneil Dolin, MD;  Location: [facility];  Service: Endoscopy;;   TONSILLECTOMY     and adenoids   TUBAL LIGATION     TYMPANOSTOMY TUBE PLACEMENT     Family History  Problem Relation Age of Onset   Diabetes Mother    Peripheral vascular disease Mother    Diabetes Father    Colitis Daughter 66       lymphocytic colitis   Colon cancer Neg Hx    Social History   Tobacco Use   Smoking status: Former    Packs/day: 1.00    Years: 15.00    Pack years: 15.00    Types: Cigarettes    Quit date: 09/22/2016    Years since quitting: 4.7   Smokeless tobacco: Never  Vaping Use   Vaping Use: Never used  Substance Use Topics   Alcohol use: Not Currently    Comment: very little   Drug use: No    ROS:  General:  Negative for anorexia, unintentional weight loss, fever, chills, fatigue, weakness. ENT: Negative for hoarseness, difficulty swallowing , nasal congestion. CV: Negative for chest pain, angina, palpitations, dyspnea on exertion, peripheral edema.  Respiratory: Negative for dyspnea at rest, dyspnea on exertion, cough, sputum, wheezing.  GI: See history of present illness. GU:  Negative for dysuria, hematuria, urinary incontinence, urinary frequency, nocturnal urination.  Endo: Negative for unusual weight change.    Physical Examination:   BP 135/74   Pulse 73   Temp 98.2 F (36.8 C)   Ht 5\' 6"  (  1.676 m)   Wt 247 lb 12.8 oz (112.4 kg)   LMP 03/03/2012   BMI 40.00 kg/m   General: Well-nourished, well-developed in no acute distress.  Eyes: No icterus. Mouth: masked Lungs: Clear to auscultation bilaterally.  Heart: Regular rate and rhythm, no murmurs rubs or gallops.  Abdomen: Bowel sounds are normal, nontender, nondistended, no hepatosplenomegaly or masses, no abdominal bruits or hernia , no rebound or guarding.   Extremities: No lower extremity edema. No clubbing or deformities. Neuro: Alert and oriented x 4   Skin: Warm and dry, no jaundice.   Psych: Alert and cooperative, normal mood and affect.  Labs:  Lab Results  Component Value Date   WBC 9.2 05/29/2021   HGB 11.5 (L) 05/29/2021   HCT 37.0 05/29/2021   MCV 83.7 05/29/2021   PLT 350 05/29/2021   Lab Results  Component Value Date   CREATININE 0.80 09/05/2020   BUN 8 03/10/2020   NA 139 03/10/2020   K 3.4 (L) 03/10/2020   CL 101 03/10/2020   CO2 27 03/10/2020   Lab Results  Component Value Date   IRON 27 (L) 05/29/2021   TIBC 398 05/29/2021   FERRITIN 11 05/29/2021   Lab Results  Component Value Date   ALT 14 12/07/2020   AST 16 12/07/2020   ALKPHOS 127 (H) 12/07/2020   BILITOT 0.3 12/07/2020   Lab Results  Component Value Date   VITAMINB12 252 12/07/2020   Lab Results  Component Value Date   FOLATE  6.4 11/25/2018     Imaging Studies: No results found.   Assessment:  Episodic nausea/vomiting associated with upper abdominal pain: Symptoms used to be infrequent, couple episodes per year.  She has had increased episodes 2 to 3/month over the past several months.  No specific food triggers.  Does consume quite a bit of fatty/greasy foods.  Usually well-tolerated.  She has prior gastric emptying study with normal emptying at 1 and 2 hours but overall somewhat delayed.  It is not clear that her current symptoms are related however.  Query symptoms related to reflux, gastritis, ulcer, stress.  In addition she has had trend towards iron deficiency anemia.  We will check for celiac.  Check for alpha gal.  IDA: Downward trend with low serum iron, low normal ferritin, increasing TIBC, hemoglobin 11.5.  Colonoscopy up-to-date.  No overt GI bleeding.   Given progressive nausea and vomiting, consider upper endoscopy.  Fatty liver: Overall weight down 10 pounds in the past 1 year.  Recommend improvement in dietary intake, low-fat foods.  GERD: Typical heartburn symptoms controlled on pantoprazole 40 mg daily.   Plan: Continue to journal symptoms. Upper endoscopy with propofol (failed conscious sedation). ASA II.  I have discussed the risks, alternatives, benefits with regards to but not limited to the risk of reaction to medication, bleeding, infection, perforation and the patient is agreeable to proceed. Written consent to be obtained. Complete labs. Continue Carafate 4 times daily as needed. Continue pantoprazole once daily for now.  We recently tried to increase to twice daily dosing but she has not been able to get that from the pharmacy.  We will check again to see if there is a insurance coverage issue.

## 2021-07-04 ENCOUNTER — Telehealth: Payer: Self-pay

## 2021-07-04 NOTE — Telephone Encounter (Signed)
The pt already had on file with Cover My Meds the Rx Pantoprazole 40 mg tab for once a day. They will not approve both at the same time with the pt taking carafate. I spoke with the pt and she advised me that she had picked up the Rx  for once a day. I advised her 2 Rx's were in the system and the once a day got approved. I advised her to take that along with the carafate. Do you want the pantoprazole for once a day or twice a day. Please advise

## 2021-07-04 NOTE — Telephone Encounter (Signed)
Went into Cover My Meds and the pt's PA for Pantoprazole 40 mg tab has been approved for once a day. The twice a day was the first Rx given, the the once a day. I phoned and advised the pt that it had been approved but she  will check at home later today because she just picked up a Rx of it. I advised her that they will not approve both at the same time plus she is on carafate x 4 a day.  [place] [id number].

## 2021-07-09 NOTE — Telephone Encounter (Signed)
Spoke with patient.  She reports she and her friends went to [employer] last night.  She ordered steak and had 2 bites but could not eat anymore.  States she just was not hungry which is very unusual for her.  Reported some burning sensation in her abdomen at that point.  She had ice cream and later had custard which she tolerated well.  This morning, she woke up with sensation of "lead" in her stomach, reflux, nausea without vomiting, and felt fatigued.  She had to call out of work.  She took all of her usual meds including Protonix 40 mg and also took Phenergan and Carafate.  Currently, she is feeling improved, but still with some fatigue and loose stools which is a chronic issue for her.  Denies nausea, vomiting, or abdominal pain.  As she is feeling better and symptoms are typical of her prior flares for which Magda Paganini has been following her, I will defer additional recommendations and discussion about possibility of SIBO to Neil Crouch, PA-C.   She was advised to continue her current medications.  We did discuss history of delayed gastric emptying and she was provided information on a gastroparesis diet including following a low fat/low fiber diet, eating 4-6 small meals daily, and keeping blood sugars under good control.   Routine to Magda Paganini to address when she returns tomorrow.

## 2021-07-11 LAB — CBC WITH DIFFERENTIAL/PLATELET
Basophils Absolute: 0 10*3/uL (ref 0.0–0.2)
Basos: 0 %
EOS (ABSOLUTE): 0.1 10*3/uL (ref 0.0–0.4)
Eos: 1 %
Hematocrit: 35.7 % (ref 34.0–46.6)
Hemoglobin: 11.1 g/dL (ref 11.1–15.9)
Immature Grans (Abs): 0 10*3/uL (ref 0.0–0.1)
Immature Granulocytes: 0 %
Lymphocytes Absolute: 2.5 10*3/uL (ref 0.7–3.1)
Lymphs: 27 %
MCH: 25.6 pg — ABNORMAL LOW (ref 26.6–33.0)
MCHC: 31.1 g/dL — ABNORMAL LOW (ref 31.5–35.7)
MCV: 82 fL (ref 79–97)
Monocytes Absolute: 0.4 10*3/uL (ref 0.1–0.9)
Monocytes: 4 %
Neutrophils Absolute: 6.2 10*3/uL (ref 1.4–7.0)
Neutrophils: 68 %
Platelets: 339 10*3/uL (ref 150–450)
RBC: 4.33 x10E6/uL (ref 3.77–5.28)
RDW: 14.3 % (ref 11.7–15.4)
WBC: 9.2 10*3/uL (ref 3.4–10.8)

## 2021-07-11 LAB — COMPREHENSIVE METABOLIC PANEL
ALT: 18 IU/L (ref 0–32)
AST: 14 IU/L (ref 0–40)
Albumin/Globulin Ratio: 1.6 (ref 1.2–2.2)
Albumin: 4.4 g/dL (ref 3.8–4.9)
Alkaline Phosphatase: 133 IU/L — ABNORMAL HIGH (ref 44–121)
BUN/Creatinine Ratio: 13 (ref 9–23)
BUN: 11 mg/dL (ref 6–24)
Bilirubin Total: 0.4 mg/dL (ref 0.0–1.2)
CO2: 21 mmol/L (ref 20–29)
Calcium: 9.8 mg/dL (ref 8.7–10.2)
Chloride: 99 mmol/L (ref 96–106)
Creatinine, Ser: 0.86 mg/dL (ref 0.57–1.00)
Globulin, Total: 2.7 g/dL (ref 1.5–4.5)
Glucose: 202 mg/dL — ABNORMAL HIGH (ref 70–99)
Potassium: 4.8 mmol/L (ref 3.5–5.2)
Sodium: 140 mmol/L (ref 134–144)
Total Protein: 7.1 g/dL (ref 6.0–8.5)
eGFR: 78 mL/min/{1.73_m2} (ref >59)

## 2021-07-11 LAB — PROTIME-INR
INR: 1 (ref 0.9–1.2)
Prothrombin Time: 10.1 s (ref 9.1–12.0)

## 2021-07-11 LAB — ALPHA-GAL PANEL
Allergen Lamb IgE: 0.46 kU/L — AB
Beef IgE: 0.14 kU/L — AB
IgE (Immunoglobulin E), Serum: 465 IU/mL (ref 6–495)
O215-IgE Alpha-Gal: <0.1 kU/L
Pork IgE: 0.5 kU/L — AB

## 2021-07-15 ENCOUNTER — Other Ambulatory Visit: Payer: Self-pay

## 2021-07-15 DIAGNOSIS — Z91018 Allergy to other foods: Secondary | ICD-10-CM

## 2021-07-18 NOTE — Telephone Encounter (Signed)
Carfate is not in the same class of drug as the pantoprazole therefore should not affect her coverage.   I increased her pantoprazole to BID which should take precedence over the older once a day approval.   She needs the pantoprazole BID and the Carfate.

## 2021-07-19 NOTE — Telephone Encounter (Signed)
Phoned to [facility] and spoke with the pharmacy dept and was advised the insurance would not pay for pt to take twice a day because the pt had picked up a 90 day supply on Sept 27th. They advised me that after October 31st if the pt needs x2 a day to resubmit to Cover My Meds. I was advised that on Sept 27th the pt@ 8:47 am went on the app or internet and requested for the 90 day supply to be filled when both Rx's was sitting in the system because the Rx from the 15th was in the system too. The Rx for the 15th had not been approved through her insurance and when they made their decision the pt had already checked on the box for the 90 day supply. (She had picked up a 90 day supply June 21st and Sept 27th). This is why the twice a day got cancelled out. There was no way of me knowing the pt had did this.

## 2021-07-22 NOTE — Telephone Encounter (Signed)
Pt was made aware. Informed pt to call us when she starts to run low and we will resubmit for the twice a day on cover my meds. Told pt to not pick up the once a day in the future. Pt verbalized understanding.

## 2021-07-22 NOTE — Telephone Encounter (Signed)
Noted. If patient is still having problems she can just increase to bid with the rx she has at home. Egd next week as planned.

## 2021-07-25 ENCOUNTER — Telehealth: Payer: Self-pay | Admitting: Internal Medicine

## 2021-07-25 NOTE — Telephone Encounter (Signed)
Patient brought in FMLA form to be filled out.  They are on your desk

## 2021-07-29 ENCOUNTER — Telehealth: Payer: Self-pay | Admitting: Internal Medicine

## 2021-07-29 ENCOUNTER — Encounter: Payer: Self-pay | Admitting: *Deleted

## 2021-07-29 NOTE — Telephone Encounter (Signed)
FYI to Neil Crouch PA and Leonia Reeves.

## 2021-07-29 NOTE — Telephone Encounter (Signed)
Called pt. She has been rescheduled to 11/28 at 8:15am. Aware will send new instructions via mychart. Also message sent to endo

## 2021-07-29 NOTE — Telephone Encounter (Signed)
Pt called to cancel her procedure with RMR for this morning due to being sick. She wants to reschedule. [phone]

## 2021-08-19 ENCOUNTER — Encounter (HOSPITAL_COMMUNITY): Payer: Self-pay | Admitting: Internal Medicine

## 2021-08-19 ENCOUNTER — Ambulatory Visit (HOSPITAL_COMMUNITY): Payer: PRIVATE HEALTH INSURANCE | Admitting: Anesthesiology

## 2021-08-19 ENCOUNTER — Encounter (HOSPITAL_COMMUNITY)
Admission: RE | Disposition: A | Discharge status: Home / Self Care | Payer: Self-pay | Source: Ambulatory Visit | Attending: Internal Medicine

## 2021-08-19 ENCOUNTER — Ambulatory Visit (HOSPITAL_COMMUNITY)
Admission: RE | Admit: 2021-08-19 | Discharge: 2021-08-19 | Disposition: A | Discharge status: Home / Self Care | Payer: PRIVATE HEALTH INSURANCE | Source: Ambulatory Visit | Attending: Internal Medicine | Admitting: Internal Medicine

## 2021-08-19 ENCOUNTER — Other Ambulatory Visit: Payer: Self-pay

## 2021-08-19 DIAGNOSIS — Z87891 Personal history of nicotine dependence: Secondary | ICD-10-CM | POA: Diagnosis not present

## 2021-08-19 DIAGNOSIS — Z6841 Body Mass Index (BMI) 40.0 and over, adult: Secondary | ICD-10-CM | POA: Insufficient documentation

## 2021-08-19 DIAGNOSIS — R112 Nausea with vomiting, unspecified: Secondary | ICD-10-CM | POA: Insufficient documentation

## 2021-08-19 DIAGNOSIS — Z79899 Other long term (current) drug therapy: Secondary | ICD-10-CM | POA: Insufficient documentation

## 2021-08-19 DIAGNOSIS — F32A Depression, unspecified: Secondary | ICD-10-CM | POA: Insufficient documentation

## 2021-08-19 DIAGNOSIS — R768 Other specified abnormal immunological findings in serum: Secondary | ICD-10-CM | POA: Insufficient documentation

## 2021-08-19 DIAGNOSIS — F419 Anxiety disorder, unspecified: Secondary | ICD-10-CM | POA: Diagnosis not present

## 2021-08-19 DIAGNOSIS — R1013 Epigastric pain: Secondary | ICD-10-CM | POA: Insufficient documentation

## 2021-08-19 DIAGNOSIS — K219 Gastro-esophageal reflux disease without esophagitis: Secondary | ICD-10-CM | POA: Diagnosis not present

## 2021-08-19 HISTORY — PX: ESOPHAGOGASTRODUODENOSCOPY (EGD) WITH PROPOFOL: SHX5813

## 2021-08-19 HISTORY — PX: BIOPSY: SHX5522

## 2021-08-19 LAB — HEPATIC FUNCTION PANEL
ALT: 14 U/L (ref 0–44)
AST: 16 U/L (ref 15–41)
Albumin: 3.3 g/dL — ABNORMAL LOW (ref 3.5–5.0)
Alkaline Phosphatase: 88 U/L (ref 38–126)
Bilirubin, Direct: 0.1 mg/dL (ref 0.0–0.2)
Indirect Bilirubin: 0.4 mg/dL (ref 0.3–0.9)
Total Bilirubin: 0.5 mg/dL (ref 0.3–1.2)
Total Protein: 6.1 g/dL — ABNORMAL LOW (ref 6.5–8.1)

## 2021-08-19 LAB — GLUCOSE, CAPILLARY: Glucose-Capillary: 91 mg/dL (ref 70–99)

## 2021-08-19 SURGERY — ESOPHAGOGASTRODUODENOSCOPY (EGD) WITH PROPOFOL
Anesthesia: General

## 2021-08-19 MED ORDER — LIDOCAINE HCL (CARDIAC) PF 100 MG/5ML IV SOSY
PREFILLED_SYRINGE | INTRAVENOUS | Status: DC | PRN
Start: 2021-08-19 — End: 2021-08-19
  Administered 2021-08-19: 50 mg via INTRAVENOUS

## 2021-08-19 MED ORDER — PROPOFOL 10 MG/ML IV BOLUS
INTRAVENOUS | Status: DC | PRN
  Administered 2021-08-19: 100 mg via INTRAVENOUS

## 2021-08-19 MED ORDER — PROPOFOL 500 MG/50ML IV EMUL
INTRAVENOUS | Status: DC | PRN
  Administered 2021-08-19: 150 ug/kg/min via INTRAVENOUS

## 2021-08-19 MED ORDER — LACTATED RINGERS IV SOLN: INTRAVENOUS | Status: DC

## 2021-08-19 MED ORDER — STERILE WATER FOR IRRIGATION IR SOLN
Status: DC | PRN
  Administered 2021-08-19: 60 mL

## 2021-08-19 MED ORDER — LIDOCAINE HCL (PF) 2 % IJ SOLN
INTRAMUSCULAR | Status: AC
  Filled 2021-08-19: qty 5

## 2021-08-19 MED ORDER — PROPOFOL 1000 MG/100ML IV EMUL
INTRAVENOUS | Status: AC
  Filled 2021-08-19: qty 100

## 2021-08-19 NOTE — Anesthesia Postprocedure Evaluation (Signed)
Anesthesia Post Note  Patient: [name]  Procedure(s) Performed: ESOPHAGOGASTRODUODENOSCOPY (EGD) WITH PROPOFOL BIOPSY  Patient location during evaluation: Phase II Anesthesia Type: General Level of consciousness: awake Pain management: pain level controlled Vital Signs Assessment: post-procedure vital signs reviewed and stable Respiratory status: spontaneous breathing and respiratory function stable Cardiovascular status: blood pressure returned to baseline and stable Postop Assessment: no headache and no apparent nausea or vomiting Anesthetic complications: no Comments: Late entry   No notable events documented.   Last Vitals:  Vitals:   08/19/21 0658 08/19/21 0759  BP: 122/75   Pulse:  65  Resp: 10 16  Temp: 36.7 C 36.7 C  SpO2: 95%     Last Pain:  Vitals:   08/19/21 0759  TempSrc: Oral  PainSc:                  Louann Sjogren

## 2021-08-19 NOTE — Transfer of Care (Signed)
Immediate Anesthesia Transfer of Care Note  Patient: Jeanette Aguirre  Procedure(s) Performed: ESOPHAGOGASTRODUODENOSCOPY (EGD) WITH PROPOFOL BIOPSY  Patient Location: PACU  Anesthesia Type:General  Level of Consciousness: awake, alert , oriented and patient cooperative  Airway & Oxygen Therapy: Patient Spontanous Breathing  Post-op Assessment: Report given to RN, Post -op Vital signs reviewed and stable and Patient moving all extremities X 4  Post vital signs: Reviewed and stable  Last Vitals:  Vitals Value Taken Time  BP    Temp    Pulse    Resp    SpO2      Last Pain:  Vitals:   08/19/21 0743  TempSrc:   PainSc: 3       Patients Stated Pain Goal: 6 (79/03/83 3383)  Complications: No notable events documented.

## 2021-08-19 NOTE — H&P (Signed)
@LOGO @   Primary Care Physician:  Redmond School, MD Primary Gastroenterologist:  Dr. Gala Romney  Pre-Procedure History & Physical: HPI:  Jeanette Aguirre is a 60 y.o. female here for further evaluation of intermittent nausea and vomiting.  No dysphagia.  Intermittent nausea vomiting some vague epigastric pain.  No dysphagia.  Mildly positive alpha gal panel.  Celiac serologies negative.  Reflux well controlled.  Gallbladder is out.  History of iron deficiency anemia.  Normal gastric emptying study.  Cites quite a bit of situational stress at home.  Has taken fluoxetine for years.  Past Medical History:  Diagnosis Date   Anxiety    Depression    Diabetes mellitus without complication ([place])    Family history of adverse reaction to anesthesia    Gastroparesis    GERD (gastroesophageal reflux disease)    Hyperlipemia    Pneumonia    PONV (postoperative nausea and vomiting)     Past Surgical History:  Procedure Laterality Date   CHOLECYSTECTOMY     COLONOSCOPY N/A 09/04/2017   Dr. Gala Romney: multiple tubular adenomas removed, diverticulosis. next TCS in 3 years.    COLONOSCOPY N/A 01/18/2021   Procedure: COLONOSCOPY;  Surgeon: Daneil Dolin, MD;  Location: [facility];  Service: Endoscopy;  Laterality: N/A;  AM (diabetic)   ESOPHAGOGASTRODUODENOSCOPY N/A 04/09/2018   Dr. Gala Romney: inflammation of esophagus c/w acid reflux.    LIPOMA EXCISION Right 03/07/2020   Procedure: EXCISION LIPOMA, THIGH;  Surgeon: Aviva Signs, MD;  Location: [facility];  Service: General;  Laterality: Right;   POLYPECTOMY  01/18/2021   Procedure: POLYPECTOMY;  Surgeon: Daneil Dolin, MD;  Location: [facility];  Service: Endoscopy;;   TONSILLECTOMY     and adenoids   TUBAL LIGATION     TYMPANOSTOMY TUBE PLACEMENT      Prior to Admission medications   Medication Sig Start Date End Date Taking? Authorizing Provider  atorvastatin (LIPITOR) 20 MG tablet Take 20 mg by mouth at bedtime.    Yes [provider]   cetirizine (ZYRTEC) 10 MG tablet Take 10 mg by mouth at bedtime.   Yes [provider]  FLUoxetine (PROZAC) 40 MG capsule Take 40 mg by mouth in the morning.  11/21/19  Yes [provider]  glimepiride (AMARYL) 4 MG tablet Take 4 mg by mouth 2 (two) times daily with breakfast and lunch.   Yes [provider]  JARDIANCE 25 MG TABS tablet Take 25 mg by mouth every morning. 11/12/20  Yes [provider]  ondansetron (ZOFRAN ODT) 4 MG disintegrating tablet Take 1 tablet (4 mg total) by mouth every 6 (six) hours as needed for nausea or vomiting. 05/29/21  Yes Mahala Menghini, PA-C  pantoprazole (PROTONIX) 40 MG tablet Take 1 tablet (40 mg total) by mouth 2 (two) times daily before a meal. For one month, then go back to once daily. Patient taking differently: Take 40 mg by mouth 2 (two) times daily before a meal. 06/06/21  Yes Mahala Menghini, PA-C  promethazine (PHENERGAN) 25 MG tablet Take 1/2 to 1 tablet every 6 hours as needed for nausea/vomiting 06/05/21  Yes Aliene Altes S, PA-C  QUEtiapine (SEROQUEL) 50 MG tablet Take 50 mg by mouth at bedtime as needed for sleep. 07/08/21  Yes [provider]  sitaGLIPtin-metformin (JANUMET) 50-1000 MG tablet Take 1 tablet by mouth 2 (two) times daily with a meal.   Yes [provider]  sucralfate (CARAFATE) 1 GM/10ML suspension Take 10 mLs (1  g total) by mouth 4 (four) times daily -  with meals and at bedtime for 10 days. Patient not taking: Reported on 07/26/2021 06/06/21 06/16/21  Mahala Menghini, PA-C    Allergies as of 07/03/2021 - Review Complete 07/03/2021  Allergen Reaction Noted   Betadine [povidone iodine] Swelling 10/01/2016   Ciprofloxacin Nausea Only 11/27/2017   Lisinopril Swelling 08/23/2018    Family History  Problem Relation Age of Onset   Diabetes Mother    Peripheral vascular disease Mother    Diabetes Father    Colitis Daughter 51       lymphocytic colitis   Colon cancer Neg Hx      Social History   Socioeconomic History   Marital status: Divorced    Spouse name: Not on file   Number of children: Not on file   Years of education: Not on file   Highest education level: Not on file  Occupational History   Not on file  Tobacco Use   Smoking status: Former    Packs/day: 1.00    Years: 15.00    Pack years: 15.00    Types: Cigarettes    Quit date: 09/22/2016    Years since quitting: 4.9   Smokeless tobacco: Never  Vaping Use   Vaping Use: Never used  Substance and Sexual Activity   Alcohol use: Not Currently    Comment: very little   Drug use: No   Sexual activity: Yes  Other Topics Concern   Not on file  Social History Narrative   Not on file   Social Determinants of Health   Financial Resource Strain: Not on file  Food Insecurity: Not on file  Transportation Needs: Not on file  Physical Activity: Not on file  Stress: Not on file  Social Connections: Not on file  Intimate Partner Violence: Not on file    Review of Systems: See HPI, otherwise negative ROS  Physical Exam: BP 122/75   Temp 98.1 F (36.7 C) (Oral)   Resp 10   Ht 5\' 6"  (1.676 m)   Wt 113.4 kg   LMP 03/03/2012   SpO2 95%   BMI 40.35 kg/m  General:   Alert,  Well-developed, well-nourished, pleasant and cooperative in NAD Neck:  Supple; no masses or thyromegaly. No significant cervical adenopathy. Lungs:  Clear throughout to auscultation.   No wheezes, crackles, or rhonchi. No acute distress. Heart:  Regular rate and rhythm; no murmurs, clicks, rubs,  or gallops. Abdomen: Non-distended, normal bowel sounds.  Soft and nontender without appreciable mass or hepatosplenomegaly.  Pulses:  Normal pulses noted. Extremities:  Without clubbing or edema.  Impression/Plan: 60 year old lady with intermittent nausea and vomiting and some vague epigastric pain.  Longstanding diabetes.  No dysphagia.  GERD well-controlled on pantoprazole.  Oestreich emptying study previously normal.   Celiac serologies negative.  Mildly positive alpha gal serologies likely has nothing to do with her ongoing GI symptoms.  Cannot rule out cyclical vomiting syndrome.  We will plan to assess gastric and duodenal mucosa for eosinophils.  Recommendations I have offered the patient a diagnostic EGD today.  The risks, benefits, limitations, alternatives and imponderables have been reviewed with the patient. Potential for esophageal dilation, biopsy, etc. have also been reviewed.  Questions have been answered. All parties agreeable.      Notice: This dictation was prepared with Dragon dictation along with smaller phrase technology. Any transcriptional errors that result from this process are unintentional and may not be corrected upon review.

## 2021-08-19 NOTE — Addendum Note (Signed)
Addendum  created 08/19/21 0859 by Jonna Munro, CRNA   Flowsheet accepted

## 2021-08-19 NOTE — Op Note (Addendum)
[facility] Patient Name: Jeanette Aguirre Procedure Date: 08/19/2021 7:35 AM MRN: [id number] Date of Birth: 1960/12/15 Attending MD: Norvel Richards , MD CSN: [id number] Age: 60 Admit Type: Outpatient Procedure:                Upper GI endoscopy Indications:              Epigastric abdominal pain, Nausea with vomiting Providers:                Norvel Richards, MD, Lambert Mody,                            Kristine L. Risa Grill, Technician Referring MD:              Medicines:                Propofol per Anesthesia Complications:            No immediate complications. Estimated Blood Loss:     Estimated blood loss was minimal. Procedure:                Pre-Anesthesia Assessment:                           - Prior to the procedure, a History and Physical                            was performed, and patient medications and                            allergies were reviewed. The patient's tolerance of                            previous anesthesia was also reviewed. The risks                            and benefits of the procedure and the sedation                            options and risks were discussed with the patient.                            All questions were answered, and informed consent                            was obtained. Prior Anticoagulants: The patient has                            taken no previous anticoagulant or antiplatelet                            agents. ASA Grade Assessment: II - A patient with                            mild systemic disease. After reviewing the risks  and benefits, the patient was deemed in                            satisfactory condition to undergo the procedure.                           After obtaining informed consent, the endoscope was                            passed under direct vision. Throughout the                            procedure, the patient's blood pressure, pulse, and                             oxygen saturations were monitored continuously. The                            [id number]-H190 ([id number]) scope was introduced through the                            mouth, and advanced to the second part of duodenum.                            The upper GI endoscopy was accomplished without                            difficulty. The patient tolerated the procedure                            well. Scope In: 7:48:06 AM Scope Out: 7:54:47 AM Total Procedure Duration: 0 hours 6 minutes 41 seconds  Findings:      The examined esophagus was normal except for a accentuated undulating       Z-line.      The entire examined stomach was normal aside from minimally polypoid       antral mucosa..      The duodenal bulb and second portion of the duodenum were normal.      Biopsies of the gastric fundus, body and antrum along with the second       portion of the duodenum were taken for histologic study. Impression:               - Normal esophagus.                           - Normal stomach.                           - Normal duodenal bulb and second portion of the                            duodenum.                           -Status post gastric and duodenal biopsy.                           -  Etiology of intermittent nausea not clear. Need to                            rule out eosinophilic gastroenteritis. This                            mitigates against significant delayed gastric                            emptying. Cannot rule out cyclical vomiting                            syndrome. Doubt common duct stone. Minimally                            elevated alkaline phosphatase. No biliary dilation                            on prior CT. I doubt mildly elevated alpha gal                            serologies has anything to do with her symptoms. Moderate Sedation:      Moderate (conscious) sedation was personally administered by an       anesthesia professional. The following  parameters were monitored: oxygen       saturation, heart rate, blood pressure, respiratory rate, EKG, adequacy       of pulmonary ventilation, and response to care. Recommendation:           - Patient has a contact number available for                            emergencies. The signs and symptoms of potential                            delayed complications were discussed with the                            patient. Return to normal activities tomorrow.                            Written discharge instructions were provided to the                            patient.                           - Advance diet as tolerated.                           - Continue present medications.                           - Return to my office in 4 weeks. Further  recommendations to follow. Hepatic function profile                            today. Procedure Code(s):        --- Professional ---                           [phone], Esophagogastroduodenoscopy, flexible,                            transoral; diagnostic, including collection of                            specimen(s) by brushing or washing, when performed                            (separate procedure) Diagnosis Code(s):        --- Professional ---                           R10.13, Epigastric pain                           R11.2, Nausea with vomiting, unspecified CPT copyright 2019 American Medical Association. All rights reserved. The codes documented in this report are preliminary and upon coder review may  be revised to meet current compliance requirements. Cristopher Estimable. Adriann Thau, MD Norvel Richards, MD 08/19/2021 8:11:06 AM This report has been signed electronically. Number of Addenda: 0

## 2021-08-19 NOTE — Discharge Instructions (Addendum)
EGD Discharge instructions Please read the instructions outlined below and refer to this sheet in the next few weeks. These discharge instructions provide you with general information on caring for yourself after you leave the hospital. Your doctor may also give you specific instructions. While your treatment has been planned according to the most current medical practices available, unavoidable complications occasionally occur. If you have any problems or questions after discharge, please call your doctor. ACTIVITY You may resume your regular activity but move at a slower pace for the next 24 hours.  Take frequent rest periods for the next 24 hours.  Walking will help expel (get rid of) the air and reduce the bloated feeling in your abdomen.  No driving for 24 hours (because of the anesthesia (medicine) used during the test).  You may shower.  Do not sign any important legal documents or operate any machinery for 24 hours (because of the anesthesia used during the test).  NUTRITION Drink plenty of fluids.  You may resume your normal diet.  Begin with a light meal and progress to your normal diet.  Avoid alcoholic beverages for 24 hours or as instructed by your caregiver.  MEDICATIONS You may resume your normal medications unless your caregiver tells you otherwise.  WHAT YOU CAN EXPECT TODAY You may experience abdominal discomfort such as a feeling of fullness or "gas" pains.  FOLLOW-UP Your doctor will discuss the results of your test with you.  SEEK IMMEDIATE MEDICAL ATTENTION IF ANY OF THE FOLLOWING OCCUR: Excessive nausea (feeling sick to your stomach) and/or vomiting.  Severe abdominal pain and distention (swelling).  Trouble swallowing.  Temperature over 101 F (37.8 C).  Rectal bleeding or vomiting of blood.   Your upper GI tract looked good today.  As planned, samples of your stomach and small intestine were taken.  Further recommendations to follow pending review of pathology  report  Hepatic function profile today.  Office visit with Neil Crouch in 4 to 6 weeks  At patient request, I called Lucrezia Europe at [phone] -reviewed findings and recommendations

## 2021-08-19 NOTE — Anesthesia Preprocedure Evaluation (Addendum)
Anesthesia Evaluation  Patient identified by MRN, date of birth, ID band Patient awake    Reviewed: Allergy & Precautions, H&P , NPO status , Patient's Chart, lab work & pertinent test results, reviewed documented beta blocker date and time   History of Anesthesia Complications (+) PONV and history of anesthetic complications  Airway Mallampati: II  TM Distance: >3 FB Neck ROM: full    Dental no notable dental hx.    Pulmonary neg pulmonary ROS, former smoker,    Pulmonary exam normal breath sounds clear to auscultation       Cardiovascular Exercise Tolerance: Good negative cardio ROS   Rhythm:regular Rate:Normal     Neuro/Psych PSYCHIATRIC DISORDERS Anxiety Depression negative neurological ROS  negative psych ROS   GI/Hepatic negative GI ROS, Neg liver ROS, GERD  Medicated,  Endo/Other  negative endocrine ROSdiabetesMorbid obesity  Renal/GU negative Renal ROS  negative genitourinary   Musculoskeletal   Abdominal   Peds  Hematology negative hematology ROS (+) Blood dyscrasia, anemia ,   Anesthesia Other Findings   Reproductive/Obstetrics negative OB ROS                            Anesthesia Physical Anesthesia Plan  ASA: 3  Anesthesia Plan: General   Post-op Pain Management:    Induction:   PONV Risk Score and Plan: Propofol infusion  Airway Management Planned:   Additional Equipment:   Intra-op Plan:   Post-operative Plan:   Informed Consent: I have reviewed the patients History and Physical, chart, labs and discussed the procedure including the risks, benefits and alternatives for the proposed anesthesia with the patient or authorized representative who has indicated his/her understanding and acceptance.     Dental Advisory Given  Plan Discussed with: CRNA  Anesthesia Plan Comments:        Anesthesia Quick Evaluation

## 2021-08-20 LAB — SURGICAL PATHOLOGY

## 2021-08-21 ENCOUNTER — Encounter (HOSPITAL_COMMUNITY): Payer: Self-pay | Admitting: Internal Medicine

## 2021-08-30 ENCOUNTER — Telehealth: Payer: Self-pay | Admitting: *Deleted

## 2021-08-30 ENCOUNTER — Other Ambulatory Visit: Payer: Self-pay | Admitting: Gastroenterology

## 2021-08-30 MED ORDER — PANTOPRAZOLE SODIUM 40 MG PO TBEC: 40.0000 mg | DELAYED_RELEASE_TABLET | Freq: Two times a day (BID) | ORAL | 3 refills | Status: AC

## 2021-08-30 NOTE — Telephone Encounter (Signed)
FMLA papers were filled out and faxed to [employer] on 07/30/21

## 2021-09-02 ENCOUNTER — Telehealth: Payer: Self-pay

## 2021-09-02 NOTE — Telephone Encounter (Signed)
PA for pantoprazole sodium 40 mg dr tablets has been approved through 08/30/22. Approval letter to be scanned into pt's chart.

## 2021-09-25 ENCOUNTER — Other Ambulatory Visit: Payer: Self-pay

## 2021-09-25 ENCOUNTER — Ambulatory Visit (INDEPENDENT_AMBULATORY_CARE_PROVIDER_SITE_OTHER): Payer: PRIVATE HEALTH INSURANCE | Admitting: Allergy & Immunology

## 2021-09-25 ENCOUNTER — Encounter: Payer: Self-pay | Admitting: Allergy & Immunology

## 2021-09-25 VITALS — BP 130/70 | HR 75 | Temp 97.2°F | Resp 18 | Ht 66.0 in | Wt 243.4 lb

## 2021-09-25 DIAGNOSIS — T7840XA Allergy, unspecified, initial encounter: Secondary | ICD-10-CM

## 2021-09-25 DIAGNOSIS — B999 Unspecified infectious disease: Secondary | ICD-10-CM | POA: Diagnosis not present

## 2021-09-25 DIAGNOSIS — T7800XD Anaphylactic reaction due to unspecified food, subsequent encounter: Secondary | ICD-10-CM

## 2021-09-25 NOTE — Progress Notes (Signed)
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 presents for a an evaluation of abdominal pain and nausea with vomiting. Testing has demonstrated slight positives to beef, pork, and lamb with a notable absence of IgE to alpha gal. She has noticed in retrospect that these episodes might have been related to pork exposure. She has been eating beef without a problem on a number of occasions, upwards of 5-7 times per week. She has no atopic history otherwise and is tolerating all of the major food allergens without adverse event, therefore I do not feel that an additional workup including allergy testing is needed at this time. We can certainly look into getting a tryptase in the future at the next event to see if this is related to an allergic reaction. But right now, I did not feel that more testing was necessary, given the likely expense involved. We are going to send in an EpiPen to be on the safe side. We provided demonstration of how to use it and an anaphylaxis management plan to indicate when to use it.   Plan/Recommendations:   1. 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 - Beef was so low that I do not think that this is relevant (especially since you eat it so regularly without a problem). - Anaphylaxis management plan provided. - EpiPen training provided. - We can do skin testing for foods in the future if needed, but it seems that this might all be related to pork exposure. - I just do not want to waste your time and money, but I am open to testing in the future if deemed necessary.   2. Return in about 3 months (around 12/24/2021).    This note in its entirety was  forwarded to the Provider who requested this consultation.  Subjective:   Jeanette Aguirre is a 61 y.o. female presenting today for evaluation of  Chief Complaint  Patient presents with   Allergy Testing    Jeanette Aguirre has a history of the following: Patient Active Problem List   Diagnosis Date Noted   IDA (iron deficiency anemia) 07/03/2021   Anemia 11/21/2020   B12 deficiency 11/21/2020   H/O adenomatous polyp of colon 04/23/2020   Lipoma of right lower extremity    Fatty liver 05/10/2019   GERD (gastroesophageal reflux disease) 08/23/2018   Bloating 08/23/2018   Normocytic anemia 03/18/2018   Chronic diarrhea 11/27/2017   Nausea with vomiting 11/27/2017   Hepatomegaly 11/27/2017   Flu 10/02/2016   Community acquired pneumonia 10/01/2016   Smoker 10/01/2016   Hypokalemia 10/01/2016   Acute respiratory failure with hypoxemia ([place]) 10/01/2016    History obtained from: chart review and patient.  Jeanette Aguirre was referred by Redmond School, MD.     Jeanette Aguirre is a 61 y.o. female presenting for an evaluation of possible food allergies .  She started having issues a couple of years ago. She has been dealing with this for a while. She was diagnosed with  gastroparesis. She has intermittent vomiting. She goes through spells with this. She has tried keeping a diary and then she stopped having issues. She stopped having problems around  September until this past Christmas.   She reports that she is bloated when this starts. She can "burp a certain way" and get a "funny taste" in her mouth, she knows that she is going to have problems. She has both Zofran and Phenergan and will use each depending on the time of the day (since Phenergan causes sleepiness).   There is no common food associated with these episodes. She seemingly tolerates all of the major food allergens without adverse event.   She had an endoscopy in April 2022. This was totally normal. She has bene on Protonix for years.    She had a colonoscopy on November 28th. She had a screening colonoscopy and she has a history of colon polyps. This was normal aside from a couple of polyps. She is now on the 3 year plan.   She did get sick over Christmas. She went to the family get together over the holidays. She had a large dinner on the table with ham, dressing, etc. She did not avoid the ham and ate it anyway. She was actually never told to avoid red meat.   She estimate that she eats red meat 5 to 7 times per week or so. She just never knows what is going to trigger this. She never had any other testing performed from a food allergy perspective.        Otherwise, there is no history of other atopic diseases, including asthma, drug allergies, environmental allergies, stinging insect allergies, eczema, urticaria, or contact dermatitis. There is no significant infectious history. Vaccinations are up to date.    Past Medical History: Patient Active Problem List   Diagnosis Date Noted   IDA (iron deficiency anemia) 07/03/2021   Anemia 11/21/2020   B12 deficiency 11/21/2020   H/O adenomatous polyp of colon 04/23/2020   Lipoma of right lower extremity    Fatty liver 05/10/2019   GERD (gastroesophageal reflux disease) 08/23/2018   Bloating 08/23/2018   Normocytic anemia 03/18/2018   Chronic diarrhea 11/27/2017   Nausea with vomiting 11/27/2017   Hepatomegaly 11/27/2017   Flu 10/02/2016   Community acquired pneumonia 10/01/2016   Smoker 10/01/2016   Hypokalemia 10/01/2016   Acute respiratory failure with hypoxemia ([place]) 10/01/2016    Medication List:  Allergies as of 09/25/2021       Reactions   Lisinopril Swelling   Lip swelling/tingling/itching   Betadine [povidone Iodine] Swelling   Ciprofloxacin Nausea Only        Medication List        Accurate as of September 25, 2021 11:59 PM. If you have any questions, ask your nurse or doctor.          STOP taking these medications    sucralfate 1  GM/10ML suspension Commonly known as: Carafate Stopped by: Valentina Shaggy, MD       TAKE these medications    atorvastatin 20 MG tablet Commonly known as: LIPITOR Take 20 mg by mouth at bedtime.   cetirizine 10 MG tablet Commonly known as: ZYRTEC Take 10 mg by mouth at bedtime.   EPINEPHrine 0.3 mg/0.3 mL Soaj injection Commonly known as: EpiPen 2-Pak Inject 0.3 mg into the muscle once for 1 dose. Started by: Valentina Shaggy, MD   FLUoxetine 40 MG capsule Commonly known as: PROZAC Take 40 mg by mouth in the morning.   glimepiride 4 MG tablet Commonly known as: AMARYL Take 4 mg by mouth 2 (two) times daily with breakfast and lunch.   Jardiance  25 MG Tabs tablet Generic drug: empagliflozin Take 25 mg by mouth every morning.   ondansetron 4 MG disintegrating tablet Commonly known as: Zofran ODT Take 1 tablet (4 mg total) by mouth every 6 (six) hours as needed for nausea or vomiting.   pantoprazole 40 MG tablet Commonly known as: PROTONIX Take 1 tablet (40 mg total) by mouth 2 (two) times daily before a meal. For one month, then go back to once daily.   promethazine 25 MG tablet Commonly known as: PHENERGAN Take 1/2 to 1 tablet every 6 hours as needed for nausea/vomiting   QUEtiapine 50 MG tablet Commonly known as: SEROQUEL Take 50 mg by mouth at bedtime as needed for sleep.   sitaGLIPtin-metformin 50-1000 MG tablet Commonly known as: JANUMET Take 1 tablet by mouth 2 (two) times daily with a meal.        Birth History: non-contributory  Developmental History: non-contributory  Past Surgical History: Past Surgical History:  Procedure Laterality Date   ADENOIDECTOMY     BIOPSY  08/19/2021   Procedure: BIOPSY;  Surgeon: Daneil Dolin, MD;  [facility];  Service: Endoscopy;;   CHOLECYSTECTOMY     COLONOSCOPY N/A 09/04/2017   Dr. Gala Romney: multiple tubular adenomas removed, diverticulosis. next TCS in 3 years.    COLONOSCOPY N/A  01/18/2021   Procedure: COLONOSCOPY;  Surgeon: Daneil Dolin, MD;  [facility]: [facility];  Service: Endoscopy;  Laterality: N/A;  AM (diabetic)   ESOPHAGOGASTRODUODENOSCOPY N/A 04/09/2018   Dr. Gala Romney: inflammation of esophagus c/w acid reflux.    ESOPHAGOGASTRODUODENOSCOPY (EGD) WITH PROPOFOL N/A 08/19/2021   Procedure: ESOPHAGOGASTRODUODENOSCOPY (EGD) WITH PROPOFOL;  Surgeon: Daneil Dolin, MD;  [facility];  Service: Endoscopy;  Laterality: N/A;  11:15am   LIPOMA EXCISION Right 03/07/2020   Procedure: EXCISION LIPOMA, THIGH;  Surgeon: Aviva Signs, MD;  Location: [facility];  Service: General;  Laterality: Right;   POLYPECTOMY  01/18/2021   Procedure: POLYPECTOMY;  Surgeon: Daneil Dolin, MD;  [facility];  Service: Endoscopy;;   TONSILLECTOMY     and adenoids   TUBAL LIGATION     TYMPANOSTOMY TUBE PLACEMENT       Family History: Family History  Problem Relation Age of Onset   Diabetes Mother    Peripheral vascular disease Mother    Diabetes Father    Colitis Daughter 39       lymphocytic colitis   Colon cancer Neg Hx      Social History: Kashina lives at home with her roommate. She currently works at [employer] as a Sports coach. She has been there for a number of years. There is one cat in the home. She is a smoker.   Review of Systems  Constitutional: Negative.  Negative for fever, malaise/fatigue and weight loss.  HENT: Negative.  Negative for congestion, ear discharge and ear pain.   Eyes:  Negative for pain, discharge and redness.  Respiratory:  Negative for cough, sputum production, shortness of breath and wheezing.   Cardiovascular: Negative.  Negative for chest pain and palpitations.  Gastrointestinal:  Positive for nausea and vomiting. Negative for abdominal pain and heartburn.  Skin: Negative.  Negative for itching and rash.  Neurological:  Negative for dizziness and headaches.  Endo/Heme/Allergies:  Negative for environmental  allergies. Does not bruise/bleed easily.      Objective:   Blood pressure 130/70, pulse 75, temperature (!) 97.2 F (36.2 C), temperature source Temporal, resp. rate 18, height 5\' 6"  (  1.676 m), weight 243 lb 6.4 oz (110.4 kg), last menstrual period 03/03/2012, SpO2 97 %. Body mass index is 39.29 kg/m.   Physical Exam:   Physical Exam Vitals reviewed.  Constitutional:      Appearance: She is well-developed. She is obese.  HENT:     Head: Normocephalic and atraumatic.     Right Ear: Tympanic membrane, ear canal and external ear normal. No drainage, swelling or tenderness. Tympanic membrane is not injected, scarred, erythematous, retracted or bulging.     Left Ear: Tympanic membrane, ear canal and external ear normal. No drainage, swelling or tenderness. Tympanic membrane is not injected, scarred, erythematous, retracted or bulging.     Nose: No nasal deformity, septal deviation, mucosal edema or rhinorrhea.     Right Turbinates: Not enlarged, swollen or pale.     Left Turbinates: Not enlarged, swollen or pale.     Right Sinus: No maxillary sinus tenderness or frontal sinus tenderness.     Left Sinus: No maxillary sinus tenderness or frontal sinus tenderness.     Mouth/Throat:     Mouth: Mucous membranes are not pale and not dry.     Pharynx: Uvula midline.  Eyes:     General: Lids are normal.        Right eye: No discharge.        Left eye: No discharge.     Conjunctiva/sclera: Conjunctivae normal.     Right eye: Right conjunctiva is not injected. No chemosis.    Left eye: Left conjunctiva is not injected. No chemosis.    Pupils: Pupils are equal, round, and reactive to light.  Cardiovascular:     Rate and Rhythm: Normal rate and regular rhythm.     Heart sounds: Normal heart sounds.  Pulmonary:     Effort: Pulmonary effort is normal. No tachypnea, accessory muscle usage or respiratory distress.     Breath sounds: Normal breath sounds. No wheezing, rhonchi or rales.  Chest:      Chest Study: No tenderness.  Abdominal:     Tenderness: There is no abdominal tenderness. There is no guarding or rebound.     Comments: No abdominal pain today.   Lymphadenopathy:     Head:     Right side of head: No submandibular, tonsillar or occipital adenopathy.     Left side of head: No submandibular, tonsillar or occipital adenopathy.     Cervical: No cervical adenopathy.  Skin:    Coloration: Skin is not pale.     Findings: No abrasion, erythema, petechiae or rash. Rash is not papular, urticarial or vesicular.  Neurological:     Mental Status: She is alert.  Psychiatric:        Behavior: Behavior is cooperative.     Diagnostic studies: none        Salvatore Marvel, MD [facility]

## 2021-09-25 NOTE — Patient Instructions (Addendum)
1. 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 - Beef was so low that I do not think that this is relevant (especially since you eat it so regularly without a problem). - Anaphylaxis management plan provided. - EpiPen training provided. - We can do skin testing for foods in the future if needed, but it seems that this might all be related to pork exposure.  - I just do not want to waste your time and money.   2. Return in about 3 months (around 12/24/2021).    Please inform us of any Emergency Department visits, hospitalizations, or changes in symptoms. Call us before going to the ED for breathing or allergy symptoms since we might be able to fit you in for a sick visit. Feel free to contact us anytime with any questions, problems, or concerns.  It was a pleasure to meet you today!  Websites that have reliable patient information: 1. American Academy of Asthma, Allergy, and Immunology: www.aaaai.org 2. Food Allergy Research and Education (FARE): foodallergy.org 3. Mothers of Asthmatics: http://www.asthmacommunitynetwork.org 4. American College of Allergy, Asthma, and Immunology: www.acaai.org   COVID-19 Vaccine Information can be found at: ShippingScam.co.uk For questions related to vaccine distribution or appointments, please email [email or web address] or call [phone].   We realize that you might be concerned about having an allergic reaction to the COVID19 vaccines. To help with that concern, WE ARE OFFERING THE COVID19 VACCINES IN OUR OFFICE! Ask the front desk for dates!     Like [place] on [place] and Instagram for our latest updates!      A healthy democracy works best when [employer] participate! Make sure you are registered to vote! If you have moved or changed any of your contact information, you will need to get this updated before voting!  In some cases, you MAY be able to  register to vote online: [email or web address]

## 2021-09-26 ENCOUNTER — Encounter: Payer: Self-pay | Admitting: Allergy & Immunology

## 2021-09-26 MED ORDER — EPINEPHRINE 0.3 MG/0.3ML IJ SOAJ: 0.3000 mg | Freq: Once | INTRAMUSCULAR | 1 refills | Status: AC

## 2021-10-04 ENCOUNTER — Encounter: Payer: Self-pay | Admitting: Gastroenterology

## 2021-10-04 ENCOUNTER — Other Ambulatory Visit: Payer: Self-pay

## 2021-10-04 ENCOUNTER — Ambulatory Visit (INDEPENDENT_AMBULATORY_CARE_PROVIDER_SITE_OTHER): Payer: PRIVATE HEALTH INSURANCE | Admitting: Gastroenterology

## 2021-10-04 VITALS — BP 132/67 | HR 65 | Temp 96.8°F | Ht 66.0 in | Wt 248.4 lb

## 2021-10-04 DIAGNOSIS — K219 Gastro-esophageal reflux disease without esophagitis: Secondary | ICD-10-CM

## 2021-10-04 DIAGNOSIS — D509 Iron deficiency anemia, unspecified: Secondary | ICD-10-CM

## 2021-10-04 DIAGNOSIS — K760 Fatty (change of) liver, not elsewhere classified: Secondary | ICD-10-CM

## 2021-10-04 MED ORDER — HYOSCYAMINE SULFATE 0.125 MG PO TABS: 0.1250 mg | ORAL_TABLET | ORAL | 0 refills | Status: DC | PRN

## 2021-10-04 NOTE — Progress Notes (Signed)
Primary Care Physician: Redmond School, MD  Primary Gastroenterologist:  Garfield Cornea, MD   Chief Complaint  Patient presents with   Nausea    Vomiting. Had episode around Christmas. Pp f/u. Wants to know next step.    HPI: Jeanette Aguirre is a 60 y.o. female here for follow up. H/O slightly delayed gastric emptying, fatty liver, iron def anemia, B12 def, GERD, positive Alpha Gal.   Since her last OV she completed EGD which was unremarkable, negative gastric and duodenal biopsies as well. Colonoscopy completed 12/2020, with two tubular adenomas. She has seen the allergist for positive Alpha Gal labs. Higher numbers to pork. Epi-Pen given, patient advised to avoid pork/lamb. She appears to tolerate beef.   Her last episode of N/V/D occurred at Christmas. She had ham (small portion) along with other sides. She wonders if the pork causes her symptoms although she says she had a bacon biscuit within the past 24hours and has had no problems. In between episodes she feels normal. In the past she would typically have about 2 episodes a year that would last couple of days. This past year she had at least four in the past six months. She also wonders if stress is causes some of her symptoms. She does not milder symptoms of abdominal pain and nausea at times but does not always end up in full episode. She notes certain type of burp, taste metallic, that always precedes episodes. Usually vomiting +/- diarrhea will start withint 2 hours. Denies heartburn. Sometimes has epigastric burning. No melena, brbpr. Takes pantoprazole once daily.   Last A1C around 7.3 per patient.   EGD 07/2021: normal, negative gastric and duodenum biopsies.   Colonoscopy 12/2020: two tubular adenomas removed.   CT A/P with contrast 08/2020: fatty liver, moderate stool throughout the colon.  Current Outpatient Medications  Medication Sig Dispense Refill   atorvastatin (LIPITOR) 20 MG tablet Take 20 mg by mouth at  bedtime.      cetirizine (ZYRTEC) 10 MG tablet Take 10 mg by mouth at bedtime.     FLUoxetine (PROZAC) 40 MG capsule Take 40 mg by mouth in the morning.      glimepiride (AMARYL) 4 MG tablet Take 4 mg by mouth 2 (two) times daily with breakfast and lunch.     JARDIANCE 25 MG TABS tablet Take 25 mg by mouth every morning.     ondansetron (ZOFRAN ODT) 4 MG disintegrating tablet Take 1 tablet (4 mg total) by mouth every 6 (six) hours as needed for nausea or vomiting. 60 tablet 0   pantoprazole (PROTONIX) 40 MG tablet Take 1 tablet (40 mg total) by mouth 2 (two) times daily before a meal. For one month, then go back to once daily. 60 tablet 3   promethazine (PHENERGAN) 25 MG tablet Take 1/2 to 1 tablet every 6 hours as needed for nausea/vomiting 30 tablet 0   QUEtiapine (SEROQUEL) 50 MG tablet Take 50 mg by mouth at bedtime as needed for sleep.     sitaGLIPtin-metformin (JANUMET) 50-1000 MG tablet Take 1 tablet by mouth 2 (two) times daily with a meal.     No current facility-administered medications for this visit.    Allergies as of 10/04/2021 - Review Complete 10/04/2021  Allergen Reaction Noted   Lisinopril Swelling 08/23/2018   Betadine [povidone iodine] Swelling 10/01/2016   Ciprofloxacin Nausea Only 11/27/2017    ROS:  General: Negative for anorexia, weight loss, fever, chills, fatigue, weakness. ENT: Negative for  hoarseness, difficulty swallowing , nasal congestion. CV: Negative for chest pain, angina, palpitations, dyspnea on exertion, peripheral edema.  Respiratory: Negative for dyspnea at rest, dyspnea on exertion, cough, sputum, wheezing.  GI: See history of present illness. GU:  Negative for dysuria, hematuria, urinary incontinence, urinary frequency, nocturnal urination.  Endo: Negative for unusual weight change.    Physical Examination:   BP 132/67    Pulse 65    Temp (!) 96.8 F (36 C) (Temporal)    Ht 5\' 6"  (1.676 m)    Wt 248 lb 6.4 oz (112.7 kg)    LMP 03/03/2012     BMI 40.09 kg/m   General: Well-nourished, well-developed in no acute distress.  Eyes: No icterus. Mouth:masked Lungs: Clear to auscultation bilaterally.  Heart: Regular rate and rhythm, no murmurs rubs or gallops.  Abdomen: Bowel sounds are normal, nontender, nondistended, no hepatosplenomegaly or masses, no abdominal bruits or hernia , no rebound or guarding.   Extremities: No lower extremity edema. No clubbing or deformities. Neuro: Alert and oriented x 4   Skin: Warm and dry, no jaundice.   Psych: Alert and cooperative, normal mood and affect.  Labs:  Lab Results  Component Value Date   WBC 9.2 07/04/2021   HGB 11.1 07/04/2021   HCT 35.7 07/04/2021   MCV 82 07/04/2021   PLT 339 07/04/2021   Lab Results  Component Value Date   IRON 27 (L) 05/29/2021   TIBC 398 05/29/2021   FERRITIN 11 05/29/2021   Lab Results  Component Value Date   VITAMINB12 252 12/07/2020    Lab Results  Component Value Date   ALT 14 08/19/2021   AST 16 08/19/2021   ALKPHOS 88 08/19/2021   BILITOT 0.5 08/19/2021   Lab Results  Component Value Date   CREATININE 0.86 07/04/2021   BUN 11 07/04/2021   NA 140 07/04/2021   K 4.8 07/04/2021   CL 99 07/04/2021   CO2 21 07/04/2021      Imaging Studies: No results found.   Assessment:  Episodic N/V:  associated with upper abdominal pain. Sometimes with diarrhea as well. Symptoms previously occurring couple of times per year but have been more progressive over the past six months. Notes increased stress with her daughter who moved back locally. She has prior delayed gastric emptying at 3 and 4 hours but I doubt this explains her symptoms. Her typical reflux is well controlled. And recent EGD unremarkable. Her Alpha Gal was mildly positive, mostly to pork. She continues to eat pork. Have advised to avoid, to see how her symptoms settle out. Also offered her a referral to [place] for second opinion regarding her symptoms.   IDA: labs in 05/2021 with  mildly low Hgb 11.5, low normal iron/ferritin. EGD and colonoscopy up to date. Duodenal biopsies negative for celiac. Await [facility] referral. If no additional labs done, we can update.   Fatty liver: continue to strive for low-fat foods, tight glycemic control, exercise regular, limit etoh, weight loss.   GERD: well controlled.    Plan: Referral for second opinion at [facility] for episodic N/V/D, abdominal pain. Trial of Levsin.  Continue pantoprazole.  Update labs for anemia if not done at [facility].

## 2021-10-04 NOTE — Patient Instructions (Signed)
Referral to [facility] for second opinion as discussed. Levsin, put one tablet on tongue and dissolve when you feel your episodes of vomiting/diarrhea coming on. You can take up to four per day if needed.

## 2021-10-06 ENCOUNTER — Encounter: Payer: Self-pay | Admitting: Gastroenterology

## 2021-10-07 ENCOUNTER — Other Ambulatory Visit: Payer: Self-pay | Admitting: *Deleted

## 2021-10-07 DIAGNOSIS — K529 Noninfective gastroenteritis and colitis, unspecified: Secondary | ICD-10-CM

## 2021-10-07 DIAGNOSIS — R112 Nausea with vomiting, unspecified: Secondary | ICD-10-CM

## 2021-11-06 ENCOUNTER — Other Ambulatory Visit: Payer: Self-pay

## 2021-11-06 ENCOUNTER — Telehealth: Payer: Self-pay | Admitting: Gastroenterology

## 2021-11-06 DIAGNOSIS — R748 Abnormal levels of other serum enzymes: Secondary | ICD-10-CM

## 2021-11-06 DIAGNOSIS — E538 Deficiency of other specified B group vitamins: Secondary | ICD-10-CM

## 2021-11-06 DIAGNOSIS — D509 Iron deficiency anemia, unspecified: Secondary | ICD-10-CM

## 2021-11-06 DIAGNOSIS — R112 Nausea with vomiting, unspecified: Secondary | ICD-10-CM

## 2021-11-06 NOTE — Addendum Note (Signed)
Addended by: Hassan Rowan on: 11/06/2021 01:25 PM   Modules accepted: Orders

## 2021-11-06 NOTE — Telephone Encounter (Signed)
Referral faxed to [facility]. MyChart message sent to pt to let her know.

## 2021-11-06 NOTE — Telephone Encounter (Signed)
Please let pt know that her referral to [facility] was denied for second opinion. "Additional evaluation with [facility] is very unlikely to alter the patient's diagnosis or management".   Please offer her referral to [facility] or [facility]. Please put referral for "episodic N/V"  Since her referral has been decline we need to go ahead and update her labs for IDA, abnormal lfts: CMET, CBC, iron/tibc/ferritin.

## 2021-11-06 NOTE — Telephone Encounter (Signed)
Let's try [facility]

## 2021-11-06 NOTE — Telephone Encounter (Signed)
Pt was made aware and verbalized understanding. Pt is willing to have referral sent to which ever one you think is best. Labs were also ordered and pt was advised to have completed.

## 2021-11-12 NOTE — Telephone Encounter (Signed)
OK. Last try [facility].

## 2021-11-12 NOTE — Telephone Encounter (Signed)
Message sednt to pt to see if she is agreeable to [facility] referral

## 2021-11-12 NOTE — Telephone Encounter (Signed)
Received fax from [facility] stating they are unable to provide services in a timely manner and are recommending patient be seen by a physician in their local area. Please follow up with the patient and provide with an alternate plan of care.

## 2021-11-13 ENCOUNTER — Encounter: Payer: Self-pay | Admitting: Allergy & Immunology

## 2021-11-13 NOTE — Addendum Note (Signed)
Addended by: Cheron Every on: 11/13/2021 10:14 AM   Modules accepted: Orders

## 2021-11-13 NOTE — Telephone Encounter (Signed)
Spoke with pt. Agreeable to referral. Referral faxed to [facility]

## 2021-12-06 LAB — COMPREHENSIVE METABOLIC PANEL
ALT: 18 IU/L (ref 0–32)
AST: 16 IU/L (ref 0–40)
Albumin/Globulin Ratio: 1.8 (ref 1.2–2.2)
Albumin: 4.4 g/dL (ref 3.8–4.9)
Alkaline Phosphatase: 139 IU/L — ABNORMAL HIGH (ref 44–121)
BUN/Creatinine Ratio: 10 (ref 9–23)
BUN: 10 mg/dL (ref 6–24)
Bilirubin Total: 0.3 mg/dL (ref 0.0–1.2)
CO2: 21 mmol/L (ref 20–29)
Calcium: 9.6 mg/dL (ref 8.7–10.2)
Chloride: 103 mmol/L (ref 96–106)
Creatinine, Ser: 0.99 mg/dL (ref 0.57–1.00)
Globulin, Total: 2.5 g/dL (ref 1.5–4.5)
Glucose: 192 mg/dL — ABNORMAL HIGH (ref 70–99)
Potassium: 3.8 mmol/L (ref 3.5–5.2)
Sodium: 143 mmol/L (ref 134–144)
Total Protein: 6.9 g/dL (ref 6.0–8.5)
eGFR: 66 mL/min/{1.73_m2} (ref >59)

## 2021-12-06 LAB — CBC WITH DIFFERENTIAL/PLATELET
Basophils Absolute: 0 10*3/uL (ref 0.0–0.2)
Basos: 0 %
EOS (ABSOLUTE): 0.2 10*3/uL (ref 0.0–0.4)
Eos: 2 %
Hematocrit: 34.5 % (ref 34.0–46.6)
Hemoglobin: 11.1 g/dL (ref 11.1–15.9)
Immature Grans (Abs): 0 10*3/uL (ref 0.0–0.1)
Immature Granulocytes: 0 %
Lymphocytes Absolute: 3.3 10*3/uL — ABNORMAL HIGH (ref 0.7–3.1)
Lymphs: 34 %
MCH: 25.5 pg — ABNORMAL LOW (ref 26.6–33.0)
MCHC: 32.2 g/dL (ref 31.5–35.7)
MCV: 79 fL (ref 79–97)
Monocytes Absolute: 0.4 10*3/uL (ref 0.1–0.9)
Monocytes: 4 %
Neutrophils Absolute: 5.9 10*3/uL (ref 1.4–7.0)
Neutrophils: 60 %
Platelets: 381 10*3/uL (ref 150–450)
RBC: 4.36 x10E6/uL (ref 3.77–5.28)
RDW: 14.3 % (ref 11.7–15.4)
WBC: 9.8 10*3/uL (ref 3.4–10.8)

## 2021-12-06 LAB — IRON,TIBC AND FERRITIN PANEL
Ferritin: 14 ng/mL — ABNORMAL LOW (ref 15–150)
Iron Saturation: 10 % — ABNORMAL LOW (ref 15–55)
Iron: 35 ug/dL (ref 27–159)
Total Iron Binding Capacity: 355 ug/dL (ref 250–450)
UIBC: 320 ug/dL (ref 131–425)

## 2021-12-06 LAB — VITAMIN B12: Vitamin B-12: 240 pg/mL (ref 232–1245)

## 2021-12-27 ENCOUNTER — Ambulatory Visit: Payer: PRIVATE HEALTH INSURANCE | Admitting: Allergy & Immunology

## 2022-04-21 ENCOUNTER — Other Ambulatory Visit: Payer: Self-pay

## 2022-04-21 DIAGNOSIS — R7989 Other specified abnormal findings of blood chemistry: Secondary | ICD-10-CM

## 2022-04-21 DIAGNOSIS — D509 Iron deficiency anemia, unspecified: Secondary | ICD-10-CM

## 2022-06-25 ENCOUNTER — Ambulatory Visit
Admission: RE | Admit: 2022-06-25 | Discharge: 2022-06-25 | Disposition: A | Discharge status: Home / Self Care | Payer: PRIVATE HEALTH INSURANCE | Source: Ambulatory Visit | Attending: Family Medicine | Admitting: Family Medicine

## 2022-06-25 VITALS — BP 136/77 | HR 79 | Temp 98.8°F | Resp 16

## 2022-06-25 DIAGNOSIS — L309 Dermatitis, unspecified: Secondary | ICD-10-CM | POA: Diagnosis not present

## 2022-06-25 MED ORDER — METHYLPREDNISOLONE SODIUM SUCC 125 MG IJ SOLR
40.0000 mg | Freq: Once | INTRAMUSCULAR | Status: AC
  Administered 2022-06-25: 40 mg via INTRAMUSCULAR

## 2022-06-25 NOTE — ED Triage Notes (Signed)
Pt reports itching, burning, painful rash around mouth x 4 days. Benadryl helps with sleep. Pt reports she is using a new makeup remover.

## 2022-06-25 NOTE — ED Provider Notes (Signed)
[facility]    CSN: [id number] Arrival date & time: 06/25/22  1555      History   Chief Complaint Chief Complaint  Patient presents with   Allergic Reaction    Not sure if it a reaction but lips are broke out and red and itchy. - Entered by patient   Rash    HPI Jeanette Aguirre is a 61 y.o. female.   Patient presenting today with itchy rash around upper lip region for the past 4 days.  She denies rash elsewhere, itching or lesions to the mouth, throat itching or swelling, difficulty breathing or swallowing, nausea, vomiting, chest tightness, shortness of breath.  She has been using a new make-up remover wipe but otherwise not trying anything new to this area and not on any new medications or supplements, not trying new foods.  Taking Benadryl to help with sleep, otherwise not tried anything over-the-counter for symptoms.    Past Medical History:  Diagnosis Date   Anxiety    Depression    Diabetes mellitus without complication ([place])    Family history of adverse reaction to anesthesia    Gastroparesis    GERD (gastroesophageal reflux disease)    Hyperlipemia    Pneumonia    PONV (postoperative nausea and vomiting)     Patient Active Problem List   Diagnosis Date Noted   IDA (iron deficiency anemia) 07/03/2021   Anemia 11/21/2020   B12 deficiency 11/21/2020   H/O adenomatous polyp of colon 04/23/2020   Lipoma of right lower extremity    Fatty liver 05/10/2019   GERD (gastroesophageal reflux disease) 08/23/2018   Bloating 08/23/2018   Normocytic anemia 03/18/2018   Chronic diarrhea 11/27/2017   Nausea with vomiting 11/27/2017   Hepatomegaly 11/27/2017   Flu 10/02/2016   Community acquired pneumonia 10/01/2016   Smoker 10/01/2016   Hypokalemia 10/01/2016   Acute respiratory failure with hypoxemia ([place]) 10/01/2016    Past Surgical History:  Procedure Laterality Date   ADENOIDECTOMY     BIOPSY  08/19/2021   Procedure: BIOPSY;  Surgeon: Daneil Dolin,  MD;  [facility];  Service: Endoscopy;;   CHOLECYSTECTOMY     COLONOSCOPY N/A 09/04/2017   Dr. Gala Romney: multiple tubular adenomas removed, diverticulosis. next TCS in 3 years.    COLONOSCOPY N/A 01/18/2021   Procedure: COLONOSCOPY;  Surgeon: Daneil Dolin, MD;  [facility];  Service: Endoscopy;  Laterality: N/A;  AM (diabetic)   ESOPHAGOGASTRODUODENOSCOPY N/A 04/09/2018   Dr. Gala Romney: inflammation of esophagus c/w acid reflux.    ESOPHAGOGASTRODUODENOSCOPY (EGD) WITH PROPOFOL N/A 08/19/2021   Procedure: ESOPHAGOGASTRODUODENOSCOPY (EGD) WITH PROPOFOL;  Surgeon: Daneil Dolin, MD;  [facility];  Service: Endoscopy;  Laterality: N/A;  11:15am   LIPOMA EXCISION Right 03/07/2020   Procedure: EXCISION LIPOMA, THIGH;  Surgeon: Aviva Signs, MD;  Location: [facility];  Service: General;  Laterality: Right;   POLYPECTOMY  01/18/2021   Procedure: POLYPECTOMY;  Surgeon: Daneil Dolin, MD;  [facility];  Service: Endoscopy;;   TONSILLECTOMY     and adenoids   TUBAL LIGATION     TYMPANOSTOMY TUBE PLACEMENT      OB History   No obstetric history on file.      Home Medications    Prior to Admission medications   Medication Sig Start Date End Date Taking? Authorizing Provider  atorvastatin (LIPITOR) 20 MG tablet Take 20 mg by mouth at bedtime.     [provider]  cetirizine (ZYRTEC) 10 MG tablet Take 10 mg by mouth at bedtime.    [provider]  FLUoxetine (PROZAC) 40 MG capsule Take 40 mg by mouth in the morning.  11/21/19   [provider]  glimepiride (AMARYL) 4 MG tablet Take 4 mg by mouth 2 (two) times daily with breakfast and lunch.    [provider]  hyoscyamine (LEVSIN) 0.125 MG tablet Take 1 tablet (0.125 mg total) by mouth every 4 (four) hours as needed (for onset of vomiting/diarrhea episodes). 10/04/21   Mahala Menghini, PA-C  JARDIANCE 25 MG TABS tablet Take 25 mg by mouth every morning. 11/12/20    [provider]  ondansetron (ZOFRAN ODT) 4 MG disintegrating tablet Take 1 tablet (4 mg total) by mouth every 6 (six) hours as needed for nausea or vomiting. 05/29/21   Mahala Menghini, PA-C  pantoprazole (PROTONIX) 40 MG tablet Take 1 tablet (40 mg total) by mouth 2 (two) times daily before a meal. For one month, then go back to once daily. 08/30/21   Mahala Menghini, PA-C  promethazine (PHENERGAN) 25 MG tablet Take 1/2 to 1 tablet every 6 hours as needed for nausea/vomiting 06/05/21   Erenest Rasher, PA-C  QUEtiapine (SEROQUEL) 50 MG tablet Take 50 mg by mouth at bedtime as needed for sleep. 07/08/21   [provider]  sitaGLIPtin-metformin (JANUMET) 50-1000 MG tablet Take 1 tablet by mouth 2 (two) times daily with a meal.    [provider]    Family History Family History  Problem Relation Age of Onset   Diabetes Mother    Peripheral vascular disease Mother    Diabetes Father    Colitis Daughter 8       lymphocytic colitis   Colon cancer Neg Hx     Social History Social History   Tobacco Use   Smoking status: Former    Packs/day: 1.00    Years: 15.00    Total pack years: 15.00    Types: Cigarettes    Quit date: 09/22/2016    Years since quitting: 5.7   Smokeless tobacco: Never  Vaping Use   Vaping Use: Never used  Substance Use Topics   Alcohol use: Yes    Comment: very little   Drug use: No     Allergies   Lisinopril, Betadine [povidone iodine], Povidone-iodine, and Ciprofloxacin   Review of Systems Review of Systems Per HPI  Physical Exam Triage Vital Signs ED Triage Vitals  Enc Vitals Group     BP 06/25/22 1610 136/77     Pulse Rate 06/25/22 1610 79     Resp 06/25/22 1610 16     Temp 06/25/22 1610 98.8 F (37.1 C)     Temp Source 06/25/22 1610 Oral     SpO2 06/25/22 1610 95 %     Weight --      Height --      Head Circumference --      Peak Flow --      Pain Score 06/25/22 1608 5     Pain Loc --      Pain Edu? --       Excl. in [place]? --    No data found.  Updated Vital Signs BP 136/77 (BP Location: Right Arm)   Pulse 79   Temp 98.8 F (37.1 C) (Oral)   Resp 16   LMP 03/03/2012   SpO2 95%   Visual Acuity Right Eye Distance:   Left  Eye Distance:   Bilateral Distance:    Right Eye Near:   Left Eye Near:    Bilateral Near:     Physical Exam Vitals and nursing note reviewed.  Constitutional:      Appearance: Normal appearance. She is not ill-appearing.  HENT:     Head: Atraumatic.     Mouth/Throat:     Mouth: Mucous membranes are moist.     Pharynx: Oropharynx is clear.  Eyes:     Extraocular Movements: Extraocular movements intact.     Conjunctiva/sclera: Conjunctivae normal.  Cardiovascular:     Rate and Rhythm: Normal rate and regular rhythm.     Heart sounds: Normal heart sounds.  Pulmonary:     Effort: Pulmonary effort is normal.     Breath sounds: Normal breath sounds.  Musculoskeletal:        General: Normal range of motion.     Cervical back: Normal range of motion and neck supple.  Skin:    General: Skin is warm and dry.     Findings: Rash present.     Comments: Erythematous minimally edematous region above the upper lip bilaterally, mild angular cheilitis to the right corner of mouth, lips mildly chapped but not swollen  Neurological:     Mental Status: She is alert and oriented to person, place, and time.  Psychiatric:        Mood and Affect: Mood normal.        Thought Content: Thought content normal.        Judgment: Judgment normal.      UC Treatments / Results  Labs (all labs ordered are listed, but only abnormal results are displayed) Labs Reviewed - No data to display  EKG   Radiology No results found.  Procedures Procedures (including critical care time)  Medications Ordered in UC Medications  methylPREDNISolone sodium succinate (SOLU-MEDROL) 125 mg/2 mL injection 40 mg (40 mg Intramuscular Given 06/25/22 1635)    Initial Impression / Assessment  and Plan / UC Course  I have reviewed the triage vital signs and the nursing notes.  Pertinent labs & imaging results that were available during my care of the patient were reviewed by me and considered in my medical decision making (see chart for details).     Given location of symptoms, will treat with low-dose IM Solu-Medrol and hydrocortisone cream, Aquaphor to the area.  Avoid any use of new products, including the new make-up wipes in case this is causing the irritant reaction.  Return for any worsening symptoms.  Final Clinical Impressions(s) / UC Diagnoses   Final diagnoses:  Facial dermatitis     Discharge Instructions      May apply hydrocortisone to the itchy, irritated area around your mouth and Aquaphor for the lips.  May apply some Neosporin twice daily to the cracked corners of your mouth to keep it from getting infected.  Your blood sugars closely for the next few days because of the steroid shot.    ED Prescriptions   None    PDMP not reviewed this encounter.   Volney American, [place] 06/25/22 1636

## 2022-06-25 NOTE — Discharge Instructions (Signed)
May apply hydrocortisone to the itchy, irritated area around your mouth and Aquaphor for the lips.  May apply some Neosporin twice daily to the cracked corners of your mouth to keep it from getting infected.  Your blood sugars closely for the next few days because of the steroid shot.

## 2022-06-26 ENCOUNTER — Other Ambulatory Visit (HOSPITAL_COMMUNITY): Payer: Self-pay | Admitting: Internal Medicine

## 2022-06-26 DIAGNOSIS — Z1231 Encounter for screening mammogram for malignant neoplasm of breast: Secondary | ICD-10-CM

## 2022-06-30 ENCOUNTER — Ambulatory Visit (HOSPITAL_COMMUNITY)
Admission: RE | Admit: 2022-06-30 | Discharge: 2022-06-30 | Disposition: A | Discharge status: Home / Self Care | Payer: PRIVATE HEALTH INSURANCE | Source: Ambulatory Visit | Attending: Internal Medicine | Admitting: Internal Medicine

## 2022-06-30 DIAGNOSIS — Z1231 Encounter for screening mammogram for malignant neoplasm of breast: Secondary | ICD-10-CM | POA: Diagnosis not present

## 2023-02-13 ENCOUNTER — Ambulatory Visit
Admission: EM | Admit: 2023-02-13 | Discharge: 2023-02-13 | Disposition: A | Discharge status: Home / Self Care | Payer: PRIVATE HEALTH INSURANCE

## 2023-02-13 DIAGNOSIS — J069 Acute upper respiratory infection, unspecified: Secondary | ICD-10-CM | POA: Insufficient documentation

## 2023-02-13 LAB — POCT RAPID STREP A (OFFICE): Rapid Strep A Screen: NEGATIVE

## 2023-02-13 MED ORDER — CETIRIZINE-PSEUDOEPHEDRINE ER 5-120 MG PO TB12: 1.0000 | ORAL_TABLET | Freq: Every day | ORAL | 0 refills | Status: DC

## 2023-02-13 MED ORDER — FLUTICASONE PROPIONATE 50 MCG/ACT NA SUSP: 2.0000 | Freq: Every day | NASAL | 0 refills | Status: DC

## 2023-02-13 MED ORDER — PROMETHAZINE-DM 6.25-15 MG/5ML PO SYRP: 5.0000 mL | ORAL_SOLUTION | Freq: Four times a day (QID) | ORAL | 0 refills | Status: DC | PRN

## 2023-02-13 NOTE — ED Triage Notes (Signed)
Pt c/o chest congestion, cough and left ear  pain x 1 day

## 2023-02-13 NOTE — Discharge Instructions (Signed)
The rapid strep test was negative. A throat culture is pending. You will be notified if the results are positive to provide treatment.  Take medication as prescribed. Increase fluids and allow for plenty of rest. Recommend Tylenol or ibuprofen as needed for pain, fever, or general discomfort. Warm salt water gargles 3-4 times daily to help with throat pain or discomfort. Recommend using a humidifier at bedtime during sleep to help with cough and nasal congestion. Sleep elevated on 2 pillows. Follow-up if your symptoms do not improve within 10 to 14 days, or sooner if symptoms worsen. Follow-up as needed.Marland Kitchen

## 2023-02-13 NOTE — ED Provider Notes (Signed)
[facility]    CSN: [id number] Arrival date & time: 02/13/23  1515      History   Chief Complaint No chief complaint on file.   HPI Jeanette Aguirre is a 62 y.o. female.   The history is provided by the patient.   Patient presents for complaints of cough, chest congestion, and left ear pain.  Symptoms have been present for the past 24 hours.  Patient denies fever, chills, headache, ear drainage, wheezing, shortness of breath, difficulty breathing, chest pain, abdominal pain, nausea, vomiting, or diarrhea.  Patient reports that she took Benadryl, and tried 2 shots of "whiskey".  She denies any obvious known sick contacts.  Past Medical History:  Diagnosis Date   Anxiety    Depression    Diabetes mellitus without complication ([facility])    Family history of adverse reaction to anesthesia    Gastroparesis    GERD (gastroesophageal reflux disease)    Hyperlipemia    Pneumonia    PONV (postoperative nausea and vomiting)     Patient Active Problem List   Diagnosis Date Noted   IDA (iron deficiency anemia) 07/03/2021   Anemia 11/21/2020   B12 deficiency 11/21/2020   H/O adenomatous polyp of colon 04/23/2020   Lipoma of right lower extremity    Fatty liver 05/10/2019   GERD (gastroesophageal reflux disease) 08/23/2018   Bloating 08/23/2018   Normocytic anemia 03/18/2018   Chronic diarrhea 11/27/2017   Nausea with vomiting 11/27/2017   Hepatomegaly 11/27/2017   Flu 10/02/2016   Community acquired pneumonia 10/01/2016   Smoker 10/01/2016   Hypokalemia 10/01/2016   Acute respiratory failure with hypoxemia ([facility]) 10/01/2016    Past Surgical History:  Procedure Laterality Date   ADENOIDECTOMY     BIOPSY  08/19/2021   Procedure: BIOPSY;  Surgeon: Corbin Ade, MD;  [facility];  Service: Endoscopy;;   CHOLECYSTECTOMY     COLONOSCOPY N/A 09/04/2017   Dr. Jena Gauss: multiple tubular adenomas removed, diverticulosis. next TCS in 3 years.    COLONOSCOPY N/A  01/18/2021   Procedure: COLONOSCOPY;  Surgeon: Corbin Ade, MD;  [facility];  Service: Endoscopy;  Laterality: N/A;  AM (diabetic)   ESOPHAGOGASTRODUODENOSCOPY N/A 04/09/2018   Dr. Jena Gauss: inflammation of esophagus c/w acid reflux.    ESOPHAGOGASTRODUODENOSCOPY (EGD) WITH PROPOFOL N/A 08/19/2021   Procedure: ESOPHAGOGASTRODUODENOSCOPY (EGD) WITH PROPOFOL;  Surgeon: Corbin Ade, MD;  [facility];  Service: Endoscopy;  Laterality: N/A;  11:15am   LIPOMA EXCISION Right 03/07/2020   Procedure: EXCISION LIPOMA, THIGH;  Surgeon: Franky Macho, MD;  Location: [facility];  Service: General;  Laterality: Right;   POLYPECTOMY  01/18/2021   Procedure: POLYPECTOMY;  Surgeon: Corbin Ade, MD;  [facility];  Service: Endoscopy;;   TONSILLECTOMY     and adenoids   TUBAL LIGATION     TYMPANOSTOMY TUBE PLACEMENT      OB History   No obstetric history on file.      Home Medications    Prior to Admission medications   Medication Sig Start Date End Date Taking? Authorizing Provider  DULoxetine (CYMBALTA) 30 MG capsule Take 30 mg by mouth daily. 01/12/23  Yes [provider]  atorvastatin (LIPITOR) 20 MG tablet Take 20 mg by mouth at bedtime.     [provider]  cetirizine (ZYRTEC) 10 MG tablet Take 10 mg by mouth at bedtime.    [provider]  cetirizine-pseudoephedrine (ZYRTEC-D) 5-120 MG tablet Take  1 tablet by mouth daily. 02/13/23  Yes Paislie Tessler-Warren, [name], NP  FLUoxetine (PROZAC) 40 MG capsule Take 40 mg by mouth in the morning.  11/21/19   [provider]  fluticasone (FLONASE) 50 MCG/ACT nasal spray Place 2 sprays into both nostrils daily. 02/13/23  Yes Jona Erkkila-Warren, [name], NP  glimepiride (AMARYL) 4 MG tablet Take 4 mg by mouth 2 (two) times daily with breakfast and lunch.    [provider]  hyoscyamine (LEVSIN) 0.125 MG tablet Take 1 tablet (0.125 mg total) by mouth every 4 (four) hours as needed  (for onset of vomiting/diarrhea episodes). 10/04/21   Tiffany Kocher, PA-C  JARDIANCE 25 MG TABS tablet Take 25 mg by mouth every morning. 11/12/20   [provider]  ondansetron (ZOFRAN ODT) 4 MG disintegrating tablet Take 1 tablet (4 mg total) by mouth every 6 (six) hours as needed for nausea or vomiting. 05/29/21   Tiffany Kocher, PA-C  pantoprazole (PROTONIX) 40 MG tablet Take 1 tablet (40 mg total) by mouth 2 (two) times daily before a meal. For one month, then go back to once daily. 08/30/21   Tiffany Kocher, PA-C  promethazine (PHENERGAN) 25 MG tablet Take 1/2 to 1 tablet every 6 hours as needed for nausea/vomiting 06/05/21   Letta Median, PA-C  promethazine-dextromethorphan (PROMETHAZINE-DM) 6.25-15 MG/5ML syrup Take 5 mLs by mouth 4 (four) times daily as needed for cough. 02/13/23  Yes Saqib Cazarez-Warren, [name], NP  QUEtiapine (SEROQUEL) 50 MG tablet Take 50 mg by mouth at bedtime as needed for sleep. 07/08/21   [provider]  sitaGLIPtin-metformin (JANUMET) 50-1000 MG tablet Take 1 tablet by mouth 2 (two) times daily with a meal.    [provider]    Family History Family History  Problem Relation Age of Onset   Diabetes Mother    Peripheral vascular disease Mother    Diabetes Father    Colitis Daughter 1       lymphocytic colitis   Colon cancer Neg Hx     Social History Social History   Tobacco Use   Smoking status: Former    Packs/day: 1.00    Years: 15.00    Additional pack years: 0.00    Total pack years: 15.00    Types: Cigarettes    Quit date: 09/22/2016    Years since quitting: 6.3   Smokeless tobacco: Never  Vaping Use   Vaping Use: Never used  Substance Use Topics   Alcohol use: Yes    Comment: very little   Drug use: No     Allergies   Lisinopril, Betadine [povidone iodine], Povidone-iodine, and Ciprofloxacin   Review of Systems Review of Systems Per HPI  Physical Exam Triage Vital Signs ED Triage Vitals  Enc  Vitals Group     BP 02/13/23 1539 134/78     Pulse Rate 02/13/23 1539 77     Resp 02/13/23 1539 16     Temp 02/13/23 1539 97.9 F (36.6 C)     Temp Source 02/13/23 1539 Oral     SpO2 02/13/23 1539 94 %     Weight --      Height --      Head Circumference --      Peak Flow --      Pain Score 02/13/23 1541 4     Pain Loc --      Pain Edu? --      Excl. in [place]? --    No  data found.  Updated Vital Signs BP 134/78 (BP Location: Right Arm)   Pulse 77   Temp 97.9 F (36.6 C) (Oral)   Resp 16   LMP 03/03/2012   SpO2 94%   Visual Acuity Right Eye Distance:   Left Eye Distance:   Bilateral Distance:    Right Eye Near:   Left Eye Near:    Bilateral Near:     Physical Exam Vitals and nursing note reviewed.  Constitutional:      Appearance: Normal appearance. She is well-developed.  HENT:     Head: Normocephalic and atraumatic.     Right Ear: Tympanic membrane, ear canal and external ear normal.     Left Ear: Ear canal and external ear normal. A middle ear effusion is present.     Nose: Congestion present.     Right Turbinates: Enlarged and swollen.     Left Turbinates: Enlarged and swollen.     Right Sinus: No maxillary sinus tenderness or frontal sinus tenderness.     Left Sinus: No maxillary sinus tenderness or frontal sinus tenderness.     Mouth/Throat:     Lips: Pink.     Mouth: Mucous membranes are moist.     Pharynx: Oropharynx is clear. Uvula midline. Posterior oropharyngeal erythema present. No pharyngeal swelling, oropharyngeal exudate or uvula swelling.     Comments: Cobblestoning present on posterior oropharynx Eyes:     Conjunctiva/sclera: Conjunctivae normal.     Pupils: Pupils are equal, round, and reactive to light.  Neck:     Thyroid: No thyromegaly.     Trachea: No tracheal deviation.  Cardiovascular:     Rate and Rhythm: Normal rate and regular rhythm.     Pulses: Normal pulses.     Heart sounds: Normal heart sounds.  Pulmonary:     Effort:  Pulmonary effort is normal.     Breath sounds: Normal breath sounds.  Abdominal:     General: Bowel sounds are normal. There is no distension.     Palpations: Abdomen is soft.     Tenderness: There is no abdominal tenderness.  Musculoskeletal:     Cervical back: Normal range of motion and neck supple.  Skin:    General: Skin is warm and dry.  Neurological:     General: No focal deficit present.     Mental Status: She is alert and oriented to person, place, and time.  Psychiatric:        Mood and Affect: Mood normal.        Behavior: Behavior normal.        Thought Content: Thought content normal.        Judgment: Judgment normal.      UC Treatments / Results  Labs (all labs ordered are listed, but only abnormal results are displayed) Labs Reviewed  POCT RAPID STREP A (OFFICE) - Normal  CULTURE, GROUP A STREP [facility])    EKG   Radiology No results found.  Procedures Procedures (including critical care time)  Medications Ordered in UC Medications - No data to display  Initial Impression / Assessment and Plan / UC Course  I have reviewed the triage vital signs and the nursing notes.  Pertinent labs & imaging results that were available during my care of the patient were reviewed by me and considered in my medical decision making (see chart for details).  The patient is well-appearing, she is in no acute distress, vital signs are stable.  Rapid strep test is negative, throat  culture is pending.  Patient declined further viral testing.  Suspect a viral upper respiratory infection with cough and a left middle ear effusion.  Will treat symptomatically with promethazine DM (patient reports that she has taken dextromethorphan and Phenergan without difficulty, reports that she did have an allergy to topical Betadine several years ago), Zyrtec D 5/120 mg tablets to help with nasal congestion and left middle ear effusion, and fluticasone 50 mcg nasal spray to help with nasal  congestion and left middle ear effusion.  Supportive care recommendations were provided and discussed with the patient to include over-the-counter analgesics for pain or discomfort, warm salt water gargles, and use of a humidifier in her bedroom at nighttime during sleep.  Patient was given follow-up precautions.  Patient is in agreement with this plan of care and verbalizes understanding.  All questions were answered.  Patient stable for discharge.  Final Clinical Impressions(s) / UC Diagnoses   Final diagnoses:  Viral upper respiratory tract infection with cough     Discharge Instructions      The rapid strep test was negative. A throat culture is pending. You will be notified if the results are positive to provide treatment.  Take medication as prescribed. Increase fluids and allow for plenty of rest. Recommend Tylenol or ibuprofen as needed for pain, fever, or general discomfort. Warm salt water gargles 3-4 times daily to help with throat pain or discomfort. Recommend using a humidifier at bedtime during sleep to help with cough and nasal congestion. Sleep elevated on 2 pillows. Follow-up if your symptoms do not improve within 10 to 14 days, or sooner if symptoms worsen. Follow-up as needed..      ED Prescriptions     Medication Sig Dispense Auth. Provider   fluticasone (FLONASE) 50 MCG/ACT nasal spray Place 2 sprays into both nostrils daily. 16 g Morna Flud-Warren, [name], NP   cetirizine-pseudoephedrine (ZYRTEC-D) 5-120 MG tablet Take 1 tablet by mouth daily. 30 tablet Dayshaun Whobrey-Warren, [name], NP   promethazine-dextromethorphan (PROMETHAZINE-DM) 6.25-15 MG/5ML syrup Take 5 mLs by mouth 4 (four) times daily as needed for cough. 118 mL Hanny Elsberry-Warren, [name], NP      PDMP not reviewed this encounter.   Abran Cantor, NP 02/13/23 [phone]

## 2023-02-14 LAB — CULTURE, GROUP A STREP (THRC)

## 2023-02-16 LAB — CULTURE, GROUP A STREP (THRC)

## 2023-02-27 DIAGNOSIS — F419 Anxiety disorder, unspecified: Secondary | ICD-10-CM | POA: Diagnosis not present

## 2023-02-27 DIAGNOSIS — Z7984 Long term (current) use of oral hypoglycemic drugs: Secondary | ICD-10-CM | POA: Diagnosis not present

## 2023-02-27 DIAGNOSIS — E119 Type 2 diabetes mellitus without complications: Secondary | ICD-10-CM | POA: Diagnosis not present

## 2023-02-27 DIAGNOSIS — Z87891 Personal history of nicotine dependence: Secondary | ICD-10-CM | POA: Diagnosis not present

## 2023-02-27 DIAGNOSIS — E785 Hyperlipidemia, unspecified: Secondary | ICD-10-CM | POA: Diagnosis not present

## 2023-02-27 DIAGNOSIS — F325 Major depressive disorder, single episode, in full remission: Secondary | ICD-10-CM | POA: Diagnosis not present

## 2023-02-27 DIAGNOSIS — Z6836 Body mass index (BMI) 36.0-36.9, adult: Secondary | ICD-10-CM | POA: Diagnosis not present

## 2023-02-27 DIAGNOSIS — Z881 Allergy status to other antibiotic agents status: Secondary | ICD-10-CM | POA: Diagnosis not present

## 2023-02-27 DIAGNOSIS — R32 Unspecified urinary incontinence: Secondary | ICD-10-CM | POA: Diagnosis not present

## 2023-02-27 DIAGNOSIS — Z888 Allergy status to other drugs, medicaments and biological substances status: Secondary | ICD-10-CM | POA: Diagnosis not present

## 2023-03-23 DIAGNOSIS — Z419 Encounter for procedure for purposes other than remedying health state, unspecified: Secondary | ICD-10-CM | POA: Diagnosis not present

## 2023-04-01 DIAGNOSIS — H6692 Otitis media, unspecified, left ear: Secondary | ICD-10-CM | POA: Diagnosis not present

## 2023-04-01 DIAGNOSIS — E669 Obesity, unspecified: Secondary | ICD-10-CM | POA: Diagnosis not present

## 2023-04-01 DIAGNOSIS — Z6836 Body mass index (BMI) 36.0-36.9, adult: Secondary | ICD-10-CM | POA: Diagnosis not present

## 2023-04-16 DIAGNOSIS — Z6835 Body mass index (BMI) 35.0-35.9, adult: Secondary | ICD-10-CM | POA: Diagnosis not present

## 2023-04-16 DIAGNOSIS — E1165 Type 2 diabetes mellitus with hyperglycemia: Secondary | ICD-10-CM | POA: Diagnosis not present

## 2023-04-16 DIAGNOSIS — E6609 Other obesity due to excess calories: Secondary | ICD-10-CM | POA: Diagnosis not present

## 2023-04-23 DIAGNOSIS — Z419 Encounter for procedure for purposes other than remedying health state, unspecified: Secondary | ICD-10-CM | POA: Diagnosis not present

## 2023-04-24 DIAGNOSIS — Z6837 Body mass index (BMI) 37.0-37.9, adult: Secondary | ICD-10-CM | POA: Diagnosis not present

## 2023-04-24 DIAGNOSIS — U071 COVID-19: Secondary | ICD-10-CM | POA: Diagnosis not present

## 2023-04-24 DIAGNOSIS — E669 Obesity, unspecified: Secondary | ICD-10-CM | POA: Diagnosis not present

## 2023-05-24 DIAGNOSIS — Z419 Encounter for procedure for purposes other than remedying health state, unspecified: Secondary | ICD-10-CM | POA: Diagnosis not present

## 2023-06-23 DIAGNOSIS — Z419 Encounter for procedure for purposes other than remedying health state, unspecified: Secondary | ICD-10-CM | POA: Diagnosis not present

## 2023-06-24 DIAGNOSIS — I10 Essential (primary) hypertension: Secondary | ICD-10-CM | POA: Diagnosis not present

## 2023-06-24 DIAGNOSIS — J329 Chronic sinusitis, unspecified: Secondary | ICD-10-CM | POA: Diagnosis not present

## 2023-06-24 DIAGNOSIS — Z6836 Body mass index (BMI) 36.0-36.9, adult: Secondary | ICD-10-CM | POA: Diagnosis not present

## 2023-06-24 DIAGNOSIS — E1165 Type 2 diabetes mellitus with hyperglycemia: Secondary | ICD-10-CM | POA: Diagnosis not present

## 2023-07-20 DIAGNOSIS — E6609 Other obesity due to excess calories: Secondary | ICD-10-CM | POA: Diagnosis not present

## 2023-07-20 DIAGNOSIS — I10 Essential (primary) hypertension: Secondary | ICD-10-CM | POA: Diagnosis not present

## 2023-07-20 DIAGNOSIS — Z6836 Body mass index (BMI) 36.0-36.9, adult: Secondary | ICD-10-CM | POA: Diagnosis not present

## 2023-07-20 DIAGNOSIS — E1165 Type 2 diabetes mellitus with hyperglycemia: Secondary | ICD-10-CM | POA: Diagnosis not present

## 2023-07-24 DIAGNOSIS — Z419 Encounter for procedure for purposes other than remedying health state, unspecified: Secondary | ICD-10-CM | POA: Diagnosis not present

## 2023-08-10 DIAGNOSIS — Z6835 Body mass index (BMI) 35.0-35.9, adult: Secondary | ICD-10-CM | POA: Diagnosis not present

## 2023-08-10 DIAGNOSIS — I10 Essential (primary) hypertension: Secondary | ICD-10-CM | POA: Diagnosis not present

## 2023-08-10 DIAGNOSIS — Z0001 Encounter for general adult medical examination with abnormal findings: Secondary | ICD-10-CM | POA: Diagnosis not present

## 2023-08-10 DIAGNOSIS — Z1331 Encounter for screening for depression: Secondary | ICD-10-CM | POA: Diagnosis not present

## 2023-08-10 DIAGNOSIS — E1165 Type 2 diabetes mellitus with hyperglycemia: Secondary | ICD-10-CM | POA: Diagnosis not present

## 2023-08-10 DIAGNOSIS — E6609 Other obesity due to excess calories: Secondary | ICD-10-CM | POA: Diagnosis not present

## 2023-08-12 DIAGNOSIS — I10 Essential (primary) hypertension: Secondary | ICD-10-CM | POA: Diagnosis not present

## 2023-08-12 DIAGNOSIS — Z0001 Encounter for general adult medical examination with abnormal findings: Secondary | ICD-10-CM | POA: Diagnosis not present

## 2023-08-12 DIAGNOSIS — E1165 Type 2 diabetes mellitus with hyperglycemia: Secondary | ICD-10-CM | POA: Diagnosis not present

## 2023-08-23 DIAGNOSIS — Z419 Encounter for procedure for purposes other than remedying health state, unspecified: Secondary | ICD-10-CM | POA: Diagnosis not present

## 2023-08-25 DIAGNOSIS — E538 Deficiency of other specified B group vitamins: Secondary | ICD-10-CM | POA: Diagnosis not present

## 2023-09-23 DIAGNOSIS — Z419 Encounter for procedure for purposes other than remedying health state, unspecified: Secondary | ICD-10-CM | POA: Diagnosis not present

## 2023-10-18 DIAGNOSIS — S66912A Strain of unspecified muscle, fascia and tendon at wrist and hand level, left hand, initial encounter: Secondary | ICD-10-CM | POA: Diagnosis not present

## 2023-10-24 DIAGNOSIS — Z419 Encounter for procedure for purposes other than remedying health state, unspecified: Secondary | ICD-10-CM | POA: Diagnosis not present

## 2023-10-26 DIAGNOSIS — E1165 Type 2 diabetes mellitus with hyperglycemia: Secondary | ICD-10-CM | POA: Diagnosis not present

## 2023-10-26 DIAGNOSIS — Z0001 Encounter for general adult medical examination with abnormal findings: Secondary | ICD-10-CM | POA: Diagnosis not present

## 2023-10-26 DIAGNOSIS — Z1331 Encounter for screening for depression: Secondary | ICD-10-CM | POA: Diagnosis not present

## 2023-10-26 DIAGNOSIS — M720 Palmar fascial fibromatosis [Dupuytren]: Secondary | ICD-10-CM | POA: Diagnosis not present

## 2023-10-26 DIAGNOSIS — Z6837 Body mass index (BMI) 37.0-37.9, adult: Secondary | ICD-10-CM | POA: Diagnosis not present

## 2023-10-26 DIAGNOSIS — E6609 Other obesity due to excess calories: Secondary | ICD-10-CM | POA: Diagnosis not present

## 2023-10-26 DIAGNOSIS — I10 Essential (primary) hypertension: Secondary | ICD-10-CM | POA: Diagnosis not present

## 2023-10-29 DIAGNOSIS — E1165 Type 2 diabetes mellitus with hyperglycemia: Secondary | ICD-10-CM | POA: Diagnosis not present

## 2023-10-29 DIAGNOSIS — E538 Deficiency of other specified B group vitamins: Secondary | ICD-10-CM | POA: Diagnosis not present

## 2023-11-21 DIAGNOSIS — Z419 Encounter for procedure for purposes other than remedying health state, unspecified: Secondary | ICD-10-CM | POA: Diagnosis not present

## 2023-11-30 DIAGNOSIS — M720 Palmar fascial fibromatosis [Dupuytren]: Secondary | ICD-10-CM | POA: Diagnosis not present

## 2023-11-30 DIAGNOSIS — M79642 Pain in left hand: Secondary | ICD-10-CM | POA: Diagnosis not present

## 2023-12-02 DIAGNOSIS — E538 Deficiency of other specified B group vitamins: Secondary | ICD-10-CM | POA: Diagnosis not present

## 2024-01-02 DIAGNOSIS — Z419 Encounter for procedure for purposes other than remedying health state, unspecified: Secondary | ICD-10-CM | POA: Diagnosis not present

## 2024-01-05 DIAGNOSIS — E1165 Type 2 diabetes mellitus with hyperglycemia: Secondary | ICD-10-CM | POA: Diagnosis not present

## 2024-01-05 DIAGNOSIS — E538 Deficiency of other specified B group vitamins: Secondary | ICD-10-CM | POA: Diagnosis not present

## 2024-01-05 DIAGNOSIS — K2100 Gastro-esophageal reflux disease with esophagitis, without bleeding: Secondary | ICD-10-CM | POA: Diagnosis not present

## 2024-01-05 DIAGNOSIS — E6609 Other obesity due to excess calories: Secondary | ICD-10-CM | POA: Diagnosis not present

## 2024-01-05 DIAGNOSIS — Z6837 Body mass index (BMI) 37.0-37.9, adult: Secondary | ICD-10-CM | POA: Diagnosis not present

## 2024-01-05 DIAGNOSIS — M720 Palmar fascial fibromatosis [Dupuytren]: Secondary | ICD-10-CM | POA: Diagnosis not present

## 2024-01-05 DIAGNOSIS — I10 Essential (primary) hypertension: Secondary | ICD-10-CM | POA: Diagnosis not present

## 2024-01-07 ENCOUNTER — Encounter: Payer: Self-pay | Admitting: *Deleted

## 2024-01-12 ENCOUNTER — Encounter: Payer: Self-pay | Admitting: *Deleted

## 2024-01-15 DIAGNOSIS — M720 Palmar fascial fibromatosis [Dupuytren]: Secondary | ICD-10-CM | POA: Diagnosis not present

## 2024-01-15 DIAGNOSIS — M79642 Pain in left hand: Secondary | ICD-10-CM | POA: Diagnosis not present

## 2024-01-20 ENCOUNTER — Encounter: Payer: Self-pay | Admitting: Emergency Medicine

## 2024-01-20 ENCOUNTER — Other Ambulatory Visit: Payer: Self-pay

## 2024-01-20 ENCOUNTER — Ambulatory Visit (INDEPENDENT_AMBULATORY_CARE_PROVIDER_SITE_OTHER)

## 2024-01-20 ENCOUNTER — Ambulatory Visit: Admission: EM | Admit: 2024-01-20 | Discharge: 2024-01-20 | Disposition: A | Discharge status: Home / Self Care

## 2024-01-20 DIAGNOSIS — J069 Acute upper respiratory infection, unspecified: Secondary | ICD-10-CM

## 2024-01-20 DIAGNOSIS — Z87891 Personal history of nicotine dependence: Secondary | ICD-10-CM

## 2024-01-20 DIAGNOSIS — R051 Acute cough: Secondary | ICD-10-CM

## 2024-01-20 DIAGNOSIS — Z760 Encounter for issue of repeat prescription: Secondary | ICD-10-CM

## 2024-01-20 MED ORDER — ALBUTEROL SULFATE HFA 108 (90 BASE) MCG/ACT IN AERS: 1.0000 | INHALATION_SPRAY | Freq: Four times a day (QID) | RESPIRATORY_TRACT | 0 refills | Status: AC | PRN

## 2024-01-20 MED ORDER — PROMETHAZINE-DM 6.25-15 MG/5ML PO SYRP: 5.0000 mL | ORAL_SOLUTION | Freq: Every evening | ORAL | 0 refills | Status: DC | PRN

## 2024-01-20 NOTE — ED Provider Notes (Signed)
 [facility]    CSN: [id number] Arrival date & time: 01/20/24  1225      History   Chief Complaint Chief Complaint  Patient presents with   Nasal Congestion    HPI Jeanette Aguirre is a 63 y.o. female.   Patient presents today with 1 day history of wheezing, cough, chest pain when she coughs, constant back pain, postnasal drainage and sore throat, headache, decreased appetite, and fatigue.  She denies fever, body aches or chills, runny or stuffy nose, ear pain, abdominal pain, nausea/vomiting, and diarrhea.  No known sick contacts, however she works at a [employer].  Has taken Alka-Seltzer plus and Tylenol  for symptoms without much improvement.  Patient reports she has history of chronic lung disease and lost albuterol  inhaler and is requesting a refill today.  She is a former smoker.    Past Medical History:  Diagnosis Date   Anxiety    Depression    Diabetes mellitus without complication (HCC)    Family history of adverse reaction to anesthesia    Gastroparesis    GERD (gastroesophageal reflux disease)    Hyperlipemia    Pneumonia    PONV (postoperative nausea and vomiting)     Patient Active Problem List   Diagnosis Date Noted   IDA (iron deficiency anemia) 07/03/2021   Anemia 11/21/2020   B12 deficiency 11/21/2020   H/O adenomatous polyp of colon 04/23/2020   Lipoma of right lower extremity    Fatty liver 05/10/2019   GERD (gastroesophageal reflux disease) 08/23/2018   Bloating 08/23/2018   Normocytic anemia 03/18/2018   Chronic diarrhea 11/27/2017   Nausea with vomiting 11/27/2017   Hepatomegaly 11/27/2017   Flu 10/02/2016   Community acquired pneumonia 10/01/2016   Smoker 10/01/2016   Hypokalemia 10/01/2016   Acute respiratory failure with hypoxemia (HCC) 10/01/2016    Past Surgical History:  Procedure Laterality Date   ADENOIDECTOMY     BIOPSY  08/19/2021   Procedure: BIOPSY;  Surgeon: Suzette Espy, MD;  Location: [facility];   Service: Endoscopy;;   CHOLECYSTECTOMY     COLONOSCOPY N/A 09/04/2017   Dr. Riley Cheadle: multiple tubular adenomas removed, diverticulosis. next TCS in 3 years.    COLONOSCOPY N/A 01/18/2021   Procedure: COLONOSCOPY;  Surgeon: Suzette Espy, MD;  Location: [facility];  Service: Endoscopy;  Laterality: N/A;  AM (diabetic)   ESOPHAGOGASTRODUODENOSCOPY N/A 04/09/2018   Dr. Riley Cheadle: inflammation of esophagus c/w acid reflux.    ESOPHAGOGASTRODUODENOSCOPY (EGD) WITH PROPOFOL  N/A 08/19/2021   Procedure: ESOPHAGOGASTRODUODENOSCOPY (EGD) WITH PROPOFOL ;  Surgeon: Suzette Espy, MD;  Location: [facility];  Service: Endoscopy;  Laterality: N/A;  11:15am   LIPOMA EXCISION Right 03/07/2020   Procedure: EXCISION LIPOMA, THIGH;  Surgeon: Alanda Allegra, MD;  Location: [facility];  Service: General;  Laterality: Right;   POLYPECTOMY  01/18/2021   Procedure: POLYPECTOMY;  Surgeon: Suzette Espy, MD;  Location: [facility];  Service: Endoscopy;;   TONSILLECTOMY     and adenoids   TUBAL LIGATION     TYMPANOSTOMY TUBE PLACEMENT      OB History   No obstetric history on file.      Home Medications    Prior to Admission medications   Medication Sig Start Date End Date Taking? Authorizing Provider  albuterol  (VENTOLIN  HFA) 108 (90 Base) MCG/ACT inhaler Inhale 1-2 puffs into the lungs every 6 (six) hours as needed for wheezing or shortness of breath. 01/20/24  Yes Thena Fireman  A, NP  losartan (COZAAR) 25 MG tablet Take 1 tablet by mouth daily. 11/02/23  Yes [provider]  acarbose (PRECOSE) 50 MG tablet Take 50 mg by mouth 3 (three) times daily.    [provider]  atorvastatin  (LIPITOR) 20 MG tablet Take 20 mg by mouth at bedtime.     [provider]  cetirizine  (ZYRTEC ) 10 MG tablet Take 10 mg by mouth at bedtime.    [provider]  cetirizine -pseudoephedrine  (ZYRTEC -D) 5-120 MG tablet Take 1 tablet by mouth daily. Patient not taking: Reported on 01/20/2024  02/13/23   Leath-Warren, Belen Bowers, NP  DULoxetine (CYMBALTA) 60 MG capsule Take 60 mg by mouth.    [provider]  fluticasone  (FLONASE ) 50 MCG/ACT nasal spray Place 2 sprays into both nostrils daily. Patient not taking: Reported on 01/20/2024 02/13/23   Leath-Warren, Belen Bowers, NP  glimepiride  (AMARYL ) 4 MG tablet Take 4 mg by mouth 2 (two) times daily with breakfast and lunch.    [provider]  hyoscyamine  (LEVSIN) 0.125 MG tablet Take 1 tablet (0.125 mg total) by mouth every 4 (four) hours as needed (for onset of vomiting/diarrhea episodes). Patient not taking: Reported on 01/20/2024 10/04/21   Lanney Pitts, PA-C  JARDIANCE 25 MG TABS tablet Take 25 mg by mouth every morning. 11/12/20   [provider]  metoCLOPramide  (REGLAN ) 10 MG tablet Take 10 mg by mouth.    [provider]  ondansetron  (ZOFRAN  ODT) 4 MG disintegrating tablet Take 1 tablet (4 mg total) by mouth every 6 (six) hours as needed for nausea or vomiting. 05/29/21   Lanney Pitts, PA-C  pantoprazole  (PROTONIX ) 40 MG tablet Take 1 tablet (40 mg total) by mouth 2 (two) times daily before a meal. For one month, then go back to once daily. 08/30/21   Lanney Pitts, PA-C  promethazine  (PHENERGAN ) 25 MG tablet Take 1/2 to 1 tablet every 6 hours as needed for nausea/vomiting Patient not taking: Reported on 01/20/2024 06/05/21   Evander Hills, PA-C  promethazine -dextromethorphan (PROMETHAZINE -DM) 6.25-15 MG/5ML syrup Take 5 mLs by mouth at bedtime as needed for cough. Do not take with alcohol or while driving or operating heavy machinery.  May cause drowsiness. 01/20/24   Wilhemena Harbour, NP  QUEtiapine (SEROQUEL) 50 MG tablet Take 50 mg by mouth at bedtime as needed for sleep. 07/08/21   [provider]  sitaGLIPtin-metformin (JANUMET) 50-1000 MG tablet Take 1 tablet by mouth 2 (two) times daily with a meal.    [provider]    Family History Family History  Problem  Relation Age of Onset   Diabetes Mother    Peripheral vascular disease Mother    Diabetes Father    Colitis Daughter 35       lymphocytic colitis   Colon cancer Neg Hx     Social History Social History   Tobacco Use   Smoking status: Former    Current packs/day: 0.00    Average packs/day: 1 pack/day for 15.0 years (15.0 ttl pk-yrs)    Types: Cigarettes    Start date: 09/22/2001    Quit date: 09/22/2016    Years since quitting: 7.3   Smokeless tobacco: Never  Vaping Use   Vaping status: Never Used  Substance Use Topics   Alcohol use: Yes    Comment: very little   Drug use: No     Allergies   Lisinopril , Betadine  [povidone iodine ], Povidone-iodine , and Ciprofloxacin   Review of Systems  Review of Systems Per HPI  Physical Exam Triage Vital Signs ED Triage Vitals  Encounter Vitals Group     BP 01/20/24 1250 100/67     Systolic BP Percentile --      Diastolic BP Percentile --      Pulse Rate 01/20/24 1250 89     Resp 01/20/24 1250 20     Temp 01/20/24 1250 98.5 F (36.9 C)     Temp Source 01/20/24 1250 Oral     SpO2 01/20/24 1250 95 %     Weight --      Height --      Head Circumference --      Peak Flow --      Pain Score 01/20/24 1244 3     Pain Loc --      Pain Education --      Exclude from Growth Chart --    No data found.  Updated Vital Signs BP 100/67 (BP Location: Right Arm)   Pulse 89   Temp 98.5 F (36.9 C) (Oral)   Resp 20   LMP 03/03/2012   SpO2 95%   Visual Acuity Right Eye Distance:   Left Eye Distance:   Bilateral Distance:    Right Eye Near:   Left Eye Near:    Bilateral Near:     Physical Exam Vitals and nursing note reviewed.  Constitutional:      General: She is not in acute distress.    Appearance: Normal appearance. She is not ill-appearing or toxic-appearing.  HENT:     Head: Normocephalic and atraumatic.     Right Ear: Tympanic membrane, ear canal and external ear normal.     Left Ear: Tympanic membrane, ear canal  and external ear normal.     Nose: No congestion or rhinorrhea.     Mouth/Throat:     Mouth: Mucous membranes are moist.     Pharynx: Oropharynx is clear. Postnasal drip present. No oropharyngeal exudate or posterior oropharyngeal erythema.  Eyes:     General: No scleral icterus.    Extraocular Movements: Extraocular movements intact.  Cardiovascular:     Rate and Rhythm: Normal rate and regular rhythm.  Pulmonary:     Effort: Pulmonary effort is normal. No respiratory distress.     Breath sounds: Normal breath sounds. No wheezing, rhonchi or rales.  Musculoskeletal:     Cervical back: Normal range of motion and neck supple.  Lymphadenopathy:     Cervical: No cervical adenopathy.  Skin:    General: Skin is warm and dry.     Coloration: Skin is not jaundiced or pale.     Findings: No erythema or rash.  Neurological:     Mental Status: She is alert and oriented to person, place, and time.  Psychiatric:        Behavior: Behavior is cooperative.      UC Treatments / Results  Labs (all labs ordered are listed, but only abnormal results are displayed) Labs Reviewed - No data to display  EKG   Radiology DG Chest 2 View Result Date: 01/20/2024 EXAM: 2 VIEW(S) XRAY OF THE CHEST 01/20/2024 01:02:31 PM COMPARISON: 07/03/2017 CLINICAL HISTORY: Cough and back pain. FINDINGS: LUNGS AND PLEURA: No consolidation. No pulmonary edema. No pleural effusion. No pneumothorax. HEART AND MEDIASTINUM: No acute abnormality of the cardiac and mediastinal silhouettes. BONES AND SOFT TISSUES: No acute osseous abnormality. IMPRESSION: 1. No active disease. Electronically signed by: Karlyn Overman MD 01/20/2024 01:13 PM EDT RP Workstation:  [id number]    Procedures Procedures (including critical care time)  Medications Ordered in UC Medications - No data to display  Initial Impression / Assessment and Plan / UC Course  I have reviewed the triage vital signs and the nursing notes.  Pertinent labs &  imaging results that were available during my care of the patient were reviewed by me and considered in my medical decision making (see chart for details).   Patient is well-appearing, normotensive, afebrile, not tachycardic, not tachypneic, oxygenating well on room air.    1. Acute cough 2. Viral URI with cough 3. Medication refill 4. History of smoking Suspect viral etiology Patient declines viral testing today Vitals and exam are stable today Chest x-ray is negative for pneumonia Supportive care discussed with patient, start cough suppressant medication and guaifenesin  Refill given for inhaler to have on hand if she develops wheezing or shortness of breath Return and ER precautions discussed with patient Work excuse provided  The patient was given the opportunity to ask questions.  All questions answered to their satisfaction.  The patient is in agreement to this plan.   Final Clinical Impressions(s) / UC Diagnoses   Final diagnoses:  Acute cough  Viral URI with cough  Medication refill  History of smoking     Discharge Instructions      You have a viral upper respiratory infection.  Symptoms should improve over the next week to 10 days.  If you develop chest pain or shortness of breath, go to the emergency room.  Chest xray is negative for pneumonia today.  Some things that can make you feel better are: - Increased rest - Increasing fluid with water /sugar free electrolytes - Acetaminophen  and ibuprofen  as needed for fever/pain - Salt water  gargling, chloraseptic spray and throat lozenges - OTC guaifenesin  (Mucinex ) 600 mg twice daily for congestion - Saline sinus flushes or a neti pot - Humidifying the air - cough syrup at night time as needed for cough     ED Prescriptions     Medication Sig Dispense Auth. Provider   promethazine -dextromethorphan (PROMETHAZINE -DM) 6.25-15 MG/5ML syrup Take 5 mLs by mouth at bedtime as needed for cough. Do not take with alcohol  or while driving or operating heavy machinery.  May cause drowsiness. 118 mL Thena Fireman A, NP   albuterol  (VENTOLIN  HFA) 108 (90 Base) MCG/ACT inhaler Inhale 1-2 puffs into the lungs every 6 (six) hours as needed for wheezing or shortness of breath. 6.7 g Wilhemena Harbour, NP      PDMP not reviewed this encounter.   Wilhemena Harbour, NP 01/20/24 [phone]

## 2024-01-20 NOTE — ED Triage Notes (Signed)
 Pt reports nasal congestion and drainage, chills, cough, and back pain since yesterday. Reports hx of pneumonia. Has an inhaler but reports has been unable to find it since symptoms started.

## 2024-01-20 NOTE — Discharge Instructions (Signed)
 You have a viral upper respiratory infection.  Symptoms should improve over the next week to 10 days.  If you develop chest pain or shortness of breath, go to the emergency room.  Chest xray is negative for pneumonia today.  Some things that can make you feel better are: - Increased rest - Increasing fluid with water /sugar free electrolytes - Acetaminophen  and ibuprofen  as needed for fever/pain - Salt water  gargling, chloraseptic spray and throat lozenges - OTC guaifenesin  (Mucinex ) 600 mg twice daily for congestion - Saline sinus flushes or a neti pot - Humidifying the air - cough syrup at night time as needed for cough

## 2024-01-23 ENCOUNTER — Other Ambulatory Visit: Payer: Self-pay

## 2024-01-23 ENCOUNTER — Encounter (HOSPITAL_COMMUNITY): Payer: Self-pay | Admitting: Emergency Medicine

## 2024-01-23 ENCOUNTER — Emergency Department (HOSPITAL_COMMUNITY)

## 2024-01-23 ENCOUNTER — Emergency Department (HOSPITAL_COMMUNITY)
Admission: EM | Admit: 2024-01-23 | Discharge: 2024-01-23 | Disposition: A | Discharge status: Home / Self Care | Attending: Emergency Medicine | Admitting: Emergency Medicine

## 2024-01-23 DIAGNOSIS — J209 Acute bronchitis, unspecified: Secondary | ICD-10-CM | POA: Insufficient documentation

## 2024-01-23 DIAGNOSIS — R0602 Shortness of breath: Secondary | ICD-10-CM | POA: Diagnosis not present

## 2024-01-23 DIAGNOSIS — E876 Hypokalemia: Secondary | ICD-10-CM | POA: Insufficient documentation

## 2024-01-23 DIAGNOSIS — R059 Cough, unspecified: Secondary | ICD-10-CM | POA: Diagnosis present

## 2024-01-23 LAB — CBC WITH DIFFERENTIAL/PLATELET
Abs Immature Granulocytes: 0.02 10*3/uL (ref 0.00–0.07)
Basophils Absolute: 0 10*3/uL (ref 0.0–0.1)
Basophils Relative: 0 %
Eosinophils Absolute: 0.1 10*3/uL (ref 0.0–0.5)
Eosinophils Relative: 2 %
HCT: 37.9 % (ref 36.0–46.0)
Hemoglobin: 11.6 g/dL — ABNORMAL LOW (ref 12.0–15.0)
Immature Granulocytes: 0 %
Lymphocytes Relative: 23 %
Lymphs Abs: 1.4 10*3/uL (ref 0.7–4.0)
MCH: 25.4 pg — ABNORMAL LOW (ref 26.0–34.0)
MCHC: 30.6 g/dL (ref 30.0–36.0)
MCV: 82.9 fL (ref 80.0–100.0)
Monocytes Absolute: 0.3 10*3/uL (ref 0.1–1.0)
Monocytes Relative: 4 %
Neutro Abs: 4.2 10*3/uL (ref 1.7–7.7)
Neutrophils Relative %: 71 %
Platelets: 293 10*3/uL (ref 150–400)
RBC: 4.57 MIL/uL (ref 3.87–5.11)
RDW: 14.6 % (ref 11.5–15.5)
WBC: 5.9 10*3/uL (ref 4.0–10.5)
nRBC: 0 % (ref 0.0–0.2)

## 2024-01-23 LAB — COMPREHENSIVE METABOLIC PANEL WITH GFR
ALT: 18 U/L (ref 0–44)
AST: 18 U/L (ref 15–41)
Albumin: 3.5 g/dL (ref 3.5–5.0)
Alkaline Phosphatase: 99 U/L (ref 38–126)
Anion gap: 15 (ref 5–15)
BUN: 8 mg/dL (ref 6–20)
CO2: 25 mmol/L (ref 22–32)
Calcium: 9.1 mg/dL (ref 8.9–10.3)
Chloride: 97 mmol/L — ABNORMAL LOW (ref 98–111)
Creatinine, Ser: 0.68 mg/dL (ref 0.44–1.00)
GFR, Estimated: >60 mL/min (ref >60)
Glucose, Bld: 228 mg/dL — ABNORMAL HIGH (ref 70–99)
Potassium: 2.8 mmol/L — ABNORMAL LOW (ref 3.5–5.1)
Sodium: 137 mmol/L (ref 135–145)
Total Bilirubin: 0.4 mg/dL (ref 0.0–1.2)
Total Protein: 7.2 g/dL (ref 6.5–8.1)

## 2024-01-23 LAB — BRAIN NATRIURETIC PEPTIDE: B Natriuretic Peptide: 13 pg/mL (ref 0.0–100.0)

## 2024-01-23 LAB — TROPONIN I (HIGH SENSITIVITY): Troponin I (High Sensitivity): 4 ng/L (ref <18)

## 2024-01-23 LAB — MAGNESIUM: Magnesium: 1.4 mg/dL — ABNORMAL LOW (ref 1.7–2.4)

## 2024-01-23 MED ORDER — IPRATROPIUM-ALBUTEROL 0.5-2.5 (3) MG/3ML IN SOLN
3.0000 mL | Freq: Once | RESPIRATORY_TRACT | Status: AC
  Administered 2024-01-23: 3 mL via RESPIRATORY_TRACT
  Filled 2024-01-23: qty 3

## 2024-01-23 MED ORDER — PREDNISONE 10 MG PO TABS: 40.0000 mg | ORAL_TABLET | Freq: Every day | ORAL | 0 refills | Status: AC

## 2024-01-23 MED ORDER — SODIUM CHLORIDE 0.9 % IV BOLUS
500.0000 mL | Freq: Once | INTRAVENOUS | Status: AC
  Administered 2024-01-23: 500 mL via INTRAVENOUS

## 2024-01-23 MED ORDER — MAGNESIUM SULFATE 2 GM/50ML IV SOLN
2.0000 g | Freq: Once | INTRAVENOUS | Status: AC
  Administered 2024-01-23: 2 g via INTRAVENOUS
  Filled 2024-01-23: qty 50

## 2024-01-23 MED ORDER — ONDANSETRON HCL 4 MG/2ML IJ SOLN
4.0000 mg | Freq: Once | INTRAMUSCULAR | Status: AC
  Administered 2024-01-23: 4 mg via INTRAVENOUS
  Filled 2024-01-23: qty 2

## 2024-01-23 MED ORDER — POTASSIUM CHLORIDE 20 MEQ PO PACK
60.0000 meq | PACK | Freq: Once | ORAL | Status: AC
  Administered 2024-01-23: 60 meq via ORAL
  Filled 2024-01-23: qty 3

## 2024-01-23 MED ORDER — POTASSIUM CHLORIDE 10 MEQ/100ML IV SOLN
10.0000 meq | Freq: Once | INTRAVENOUS | Status: AC
  Administered 2024-01-23: 10 meq via INTRAVENOUS
  Filled 2024-01-23: qty 100

## 2024-01-23 MED ORDER — DOXYCYCLINE HYCLATE 100 MG PO CAPS: 100.0000 mg | ORAL_CAPSULE | Freq: Two times a day (BID) | ORAL | 0 refills | Status: DC

## 2024-01-23 MED ORDER — POTASSIUM CHLORIDE ER 10 MEQ PO TBCR: 10.0000 meq | EXTENDED_RELEASE_TABLET | Freq: Every day | ORAL | 0 refills | Status: DC

## 2024-01-23 MED ORDER — METHYLPREDNISOLONE SODIUM SUCC 125 MG IJ SOLR
125.0000 mg | Freq: Once | INTRAMUSCULAR | Status: AC
  Administered 2024-01-23: 125 mg via INTRAVENOUS
  Filled 2024-01-23: qty 2

## 2024-01-23 MED ORDER — MAGNESIUM OXIDE 440 MG PO TABS: 440.0000 mg | ORAL_TABLET | Freq: Every day | ORAL | 0 refills | Status: DC

## 2024-01-23 NOTE — ED Notes (Signed)
Pt refuses Covid swab.

## 2024-01-23 NOTE — ED Provider Notes (Signed)
 [place] [facility] Provider Note   CSN: [id number] Arrival date & time: 01/23/24  1724     History  Chief Complaint  Patient presents with   Shortness of Breath    Jeanette Aguirre is a 63 y.o. female.  Patient is a 64 year old female who presents emergency department the chief complaint of shortness of breath, cough, congestion which has been worsening over approximate the past week.  She notes that she was seen at an urgent care and placed on albuterol  and cough medication but notes that she continues to have worsening symptoms.  She admits to intermittent fevers.  She has had no associated abdominal pain, nausea, vomiting, diarrhea.  She denies any dizziness, lightheadedness or syncope.   Shortness of Breath      Home Medications Prior to Admission medications   Medication Sig Start Date End Date Taking? Authorizing Provider  acarbose (PRECOSE) 50 MG tablet Take 50 mg by mouth 3 (three) times daily.    [provider]  albuterol  (VENTOLIN  HFA) 108 (90 Base) MCG/ACT inhaler Inhale 1-2 puffs into the lungs every 6 (six) hours as needed for wheezing or shortness of breath. 01/20/24   Wilhemena Harbour, NP  atorvastatin  (LIPITOR) 20 MG tablet Take 20 mg by mouth at bedtime.     [provider]  cetirizine  (ZYRTEC ) 10 MG tablet Take 10 mg by mouth at bedtime.    [provider]  cetirizine -pseudoephedrine  (ZYRTEC -D) 5-120 MG tablet Take 1 tablet by mouth daily. Patient not taking: Reported on 01/20/2024 02/13/23   Leath-Warren, Belen Bowers, NP  DULoxetine (CYMBALTA) 60 MG capsule Take 60 mg by mouth.    [provider]  fluticasone  (FLONASE ) 50 MCG/ACT nasal spray Place 2 sprays into both nostrils daily. Patient not taking: Reported on 01/20/2024 02/13/23   Leath-Warren, Belen Bowers, NP  glimepiride  (AMARYL ) 4 MG tablet Take 4 mg by mouth 2 (two) times daily with breakfast and lunch.    [provider]   hyoscyamine  (LEVSIN) 0.125 MG tablet Take 1 tablet (0.125 mg total) by mouth every 4 (four) hours as needed (for onset of vomiting/diarrhea episodes). Patient not taking: Reported on 01/20/2024 10/04/21   Lanney Pitts, PA-C  JARDIANCE 25 MG TABS tablet Take 25 mg by mouth every morning. 11/12/20   [provider]  losartan (COZAAR) 25 MG tablet Take 1 tablet by mouth daily. 11/02/23   [provider]  metoCLOPramide  (REGLAN ) 10 MG tablet Take 10 mg by mouth.    [provider]  ondansetron  (ZOFRAN  ODT) 4 MG disintegrating tablet Take 1 tablet (4 mg total) by mouth every 6 (six) hours as needed for nausea or vomiting. 05/29/21   Lanney Pitts, PA-C  pantoprazole  (PROTONIX ) 40 MG tablet Take 1 tablet (40 mg total) by mouth 2 (two) times daily before a meal. For one month, then go back to once daily. 08/30/21   Lanney Pitts, PA-C  promethazine  (PHENERGAN ) 25 MG tablet Take 1/2 to 1 tablet every 6 hours as needed for nausea/vomiting Patient not taking: Reported on 01/20/2024 06/05/21   Evander Hills, PA-C  promethazine -dextromethorphan (PROMETHAZINE -DM) 6.25-15 MG/5ML syrup Take 5 mLs by mouth at bedtime as needed for cough. Do not take with alcohol or while driving or operating heavy machinery.  May cause drowsiness. 01/20/24   Wilhemena Harbour, NP  QUEtiapine (SEROQUEL) 50 MG tablet Take 50 mg by mouth at bedtime as needed for sleep. 07/08/21   [provider]  sitaGLIPtin-metformin (JANUMET) 50-1000 MG tablet Take 1 tablet by mouth 2 (two) times daily with a meal.    [provider]      Allergies    Lisinopril , Betadine  [povidone iodine ], Povidone-iodine , and Ciprofloxacin    Review of Systems   Review of Systems  Respiratory:  Positive for shortness of breath.   All other systems reviewed and are negative.   Physical Exam Updated Vital Signs BP (!) 147/90   Pulse 99   Temp 99.4 F (37.4 C) (Oral)   Resp 20   Ht 5\' 6"  (1.676 m)   Wt  104.3 kg   LMP 03/03/2012   SpO2 93%   BMI 37.12 kg/m  Physical Exam Vitals and nursing note reviewed.  Constitutional:      Appearance: Normal appearance.  HENT:     Head: Normocephalic and atraumatic.     Nose: Nose normal.     Mouth/Throat:     Mouth: Mucous membranes are moist.  Eyes:     Extraocular Movements: Extraocular movements intact.     Conjunctiva/sclera: Conjunctivae normal.     Pupils: Pupils are equal, round, and reactive to light.  Cardiovascular:     Rate and Rhythm: Normal rate and regular rhythm.     Pulses: Normal pulses.     Heart sounds: Normal heart sounds.  Pulmonary:     Effort: Pulmonary effort is normal. No tachypnea.     Breath sounds: No stridor. Wheezing present. No decreased breath sounds, rhonchi or rales.  Chest:     Chest [place]: No tenderness.  Abdominal:     General: Abdomen is flat. Bowel sounds are normal.     Palpations: Abdomen is soft. There is no mass.     Tenderness: There is no abdominal tenderness. There is no guarding.  Musculoskeletal:        General: Normal range of motion.     Cervical back: Normal range of motion and neck supple.     Right lower leg: No edema.     Left lower leg: No edema.  Skin:    General: Skin is warm and dry.     Findings: No erythema or rash.  Neurological:     General: No focal deficit present.     Mental Status: She is alert and oriented to person, place, and time. Mental status is at baseline.  Psychiatric:        Mood and Affect: Mood normal.        Behavior: Behavior normal.        Thought Content: Thought content normal.        Judgment: Judgment normal.     ED Results / Procedures / Treatments   Labs (all labs ordered are listed, but only abnormal results are displayed) Labs Reviewed  RESP PANEL BY RT-PCR (RSV, FLU A&B, COVID)  [id number]  COMPREHENSIVE METABOLIC PANEL WITH GFR  CBC WITH DIFFERENTIAL/PLATELET  BRAIN NATRIURETIC PEPTIDE  TROPONIN I (HIGH SENSITIVITY)     EKG None  Radiology DG Chest Port 1 View Result Date: 01/23/2024 CLINICAL DATA:  Shortness of breath. EXAM: PORTABLE CHEST 1 VIEW COMPARISON:  Chest radiograph dated 01/20/2024. FINDINGS: There is mild diffuse chronic interstitial coarsening. No focal consolidation, pleural effusion or pneumothorax. The cardiac silhouette is within normal limits. No acute osseous pathology. IMPRESSION: No active disease. Electronically Signed   By: Angus Bark M.D.   On: 01/23/2024 18:32    Procedures Procedures    Medications Ordered in ED Medications  methylPREDNISolone  sodium succinate (SOLU-MEDROL ) 125 mg/2 mL injection 125 mg (has no administration in time range)  sodium chloride  0.9 % bolus 500 mL (has no administration in time range)  ipratropium-albuterol  (DUONEB) 0.5-2.5 (3) MG/3ML nebulizer solution 3 mL (3 mLs Nebulization Given 01/23/24 1837)  ipratropium-albuterol  (DUONEB) 0.5-2.5 (3) MG/3ML nebulizer solution 3 mL (3 mLs Nebulization Given 01/23/24 1837)    ED Course/ Medical Decision Making/ A&P                                 Medical Decision Making Amount and/or Complexity of Data Reviewed Labs: ordered. Radiology: ordered. ECG/medicine tests: ordered.  Risk OTC drugs. Prescription drug management.   This patient presents to the ED for concern of shortness of breath, cough differential diagnosis includes pneumonia, acute viral syndrome, CHF, ACS, asthma    Additional history obtained:  Additional history obtained from medical records External records from outside source obtained and reviewed including medical records   Lab Tests:  I Ordered, and personally interpreted labs.  The pertinent results include: Hypomagnesemia, hypokalemia, normal liver function and kidney function, mild anemia, no leukocytosis, negative troponin, negative BNP   Imaging Studies ordered:  I ordered imaging studies including chest x-ray I independently visualized and interpreted  imaging which showed no acute cardiopulmonary process I agree with the radiologist interpretation   Medicines ordered and prescription drug management:  I ordered medication including sneezing, potassium, IV fluids, Zofran , DuoNeb for acute bronchitis Reevaluation of the patient after these medicines showed that the patient improved I have reviewed the patients home medicines and have made adjustments as needed   Problem List / ED Course:  Patient is feeling much better at this time and is stable for discharge home.  Patient was able to ambulate in the emergency department without difficulty and with no desaturations.  Patient already has close follow-up scheduled with her primary care doctor within the next 48 hours and she was directed to keep this appointment.  Will cover patient for acute bronchitis at this time.  Patient case was fully discussed with attending physician who is in agreement to plan.  Potassium and magnesium were repleted in the emergency department and we will continue supplementation on an outpatient basis.  Strict return precautions were provided for any new or worsening symptoms.  Patient voiced understand to the plan and had no additional questions.   Social Determinants of Health:  None           Final Clinical Impression(s) / ED Diagnoses Final diagnoses:  None    Rx / DC Orders ED Discharge Orders     None         Miyah Hampshire D, PA-C 01/23/24 2216    Guadalupe Lee, MD 01/24/24 2044

## 2024-01-23 NOTE — ED Triage Notes (Signed)
 Pt ambulatory to triage with c/o shortness of breath starting on Thursday evening.  Went to UC on Friday and was prescribed cough medications and albuterol .  Pt states she does not feel like she is getting any better.  Pt with noted dry cough in triage.  Pt able to talk in complete uninterrupted sentences.

## 2024-01-23 NOTE — Discharge Instructions (Addendum)
 Please follow-up closely with your primary care doctor on an outpatient basis.  Return to emergency department immediately for any new or worsening symptoms.

## 2024-01-23 NOTE — ED Notes (Signed)
 Patient ambulated with pulse ox. 94% initially when sitting on side of bed then dropped to 91% while ambulating.

## 2024-01-25 DIAGNOSIS — J45909 Unspecified asthma, uncomplicated: Secondary | ICD-10-CM | POA: Diagnosis not present

## 2024-01-25 DIAGNOSIS — I10 Essential (primary) hypertension: Secondary | ICD-10-CM | POA: Diagnosis not present

## 2024-01-25 DIAGNOSIS — Z6836 Body mass index (BMI) 36.0-36.9, adult: Secondary | ICD-10-CM | POA: Diagnosis not present

## 2024-01-25 DIAGNOSIS — E6609 Other obesity due to excess calories: Secondary | ICD-10-CM | POA: Diagnosis not present

## 2024-01-25 DIAGNOSIS — E1165 Type 2 diabetes mellitus with hyperglycemia: Secondary | ICD-10-CM | POA: Diagnosis not present

## 2024-02-09 ENCOUNTER — Telehealth: Payer: Self-pay | Admitting: Gastroenterology

## 2024-02-09 NOTE — Telephone Encounter (Signed)
 Left pt a message to schedule an OV before colonoscopy scheduled.

## 2024-03-04 NOTE — Progress Notes (Unsigned)
 GI Office Note    Referring Provider: Kathyleen Parkins, MD Primary Care Physician:  Kathyleen Parkins, MD  Primary Gastroenterologist:  Chief Complaint   No chief complaint on file.    History of Present Illness   Jeanette Aguirre is a 63 y.o. female presenting today     Seen at [facility] 11/2021 for episodic N/V. Delayed GES but discrete episodes raise suspicious for cyclic vomiting syndrom (and often overlap with delayed GES). Advised to aovid fatty foods and insoluble fiber. Avoid acidic, spicy foods, carbonated beverages, etoh and smoking. Optimize glycemic control. Avoid incretin-based therapies and GLP1 agonists. Stop NSAIDs.  EGD 07/2021: -normal esophagus -normal stomach -normal duodenal bulb and second portion of duodenum -s/p gastric and duodenal bx, benign and no h.pylori  Colonoscopy 12/2020: -five 4-10mm polyps at splenic flexure and ascending colon, tubular adneomas -next colonoscopy in 3 years   Medications   Current Outpatient Medications  Medication Sig Dispense Refill   acarbose (PRECOSE) 50 MG tablet Take 50 mg by mouth 3 (three) times daily.     albuterol  (VENTOLIN  HFA) 108 (90 Base) MCG/ACT inhaler Inhale 1-2 puffs into the lungs every 6 (six) hours as needed for wheezing or shortness of breath. 6.7 g 0   atorvastatin  (LIPITOR) 20 MG tablet Take 20 mg by mouth at bedtime.      cetirizine  (ZYRTEC ) 10 MG tablet Take 10 mg by mouth at bedtime.     cetirizine -pseudoephedrine  (ZYRTEC -D) 5-120 MG tablet Take 1 tablet by mouth daily. (Patient not taking: Reported on 01/20/2024) 30 tablet 0   doxycycline  (VIBRAMYCIN ) 100 MG capsule Take 1 capsule (100 mg total) by mouth 2 (two) times daily. 20 capsule 0   DULoxetine (CYMBALTA) 60 MG capsule Take 60 mg by mouth.     fluticasone  (FLONASE ) 50 MCG/ACT nasal spray Place 2 sprays into both nostrils daily. (Patient not taking: Reported on 01/20/2024) 16 g 0   glimepiride  (AMARYL ) 4 MG tablet Take 4 mg by mouth 2 (two)  times daily with breakfast and lunch.     hyoscyamine  (LEVSIN ) 0.125 MG tablet Take 1 tablet (0.125 mg total) by mouth every 4 (four) hours as needed (for onset of vomiting/diarrhea episodes). (Patient not taking: Reported on 01/20/2024) 90 tablet 0   JARDIANCE 25 MG TABS tablet Take 25 mg by mouth every morning.     losartan (COZAAR) 25 MG tablet Take 1 tablet by mouth daily.     Magnesium  Oxide 440 MG TABS Take 440 mg by mouth daily. 5 tablet 0   metoCLOPramide  (REGLAN ) 10 MG tablet Take 10 mg by mouth.     ondansetron  (ZOFRAN  ODT) 4 MG disintegrating tablet Take 1 tablet (4 mg total) by mouth every 6 (six) hours as needed for nausea or vomiting. 60 tablet 0   pantoprazole  (PROTONIX ) 40 MG tablet Take 1 tablet (40 mg total) by mouth 2 (two) times daily before a meal. For one month, then go back to once daily. 60 tablet 3   potassium chloride  (KLOR-CON ) 10 MEQ tablet Take 1 tablet (10 mEq total) by mouth daily for 7 days. 7 tablet 0   promethazine  (PHENERGAN ) 25 MG tablet Take 1/2 to 1 tablet every 6 hours as needed for nausea/vomiting (Patient not taking: Reported on 01/20/2024) 30 tablet 0   promethazine -dextromethorphan (PROMETHAZINE -DM) 6.25-15 MG/5ML syrup Take 5 mLs by mouth at bedtime as needed for cough. Do not take with alcohol or while driving or operating heavy machinery.  May cause drowsiness. 118 mL  0   QUEtiapine (SEROQUEL) 50 MG tablet Take 50 mg by mouth at bedtime as needed for sleep.     sitaGLIPtin-metformin (JANUMET) 50-1000 MG tablet Take 1 tablet by mouth 2 (two) times daily with a meal.     No current facility-administered medications for this visit.    Allergies   Allergies as of 03/07/2024 - Review Complete 01/23/2024  Allergen Reaction Noted   Lisinopril  Swelling 08/23/2018   Betadine  [povidone iodine ] Swelling 10/01/2016   Povidone-iodine  Swelling 10/01/2016   Ciprofloxacin Nausea Only 11/27/2017    Past Medical History   Past Medical History:  Diagnosis Date    Anxiety    Depression    Diabetes mellitus without complication (HCC)    Family history of adverse reaction to anesthesia    Gastroparesis    GERD (gastroesophageal reflux disease)    Hyperlipemia    Pneumonia    PONV (postoperative nausea and vomiting)     Past Surgical History   Past Surgical History:  Procedure Laterality Date   ADENOIDECTOMY     BIOPSY  08/19/2021   Procedure: BIOPSY;  Surgeon: Suzette Espy, MD;  [facility]: [facility];  Service: Endoscopy;;   CHOLECYSTECTOMY     COLONOSCOPY N/A 09/04/2017   Dr. Riley Cheadle: multiple tubular adenomas removed, diverticulosis. next TCS in 3 years.    COLONOSCOPY N/A 01/18/2021   Procedure: COLONOSCOPY;  Surgeon: Suzette Espy, MD;  [facility]: [facility];  Service: Endoscopy;  Laterality: N/A;  AM (diabetic)   ESOPHAGOGASTRODUODENOSCOPY N/A 04/09/2018   Dr. Riley Cheadle: inflammation of esophagus c/w acid reflux.    ESOPHAGOGASTRODUODENOSCOPY (EGD) WITH PROPOFOL  N/A 08/19/2021   Procedure: ESOPHAGOGASTRODUODENOSCOPY (EGD) WITH PROPOFOL ;  Surgeon: Suzette Espy, MD;  [facility]: [facility];  Service: Endoscopy;  Laterality: N/A;  11:15am   LIPOMA EXCISION Right 03/07/2020   Procedure: EXCISION LIPOMA, THIGH;  Surgeon: Alanda Allegra, MD;  Location: [facility];  Service: General;  Laterality: Right;   POLYPECTOMY  01/18/2021   Procedure: POLYPECTOMY;  Surgeon: Suzette Espy, MD;  [facility];  Service: Endoscopy;;   TONSILLECTOMY     and adenoids   TUBAL LIGATION     TYMPANOSTOMY TUBE PLACEMENT      Past Family History   Family History  Problem Relation Age of Onset   Diabetes Mother    Peripheral vascular disease Mother    Diabetes Father    Colitis Daughter 36       lymphocytic colitis   Colon cancer Neg Hx     Past Social History   Social History   Socioeconomic History   Marital status: Divorced    Spouse name: Not on file   Number of children: Not on file   Years of education: Not on file    Highest education level: Not on file  Occupational History   Not on file  Tobacco Use   Smoking status: Former    Current packs/day: 0.00    Average packs/day: 1 pack/day for 15.0 years (15.0 ttl pk-yrs)    Types: Cigarettes    Start date: 09/22/2001    Quit date: 09/22/2016    Years since quitting: 7.4   Smokeless tobacco: Never  Vaping Use   Vaping status: Never Used  Substance and Sexual Activity   Alcohol use: Yes    Comment: very little   Drug use: No   Sexual activity: Yes  Other Topics Concern   Not on file  Social History Narrative   Not on  file   Social Drivers of Corporate investment banker Strain: Not on file  Food Insecurity: Not on file  Transportation Needs: Not on file  Physical Activity: Not on file  Stress: Not on file  Social Connections: Not on file  Intimate Partner Violence: Not on file    Review of Systems   General: Negative for anorexia, weight loss, fever, chills, fatigue, weakness. Eyes: Negative for vision changes.  ENT: Negative for hoarseness, difficulty swallowing , nasal congestion. CV: Negative for chest pain, angina, palpitations, dyspnea on exertion, peripheral edema.  Respiratory: Negative for dyspnea at rest, dyspnea on exertion, cough, sputum, wheezing.  GI: See history of present illness. GU:  Negative for dysuria, hematuria, urinary incontinence, urinary frequency, nocturnal urination.  MS: Negative for joint pain, low back pain.  Derm: Negative for rash or itching.  Neuro: Negative for weakness, abnormal sensation, seizure, frequent headaches, memory loss,  confusion.  Psych: Negative for anxiety, depression, suicidal ideation, hallucinations.  Endo: Negative for unusual weight change.  Heme: Negative for bruising or bleeding. Allergy: Negative for rash or hives.  Physical Exam   LMP 03/03/2012    General: Well-nourished, well-developed in no acute distress.  Head: Normocephalic, atraumatic.   Eyes: Conjunctiva pink, no  icterus. Mouth: Oropharyngeal mucosa moist and pink  Neck: Supple without thyromegaly, masses, or lymphadenopathy.  Lungs: Clear to auscultation bilaterally.  Heart: Regular rate and rhythm, no murmurs rubs or gallops.  Abdomen: Bowel sounds are normal, nontender, nondistended, no hepatosplenomegaly or masses,  no abdominal bruits or hernia, no rebound or guarding.   Rectal: not performed Extremities: No lower extremity edema. No clubbing or deformities.  Neuro: Alert and oriented x 4 , grossly normal neurologically.  Skin: Warm and dry, no rash or jaundice.   Psych: Alert and cooperative, normal mood and affect.  Labs   Lab Results  Component Value Date   NA 137 01/23/2024   CL 97 (L) 01/23/2024   K 2.8 (L) 01/23/2024   CO2 25 01/23/2024   BUN 8 01/23/2024   CREATININE 0.68 01/23/2024   GFRNONAA >60 01/23/2024   CALCIUM  9.1 01/23/2024   ALBUMIN 3.5 01/23/2024   GLUCOSE 228 (H) 01/23/2024   Lab Results  Component Value Date   ALT 18 01/23/2024   AST 18 01/23/2024   ALKPHOS 99 01/23/2024   BILITOT 0.4 01/23/2024   Lab Results  Component Value Date   WBC 5.9 01/23/2024   HGB 11.6 (L) 01/23/2024   HCT 37.9 01/23/2024   MCV 82.9 01/23/2024   PLT 293 01/23/2024   Lab Results  Component Value Date   IRON 35 12/05/2021   TIBC 355 12/05/2021   FERRITIN 14 (L) 12/05/2021   07/2023: B12 224L, folate 5.5 11/2021: iron 35, iron sat 10L, ferritin 14L, TIBC 355.   Imaging Studies   No results found.  Assessment/Plan:       Jeanette Aguirre, MHS, PA-C [facility]

## 2024-03-07 ENCOUNTER — Encounter: Payer: Self-pay | Admitting: Gastroenterology

## 2024-03-07 ENCOUNTER — Encounter: Payer: Self-pay | Admitting: *Deleted

## 2024-03-07 ENCOUNTER — Ambulatory Visit (INDEPENDENT_AMBULATORY_CARE_PROVIDER_SITE_OTHER): Admitting: Gastroenterology

## 2024-03-07 VITALS — BP 135/75 | HR 86 | Temp 98.9°F | Ht 66.0 in | Wt 224.8 lb

## 2024-03-07 DIAGNOSIS — R1319 Other dysphagia: Secondary | ICD-10-CM

## 2024-03-07 DIAGNOSIS — K760 Fatty (change of) liver, not elsewhere classified: Secondary | ICD-10-CM | POA: Diagnosis not present

## 2024-03-07 DIAGNOSIS — R1310 Dysphagia, unspecified: Secondary | ICD-10-CM | POA: Diagnosis not present

## 2024-03-07 DIAGNOSIS — K219 Gastro-esophageal reflux disease without esophagitis: Secondary | ICD-10-CM

## 2024-03-07 DIAGNOSIS — E538 Deficiency of other specified B group vitamins: Secondary | ICD-10-CM

## 2024-03-07 DIAGNOSIS — Z8639 Personal history of other endocrine, nutritional and metabolic disease: Secondary | ICD-10-CM | POA: Diagnosis not present

## 2024-03-07 DIAGNOSIS — Z860101 Personal history of adenomatous and serrated colon polyps: Secondary | ICD-10-CM

## 2024-03-07 DIAGNOSIS — D509 Iron deficiency anemia, unspecified: Secondary | ICD-10-CM

## 2024-03-07 NOTE — Patient Instructions (Signed)
 Please complete labs in next 1-2 days. I would like to have those back before your upcoming procedures in case we need to correct your potassium and magnesium . FYI, you did have biopsies for H.pylori during your 2022 endoscopy and there was no evidence of H.pylori at that time. Upper endoscopy and colonoscopy to be scheduled.  Continue pantoprazole  40mg  once to twice daily as needed.  Continue to strive towards control of your diabetes, weight reduction, increase physical activity as tolerate. These are ways you can help reduce risk of fatty liver progression.  We will update ultrasound with elastography after you have completed your procedures.

## 2024-03-08 ENCOUNTER — Ambulatory Visit: Payer: Self-pay | Admitting: Gastroenterology

## 2024-03-08 MED ORDER — MAGNESIUM OXIDE 400 MG PO TABS: 400.0000 mg | ORAL_TABLET | Freq: Two times a day (BID) | ORAL | 0 refills | Status: AC

## 2024-03-09 LAB — BASIC METABOLIC PANEL WITH GFR
BUN/Creatinine Ratio: 15 (ref 12–28)
BUN: 10 mg/dL (ref 8–27)
CO2: 19 mmol/L — ABNORMAL LOW (ref 20–29)
Calcium: 9.7 mg/dL (ref 8.7–10.3)
Chloride: 100 mmol/L (ref 96–106)
Creatinine, Ser: 0.67 mg/dL (ref 0.57–1.00)
Glucose: 214 mg/dL — ABNORMAL HIGH (ref 70–99)
Potassium: 3.7 mmol/L (ref 3.5–5.2)
Sodium: 140 mmol/L (ref 134–144)
eGFR: 100 mL/min/{1.73_m2} (ref >59)

## 2024-03-09 LAB — CBC WITH DIFFERENTIAL/PLATELET
Basophils Absolute: 0 10*3/uL (ref 0.0–0.2)
Basos: 1 %
EOS (ABSOLUTE): 0.2 10*3/uL (ref 0.0–0.4)
Eos: 2 %
Hematocrit: 36.4 % (ref 34.0–46.6)
Hemoglobin: 11.4 g/dL (ref 11.1–15.9)
Immature Grans (Abs): 0 10*3/uL (ref 0.0–0.1)
Immature Granulocytes: 0 %
Lymphocytes Absolute: 2.6 10*3/uL (ref 0.7–3.1)
Lymphs: 31 %
MCH: 25.4 pg — ABNORMAL LOW (ref 26.6–33.0)
MCHC: 31.3 g/dL — ABNORMAL LOW (ref 31.5–35.7)
MCV: 81 fL (ref 79–97)
Monocytes Absolute: 0.3 10*3/uL (ref 0.1–0.9)
Monocytes: 4 %
Neutrophils Absolute: 5.1 10*3/uL (ref 1.4–7.0)
Neutrophils: 62 %
Platelets: 399 10*3/uL (ref 150–450)
RBC: 4.48 x10E6/uL (ref 3.77–5.28)
RDW: 14.6 % (ref 11.7–15.4)
WBC: 8.3 10*3/uL (ref 3.4–10.8)

## 2024-03-09 LAB — IRON,TIBC AND FERRITIN PANEL
Ferritin: 10 ng/mL — ABNORMAL LOW (ref 15–150)
Iron Saturation: 9 % — CL (ref 15–55)
Iron: 33 ug/dL (ref 27–159)
Total Iron Binding Capacity: 384 ug/dL (ref 250–450)
UIBC: 351 ug/dL (ref 131–425)

## 2024-03-09 LAB — ANTI-PARIETAL ANTIBODY: Parietal Cell Ab: 2.4 U (ref 0.0–20.0)

## 2024-03-09 LAB — B12 AND FOLATE PANEL
Folate: 7.5 ng/mL (ref >3.0)
Vitamin B-12: 333 pg/mL (ref 232–1245)

## 2024-03-09 LAB — MAGNESIUM: Magnesium: 1.3 mg/dL — ABNORMAL LOW (ref 1.6–2.3)

## 2024-03-09 LAB — INTRINSIC FACTOR ANTIBODIES: Intrinsic Factor Abs, Serum: 1 [AU]/ml (ref 0.0–1.1)

## 2024-03-17 ENCOUNTER — Other Ambulatory Visit: Payer: Self-pay | Admitting: Orthopedic Surgery

## 2024-03-24 ENCOUNTER — Encounter (HOSPITAL_BASED_OUTPATIENT_CLINIC_OR_DEPARTMENT_OTHER): Payer: Self-pay | Admitting: Orthopedic Surgery

## 2024-03-29 ENCOUNTER — Encounter (HOSPITAL_BASED_OUTPATIENT_CLINIC_OR_DEPARTMENT_OTHER): Payer: Self-pay | Admitting: Orthopedic Surgery

## 2024-03-29 ENCOUNTER — Other Ambulatory Visit: Payer: Self-pay

## 2024-04-01 ENCOUNTER — Ambulatory Visit (HOSPITAL_BASED_OUTPATIENT_CLINIC_OR_DEPARTMENT_OTHER): Admitting: Anesthesiology

## 2024-04-01 ENCOUNTER — Encounter (HOSPITAL_BASED_OUTPATIENT_CLINIC_OR_DEPARTMENT_OTHER)
Admission: RE | Disposition: A | Discharge status: Home / Self Care | Payer: Self-pay | Source: Home / Self Care | Attending: Orthopedic Surgery

## 2024-04-01 ENCOUNTER — Encounter (HOSPITAL_BASED_OUTPATIENT_CLINIC_OR_DEPARTMENT_OTHER): Payer: Self-pay | Admitting: Orthopedic Surgery

## 2024-04-01 ENCOUNTER — Other Ambulatory Visit: Payer: Self-pay

## 2024-04-01 ENCOUNTER — Ambulatory Visit (HOSPITAL_BASED_OUTPATIENT_CLINIC_OR_DEPARTMENT_OTHER)
Admission: RE | Admit: 2024-04-01 | Discharge: 2024-04-01 | Disposition: A | Discharge status: Home / Self Care | Attending: Orthopedic Surgery | Admitting: Orthopedic Surgery

## 2024-04-01 DIAGNOSIS — K219 Gastro-esophageal reflux disease without esophagitis: Secondary | ICD-10-CM | POA: Insufficient documentation

## 2024-04-01 DIAGNOSIS — Z87891 Personal history of nicotine dependence: Secondary | ICD-10-CM | POA: Insufficient documentation

## 2024-04-01 DIAGNOSIS — F32A Depression, unspecified: Secondary | ICD-10-CM | POA: Diagnosis not present

## 2024-04-01 DIAGNOSIS — E1143 Type 2 diabetes mellitus with diabetic autonomic (poly)neuropathy: Secondary | ICD-10-CM | POA: Diagnosis not present

## 2024-04-01 DIAGNOSIS — M71342 Other bursal cyst, left hand: Secondary | ICD-10-CM | POA: Diagnosis present

## 2024-04-01 DIAGNOSIS — M19042 Primary osteoarthritis, left hand: Secondary | ICD-10-CM

## 2024-04-01 DIAGNOSIS — K3184 Gastroparesis: Secondary | ICD-10-CM | POA: Diagnosis not present

## 2024-04-01 DIAGNOSIS — Z01818 Encounter for other preprocedural examination: Secondary | ICD-10-CM

## 2024-04-01 DIAGNOSIS — Z7984 Long term (current) use of oral hypoglycemic drugs: Secondary | ICD-10-CM | POA: Insufficient documentation

## 2024-04-01 DIAGNOSIS — F419 Anxiety disorder, unspecified: Secondary | ICD-10-CM | POA: Insufficient documentation

## 2024-04-01 HISTORY — PX: CYST REMOVAL HAND: SHX6279

## 2024-04-01 LAB — GLUCOSE, CAPILLARY
Glucose-Capillary: 163 mg/dL — ABNORMAL HIGH (ref 70–99)
Glucose-Capillary: 113 mg/dL — ABNORMAL HIGH (ref 70–99)

## 2024-04-01 SURGERY — REMOVAL, CYST, HAND
Anesthesia: Regional | Site: Finger | Laterality: Left

## 2024-04-01 MED ORDER — OXYCODONE HCL 5 MG/5ML PO SOLN: 5.0000 mg | Freq: Once | ORAL | Status: DC | PRN

## 2024-04-01 MED ORDER — ACETAMINOPHEN 10 MG/ML IV SOLN: 1000.0000 mg | Freq: Once | INTRAVENOUS | Status: DC | PRN

## 2024-04-01 MED ORDER — CEFAZOLIN SODIUM-DEXTROSE 2-4 GM/100ML-% IV SOLN: 2.0000 g | INTRAVENOUS | Status: DC

## 2024-04-01 MED ORDER — MIDAZOLAM HCL 5 MG/5ML IJ SOLN
INTRAMUSCULAR | Status: DC | PRN
  Administered 2024-04-01: 2 mg via INTRAVENOUS

## 2024-04-01 MED ORDER — HYDROCODONE-ACETAMINOPHEN 5-325 MG PO TABS: 1.0000 | ORAL_TABLET | Freq: Four times a day (QID) | ORAL | 0 refills | Status: DC | PRN

## 2024-04-01 MED ORDER — ONDANSETRON HCL 4 MG/2ML IJ SOLN
INTRAMUSCULAR | Status: DC | PRN
Start: 2024-04-01 — End: 2024-04-01
  Administered 2024-04-01: 4 mg via INTRAVENOUS

## 2024-04-01 MED ORDER — LIDOCAINE HCL (PF) 0.5 % IJ SOLN
INTRAMUSCULAR | Status: DC | PRN
  Administered 2024-04-01: 30 mL via INTRAVENOUS

## 2024-04-01 MED ORDER — ONDANSETRON HCL 4 MG/2ML IJ SOLN
INTRAMUSCULAR | Status: AC
Start: 2024-04-01 — End: 2024-04-01
  Filled 2024-04-01: qty 2

## 2024-04-01 MED ORDER — PROPOFOL 10 MG/ML IV BOLUS
INTRAVENOUS | Status: AC
  Filled 2024-04-01: qty 20

## 2024-04-01 MED ORDER — FENTANYL CITRATE (PF) 100 MCG/2ML IJ SOLN
INTRAMUSCULAR | Status: AC
  Filled 2024-04-01: qty 2

## 2024-04-01 MED ORDER — CEFAZOLIN SODIUM-DEXTROSE 2-3 GM-%(50ML) IV SOLR
INTRAVENOUS | Status: DC | PRN
  Administered 2024-04-01: 2 g via INTRAVENOUS

## 2024-04-01 MED ORDER — FENTANYL CITRATE (PF) 100 MCG/2ML IJ SOLN
INTRAMUSCULAR | Status: DC | PRN
  Administered 2024-04-01 (×2): 50 ug via INTRAVENOUS

## 2024-04-01 MED ORDER — LACTATED RINGERS IV SOLN: INTRAVENOUS | Status: DC | PRN

## 2024-04-01 MED ORDER — PROPOFOL 500 MG/50ML IV EMUL
INTRAVENOUS | Status: DC | PRN
  Administered 2024-04-01: 100 ug/kg/min via INTRAVENOUS

## 2024-04-01 MED ORDER — CEFAZOLIN SODIUM-DEXTROSE 2-4 GM/100ML-% IV SOLN
INTRAVENOUS | Status: AC
  Filled 2024-04-01: qty 100

## 2024-04-01 MED ORDER — FENTANYL CITRATE (PF) 100 MCG/2ML IJ SOLN: 25.0000 ug | INTRAMUSCULAR | Status: DC | PRN

## 2024-04-01 MED ORDER — 0.9 % SODIUM CHLORIDE (POUR BTL) OPTIME
TOPICAL | Status: DC | PRN
  Administered 2024-04-01: 50 mL

## 2024-04-01 MED ORDER — BUPIVACAINE HCL (PF) 0.25 % IJ SOLN
INTRAMUSCULAR | Status: DC | PRN
  Administered 2024-04-01: 9 mL

## 2024-04-01 MED ORDER — OXYCODONE HCL 5 MG PO TABS: 5.0000 mg | ORAL_TABLET | Freq: Once | ORAL | Status: DC | PRN

## 2024-04-01 MED ORDER — ONDANSETRON HCL 4 MG/2ML IJ SOLN: 4.0000 mg | Freq: Once | INTRAMUSCULAR | Status: DC | PRN

## 2024-04-01 MED ORDER — MIDAZOLAM HCL 2 MG/2ML IJ SOLN
INTRAMUSCULAR | Status: AC
  Filled 2024-04-01: qty 2

## 2024-04-01 MED ORDER — LACTATED RINGERS IV SOLN: INTRAVENOUS | Status: DC

## 2024-04-01 SURGICAL SUPPLY — 45 items
BANDAGE GAUZE 1X75IN STRL (MISCELLANEOUS) IMPLANT
BENZOIN TINCTURE PRP APPL 2/3 (GAUZE/BANDAGES/DRESSINGS) IMPLANT
BLADE MINI RND TIP GREEN BEAV (BLADE) IMPLANT
BLADE SURG 15 STRL LF DISP TIS (BLADE) ×2 IMPLANT
BNDG COHESIVE 1X5 TAN STRL LF (GAUZE/BANDAGES/DRESSINGS) IMPLANT
BNDG COHESIVE 2X5 TAN ST LF (GAUZE/BANDAGES/DRESSINGS) IMPLANT
BNDG COMPR ESMARK 4X3 LF (GAUZE/BANDAGES/DRESSINGS) IMPLANT
BNDG ELASTIC 2INX 5YD STR LF (GAUZE/BANDAGES/DRESSINGS) IMPLANT
BNDG ELASTIC 3INX 5YD STR LF (GAUZE/BANDAGES/DRESSINGS) IMPLANT
BNDG GAUZE DERMACEA FLUFF 4 (GAUZE/BANDAGES/DRESSINGS) IMPLANT
BNDG PLASTER X FAST 3X3 WHT LF (CAST SUPPLIES) IMPLANT
CHLORAPREP W/TINT 26 (MISCELLANEOUS) ×1 IMPLANT
CORD BIPOLAR FORCEPS 12FT (ELECTRODE) ×1 IMPLANT
COVER BACK TABLE 60X90IN (DRAPES) ×1 IMPLANT
COVER MAYO STAND STRL (DRAPES) ×1 IMPLANT
CUFF TOURN SGL QUICK 18X4 (TOURNIQUET CUFF) ×1 IMPLANT
DRAPE EXTREMITY T 121X128X90 (DISPOSABLE) ×1 IMPLANT
DRAPE SURG 17X23 STRL (DRAPES) ×1 IMPLANT
GAUZE SPONGE 4X4 12PLY STRL (GAUZE/BANDAGES/DRESSINGS) ×1 IMPLANT
GAUZE STRETCH 2X75IN STRL (MISCELLANEOUS) IMPLANT
GAUZE XEROFORM 1X8 LF (GAUZE/BANDAGES/DRESSINGS) ×1 IMPLANT
GLOVE BIO SURGEON STRL SZ7.5 (GLOVE) ×1 IMPLANT
GLOVE BIOGEL PI IND STRL 7.0 (GLOVE) IMPLANT
GLOVE BIOGEL PI IND STRL 7.5 (GLOVE) IMPLANT
GLOVE BIOGEL PI IND STRL 8 (GLOVE) ×1 IMPLANT
GLOVE SURG SS PI 7.0 STRL IVOR (GLOVE) IMPLANT
GOWN STRL REUS W/ TWL LRG LVL3 (GOWN DISPOSABLE) ×1 IMPLANT
GOWN STRL REUS W/ TWL XL LVL3 (GOWN DISPOSABLE) IMPLANT
GOWN STRL REUS W/TWL LRG LVL3 (GOWN DISPOSABLE) IMPLANT
NDL HYPO 25X1 1.5 SAFETY (NEEDLE) ×1 IMPLANT
NEEDLE HYPO 25X1 1.5 SAFETY (NEEDLE) ×1 IMPLANT
NS IRRIG 1000ML POUR BTL (IV SOLUTION) ×1 IMPLANT
PACK BASIN DAY SURGERY FS (CUSTOM PROCEDURE TRAY) ×1 IMPLANT
PAD CAST 3X4 CTTN HI CHSV (CAST SUPPLIES) IMPLANT
PAD CAST 4YDX4 CTTN HI CHSV (CAST SUPPLIES) IMPLANT
PADDING CAST ABS COTTON 4X4 ST (CAST SUPPLIES) ×1 IMPLANT
SPLINT FINGER 3.25 911903 (SOFTGOODS) IMPLANT
STOCKINETTE 4X48 STRL (DRAPES) ×1 IMPLANT
STRIP CLOSURE SKIN 1/2X4 (GAUZE/BANDAGES/DRESSINGS) IMPLANT
SUT ETHILON 3 0 PS 1 (SUTURE) IMPLANT
SUT ETHILON 4 0 PS 2 18 (SUTURE) ×1 IMPLANT
SYR BULB EAR ULCER 3OZ GRN STR (SYRINGE) ×1 IMPLANT
SYR CONTROL 10ML LL (SYRINGE) ×1 IMPLANT
TOWEL GREEN STERILE FF (TOWEL DISPOSABLE) ×2 IMPLANT
UNDERPAD 30X36 HEAVY ABSORB (UNDERPADS AND DIAPERS) ×1 IMPLANT

## 2024-04-01 NOTE — Discharge Instructions (Addendum)

## 2024-04-01 NOTE — Op Note (Signed)
 NAME: Jeanette Aguirre MEDICAL RECORD NO: [id number] DATE OF BIRTH: 1961-02-12 FACILITY: [facility] LOCATION: [facility] PHYSICIAN: Atley Neubert R. 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  Left long finger excision mucoid cyst 2.  Left long finger debridement of DIP joint   SURGEON:  Franky Curia, M.D.   ASSISTANT: none   ANESTHESIA:  Bier block with sedation   INTRAVENOUS FLUIDS:  Per anesthesia flow sheet.   ESTIMATED BLOOD LOSS:  Minimal.   COMPLICATIONS:  None.   SPECIMENS: Left long finger mucoid cyst to pathology   TOURNIQUET TIME:    Total Tourniquet Time Documented: Forearm (Left) - 27 minutes Total: Forearm (Left) - 27 minutes    DISPOSITION:  Stable to PACU.   INDICATIONS: 63 year old female with left long finger mucoid cyst.  This is bothersome to her.  She wishes to have it removed and the DIP joint debrided to try to prevent recurrence.  Risks, benefits and alternatives of surgery were discussed including the risks of blood loss, infection, damage to nerves, vessels, tendons, ligaments, bone for surgery, need for additional surgery, complications with wound healing, continued pain, stiffness, , recurrence.  She voiced understanding of these risks and elected to proceed.  OPERATIVE COURSE:  After being identified preoperatively by myself,  the patient and I agreed on the procedure and site of the procedure.  The surgical site was marked.  Surgical consent had been signed. Preoperative IV antibiotic prophylaxis was given. She was transferred to the operating room and placed on the operating table in supine position with the Left upper extremity on an arm board.  Bier block anesthesia was induced by the anesthesiologist.  Left upper extremity was prepped and draped in  normal sterile orthopedic fashion.  A surgical pause was performed between the surgeons, anesthesia, and operating room staff and all were in agreement as to the patient, procedure, and site of procedure.  Tourniquet at the proximal aspect of the forearm had been inflated for the Bier block.  A hockey-stick shaped incision was made at the dorsum of the left long finger DIP joint.  This was carried in subcutaneous tissues by spreading technique.  The cyst was identified.  It was clear of soft tissue attachments.  It was removed with the synovectomy rongeurs and sent to pathology for examination.  The DIP joint was entered underneath the extensor tendon.  Synovectomy was performed with the synovectomy rongeurs.  Prominent bone at the ulnar side of the joint was taken down including bone from the dorsal aspect of the middle phalanx condyle and at the base of the distal phalanx.  The wound and joint were copiously irrigated with sterile saline.  The wound was closed with 4-0 nylon in a horizontal mattress fashion.  Digital block was performed with quarter percent plain Marcaine  to aid in postoperative analgesia.  The wound was dressed with sterile Xeroform and 4 x 4 and wrapped with a Coban dressing lightly.  AlumaFoam splint was placed and wrapped lightly with Coban dressing.  The tourniquet was deflated at 27 minutes.  Fingertips were pink with brisk capillary refill after deflation of tourniquet.  The operative  drapes were broken down.  The patient was awoken from anesthesia safely.  She was transferred back to the stretcher and taken to PACU  in stable condition.  I will see her back in the office in 1 week for postoperative followup.  I will give her a prescription for Norco 5/325 1 tab PO q6 hours prn pain, dispense #15.   Mclean Moya, MD Electronically signed, 04/01/24

## 2024-04-01 NOTE — Anesthesia Procedure Notes (Signed)
 Anesthesia Regional Block: Bier block (IV Regional)   Pre-Anesthetic Checklist: , timeout performed,  Correct Patient, Correct Site, Correct Laterality,  Correct Procedure, Correct Position, site marked,  Risks and benefits discussed,  Surgical consent,  Pre-op evaluation,  At surgeon's request and post-op pain management  Laterality: Left  Prep: alcohol swabs        Procedures:,,,,, intact distal pulses, single tourniquet utilized,  #20gu IV placed    Narrative:  Anesthesiologist: Keneth Lynwood POUR, MD CRNA: Burnard Rosaline HERO, CRNA

## 2024-04-01 NOTE — H&P (Addendum)
 Jeanette Aguirre is an 63 y.o. female.   Chief Complaint: mucoid cyst HPI: 63 yo female with left long finger mucoid cyst and dip arthritis.  This is bothersome to her.  She wishes to have it removed and the dip joint debrided to try to prevent recurrence.  Allergies:  Allergies  Allergen Reactions   Lisinopril  Swelling    Lip swelling/tingling/itching   Betadine  [Povidone Iodine ] Swelling   Povidone-Iodine  Swelling   Ciprofloxacin Nausea Only    Past Medical History:  Diagnosis Date   Anxiety    Depression    Diabetes mellitus without complication (HCC)    Family history of adverse reaction to anesthesia    Gastroparesis    GERD (gastroesophageal reflux disease)    Hyperlipemia    Pneumonia    PONV (postoperative nausea and vomiting)     Past Surgical History:  Procedure Laterality Date   ADENOIDECTOMY     BIOPSY  08/19/2021   Procedure: BIOPSY;  Surgeon: Shaaron [name], MD;  [facility];  Service: Endoscopy;;   CHOLECYSTECTOMY     COLONOSCOPY N/A 09/04/2017   Dr. Shaaron: multiple tubular adenomas removed, diverticulosis. next TCS in 3 years.    COLONOSCOPY N/A 01/18/2021   Procedure: COLONOSCOPY;  Surgeon: Shaaron [name], MD;  [facility];  Service: Endoscopy;  Laterality: N/A;  AM (diabetic)   ESOPHAGOGASTRODUODENOSCOPY N/A 04/09/2018   Dr. shaaron: inflammation of esophagus c/w acid reflux.    ESOPHAGOGASTRODUODENOSCOPY (EGD) WITH PROPOFOL  N/A 08/19/2021   Procedure: ESOPHAGOGASTRODUODENOSCOPY (EGD) WITH PROPOFOL ;  Surgeon: Shaaron [name], MD;  [facility];  Service: Endoscopy;  Laterality: N/A;  11:15am   LIPOMA EXCISION Right 03/07/2020   Procedure: EXCISION LIPOMA, THIGH;  Surgeon: Mavis Anes, MD;  Location: [facility];  Service: General;  Laterality: Right;   POLYPECTOMY  01/18/2021   Procedure: POLYPECTOMY;  Surgeon: Shaaron [name], MD;  [facility];  Service: Endoscopy;;   TONSILLECTOMY     and adenoids   TUBAL  LIGATION     TYMPANOSTOMY TUBE PLACEMENT      Family History: Family History  Problem Relation Age of Onset   Diabetes Mother    Peripheral vascular disease Mother    Diabetes Father    Colitis Daughter 60       lymphocytic colitis   Colon cancer Neg Hx     Social History:   reports that she quit smoking about 7 years ago. Her smoking use included cigarettes. She started smoking about 22 years ago. She has a 15 pack-year smoking history. She has never used smokeless tobacco. She reports current alcohol use. She reports that she does not use drugs.  Medications: Medications Prior to Admission  Medication Sig Dispense Refill   acarbose (PRECOSE) 50 MG tablet Take 50 mg by mouth 3 (three) times daily.     atorvastatin  (LIPITOR) 20 MG tablet Take 20 mg by mouth at bedtime.      Cholecalciferol (VITAMIN D3) 125 MCG (5000 UT) CAPS Take 1 capsule by mouth daily.     Cyanocobalamin  (B-12 COMPLIANCE INJECTION IJ) Inject as directed every 30 (thirty) days.     DULoxetine (CYMBALTA) 60 MG capsule Take 60 mg by mouth.     glimepiride  (AMARYL ) 4 MG tablet Take 4 mg by mouth 2 (two) times daily with breakfast and lunch.     JANUVIA 100 MG tablet Take 100 mg by mouth daily.     JARDIANCE 25 MG TABS tablet Take  25 mg by mouth every morning.     losartan (COZAAR) 25 MG tablet Take 1 tablet by mouth daily.     metFORMIN (GLUCOPHAGE) 1000 MG tablet Take 1,000 mg by mouth 2 (two) times daily.     metoCLOPramide  (REGLAN ) 10 MG tablet Take 10 mg by mouth. Prn     pantoprazole  (PROTONIX ) 40 MG tablet Take 1 tablet (40 mg total) by mouth 2 (two) times daily before a meal. For one month, then go back to once daily. 60 tablet 3   albuterol  (VENTOLIN  HFA) 108 (90 Base) MCG/ACT inhaler Inhale 1-2 puffs into the lungs every 6 (six) hours as needed for wheezing or shortness of breath. 6.7 g 0    Results for orders placed or performed during the hospital encounter of 04/01/24 (from the past 48 hours)  Glucose,  capillary     Status: Abnormal   Collection Time: 04/01/24 11:27 AM  Result Value Ref Range   Glucose-Capillary 163 (H) 70 - 99 mg/dL    Comment: Glucose reference range applies only to samples taken after fasting for at least 8 hours.    No results found.    Blood pressure (!) 145/85, pulse 88, temperature (!) 97.5 F (36.4 C), temperature source Temporal, resp. rate 16, height 5' 6 (1.676 m), weight 100.7 kg, last menstrual period 03/03/2012, SpO2 96%.  General appearance: alert, cooperative, and appears stated age Head: Normocephalic, without obvious abnormality, atraumatic Neck: supple, symmetrical, trachea midline Extremities: Intact sensation and capillary refill all digits.  +epl/fpl/io.  No wounds. At the dorsum of the long finger at the dip joint there is a nodule consistent with mucoid cyst.  It is slightly mobile under the skin.  No skin changes.  No nail change. Skin: Skin color, texture, turgor normal. 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  XRay shows degenerative changes at dip joint with cornering of joint and cyst at radial condyle middle phalanx.  Non operative and operative treatment options have been discussed with the patient and patient wishes to proceed with operative treatment. Risks, benefits and alternatives of surgery were discussed including risks of blood loss, infection, damage to nerves/vessels/tendons/ligament/bone, failure of surgery, need for additional surgery, complication with wound healing, stiffness, recurrence.  She voiced understanding of these risks and elected to proceed.    Jeanette Aguirre 04/01/2024, 1:17 PM

## 2024-04-01 NOTE — Anesthesia Postprocedure Evaluation (Signed)
 Anesthesia Post Note  Patient: Jeanette Aguirre  Procedure(s) Performed: LEFT MIDDLE FINGER MUCOID CYST EXCISION AND DEBRIDEMENT DISTAL INTERPHALANGEAL JOINT (Left: Finger)     Patient location during evaluation: PACU Anesthesia Type: Bier Block Level of consciousness: awake and alert Pain management: pain level controlled Vital Signs Assessment: post-procedure vital signs reviewed and stable Respiratory status: spontaneous breathing, nonlabored ventilation, respiratory function stable and patient connected to nasal cannula oxygen Cardiovascular status: blood pressure returned to baseline and stable Postop Assessment: no apparent nausea or vomiting Anesthetic complications: no   No notable events documented.  Last Vitals:  Vitals:   04/01/24 1400 04/01/24 1430  BP: (!) 139/57 135/70  Pulse: 86 84  Resp: 17 16  Temp: (!) 36.2 C (!) 36.3 C  SpO2: 94% 94%    Last Pain:  Vitals:   04/01/24 1430  TempSrc:   PainSc: 0-No pain                 [name]

## 2024-04-01 NOTE — Anesthesia Preprocedure Evaluation (Signed)
 Anesthesia Evaluation  Patient identified by MRN, date of birth, ID band Patient awake    Reviewed: Allergy & Precautions, NPO status , Patient's Chart, lab work & pertinent test results, reviewed documented beta blocker date and time   History of Anesthesia Complications (+) PONV, Family history of anesthesia reaction and history of anesthetic complications  Airway Mallampati: II  TM Distance: >3 FB     Dental no notable dental hx.    Pulmonary neg shortness of breath, pneumonia, resolved, former smoker   breath sounds clear to auscultation       Cardiovascular (-) angina (-) CAD and (-) Past MI  Rhythm:Regular Rate:Normal     Neuro/Psych neg Seizures PSYCHIATRIC DISORDERS Anxiety Depression       GI/Hepatic ,GERD  ,,Hx gastroparesis however previous GES appears only mildly prolonged. Appropriately NPO, >16 hrs. Denies any current nausea or recent vomiting.    Endo/Other  diabetes, Type 2    Renal/GU      Musculoskeletal   Abdominal   Peds  Hematology  (+) Blood dyscrasia, anemia   Anesthesia Other Findings   Reproductive/Obstetrics                              Anesthesia Physical Anesthesia Plan  ASA: 2  Anesthesia Plan: Bier Block and Bier Block-Lidocaine  Only   Post-op Pain Management:    Induction: Intravenous  PONV Risk Score and Plan: 2 and Ondansetron  and Propofol  infusion  Airway Management Planned: Simple Face Mask and Natural Airway  Additional Equipment:   Intra-op Plan:   Post-operative Plan:   Informed Consent: I have reviewed the patients History and Physical, chart, labs and discussed the procedure including the risks, benefits and alternatives for the proposed anesthesia with the patient or authorized representative who has indicated his/her understanding and acceptance.     Dental advisory given  Plan Discussed with: CRNA  Anesthesia Plan Comments:           Anesthesia Quick Evaluation

## 2024-04-01 NOTE — Transfer of Care (Signed)
 Immediate Anesthesia Transfer of Care Note  Patient: Jeanette Aguirre  Procedure(s) Performed: LEFT MIDDLE FINGER MUCOID CYST EXCISION AND DEBRIDEMENT DISTAL INTERPHALANGEAL JOINT (Left: Finger)  Patient Location: PACU  Anesthesia Type:MAC  Level of Consciousness: awake, alert , and oriented  Airway & Oxygen Therapy: Patient Spontanous Breathing and Patient connected to face mask oxygen  Post-op Assessment: Report given to RN and Post -op Vital signs reviewed and stable  Post vital signs: Reviewed and stable  Last Vitals:  Vitals Value Taken Time  BP    Temp    Pulse 86 04/01/24 14:00  Resp 17 04/01/24 14:00  SpO2 94 % 04/01/24 14:00  Vitals shown include unfiled device data.  Last Pain:  Vitals:   04/01/24 1117  TempSrc: Temporal  PainSc: 0-No pain      Patients Stated Pain Goal: 4 (04/01/24 1117)  Complications: No notable events documented.

## 2024-04-02 ENCOUNTER — Encounter (HOSPITAL_BASED_OUTPATIENT_CLINIC_OR_DEPARTMENT_OTHER): Payer: Self-pay | Admitting: Orthopedic Surgery

## 2024-04-03 ENCOUNTER — Emergency Department (HOSPITAL_COMMUNITY)

## 2024-04-03 ENCOUNTER — Emergency Department (HOSPITAL_COMMUNITY): Admission: EM | Admit: 2024-04-03 | Discharge: 2024-04-03 | Disposition: A | Discharge status: Home / Self Care

## 2024-04-03 ENCOUNTER — Encounter (HOSPITAL_COMMUNITY): Payer: Self-pay | Admitting: Emergency Medicine

## 2024-04-03 ENCOUNTER — Other Ambulatory Visit: Payer: Self-pay

## 2024-04-03 DIAGNOSIS — E1165 Type 2 diabetes mellitus with hyperglycemia: Secondary | ICD-10-CM | POA: Insufficient documentation

## 2024-04-03 DIAGNOSIS — Z79899 Other long term (current) drug therapy: Secondary | ICD-10-CM | POA: Diagnosis not present

## 2024-04-03 DIAGNOSIS — R0789 Other chest pain: Secondary | ICD-10-CM | POA: Diagnosis present

## 2024-04-03 DIAGNOSIS — J181 Lobar pneumonia, unspecified organism: Secondary | ICD-10-CM | POA: Diagnosis not present

## 2024-04-03 DIAGNOSIS — D649 Anemia, unspecified: Secondary | ICD-10-CM | POA: Diagnosis not present

## 2024-04-03 DIAGNOSIS — J189 Pneumonia, unspecified organism: Secondary | ICD-10-CM

## 2024-04-03 DIAGNOSIS — R791 Abnormal coagulation profile: Secondary | ICD-10-CM | POA: Insufficient documentation

## 2024-04-03 LAB — URINALYSIS, ROUTINE W REFLEX MICROSCOPIC
Bacteria, UA: NONE SEEN
Bilirubin Urine: NEGATIVE
Glucose, UA: >=500 mg/dL — AB
Hgb urine dipstick: NEGATIVE
Ketones, ur: NEGATIVE mg/dL
Leukocytes,Ua: NEGATIVE
Nitrite: NEGATIVE
Protein, ur: NEGATIVE mg/dL
Specific Gravity, Urine: 1.033 — ABNORMAL HIGH (ref 1.005–1.030)
pH: 6 (ref 5.0–8.0)

## 2024-04-03 LAB — CBC WITH DIFFERENTIAL/PLATELET
Abs Immature Granulocytes: 0.04 K/uL (ref 0.00–0.07)
Basophils Absolute: 0 K/uL (ref 0.0–0.1)
Basophils Relative: 0 %
Eosinophils Absolute: 0.1 K/uL (ref 0.0–0.5)
Eosinophils Relative: 1 %
HCT: 34.8 % — ABNORMAL LOW (ref 36.0–46.0)
Hemoglobin: 10.5 g/dL — ABNORMAL LOW (ref 12.0–15.0)
Immature Granulocytes: 0 %
Lymphocytes Relative: 20 %
Lymphs Abs: 2.1 K/uL (ref 0.7–4.0)
MCH: 24.9 pg — ABNORMAL LOW (ref 26.0–34.0)
MCHC: 30.2 g/dL (ref 30.0–36.0)
MCV: 82.7 fL (ref 80.0–100.0)
Monocytes Absolute: 0.5 K/uL (ref 0.1–1.0)
Monocytes Relative: 5 %
Neutro Abs: 7.7 K/uL (ref 1.7–7.7)
Neutrophils Relative %: 74 %
Platelets: 349 K/uL (ref 150–400)
RBC: 4.21 MIL/uL (ref 3.87–5.11)
RDW: 14.2 % (ref 11.5–15.5)
WBC: 10.5 K/uL (ref 4.0–10.5)
nRBC: 0 % (ref 0.0–0.2)

## 2024-04-03 LAB — COMPREHENSIVE METABOLIC PANEL WITH GFR
ALT: 15 U/L (ref 0–44)
AST: 16 U/L (ref 15–41)
Albumin: 3.3 g/dL — ABNORMAL LOW (ref 3.5–5.0)
Alkaline Phosphatase: 107 U/L (ref 38–126)
Anion gap: 15 (ref 5–15)
BUN: 10 mg/dL (ref 6–20)
CO2: 24 mmol/L (ref 22–32)
Calcium: 8.7 mg/dL — ABNORMAL LOW (ref 8.9–10.3)
Chloride: 96 mmol/L — ABNORMAL LOW (ref 98–111)
Creatinine, Ser: 0.7 mg/dL (ref 0.44–1.00)
GFR, Estimated: >60 mL/min (ref >60)
Glucose, Bld: 259 mg/dL — ABNORMAL HIGH (ref 70–99)
Potassium: 3.5 mmol/L (ref 3.5–5.1)
Sodium: 135 mmol/L (ref 135–145)
Total Bilirubin: 0.6 mg/dL (ref 0.0–1.2)
Total Protein: 7 g/dL (ref 6.5–8.1)

## 2024-04-03 LAB — CBG MONITORING, ED: Glucose-Capillary: 186 mg/dL — ABNORMAL HIGH (ref 70–99)

## 2024-04-03 LAB — LIPASE, BLOOD: Lipase: 26 U/L (ref 11–51)

## 2024-04-03 LAB — D-DIMER, QUANTITATIVE: D-Dimer, Quant: 0.95 ug{FEU}/mL — ABNORMAL HIGH (ref 0.00–0.50)

## 2024-04-03 MED ORDER — ACETAMINOPHEN 325 MG PO TABS
650.0000 mg | ORAL_TABLET | Freq: Once | ORAL | Status: AC
Start: 2024-04-03 — End: 2024-04-03
  Administered 2024-04-03: 650 mg via ORAL
  Filled 2024-04-03: qty 2

## 2024-04-03 MED ORDER — SODIUM CHLORIDE 0.9 % IV SOLN
1.0000 g | Freq: Once | INTRAVENOUS | Status: AC
  Administered 2024-04-03: 1 g via INTRAVENOUS
  Filled 2024-04-03: qty 10

## 2024-04-03 MED ORDER — IOHEXOL 350 MG/ML SOLN
75.0000 mL | Freq: Once | INTRAVENOUS | Status: AC | PRN
  Administered 2024-04-03: 75 mL via INTRAVENOUS

## 2024-04-03 MED ORDER — AZITHROMYCIN 250 MG PO TABS: ORAL_TABLET | ORAL | 0 refills | Status: DC

## 2024-04-03 MED ORDER — AZITHROMYCIN 250 MG PO TABS
500.0000 mg | ORAL_TABLET | Freq: Once | ORAL | Status: AC
Start: 2024-04-03 — End: 2024-04-03
  Administered 2024-04-03: 500 mg via ORAL
  Filled 2024-04-03: qty 2

## 2024-04-03 NOTE — ED Provider Notes (Signed)
 [facility] AT [facility] Provider Note   CSN: [id number] Arrival date & time: 04/03/24  1342     Patient presents with: Shortness of Breath and Abdominal Pain   Jeanette Aguirre is a 63 y.o. female with a history including type 2 diabetes, hyperlipidemia, history of GERD, gastroparesis, pneumonia surgical history significant for cholecystectomy presenting for evaluation of lower chest/ upper abdominal pain and pain with inspiration.  She is 2 days out from an outpatient surgery for a finger cyst excision.  That evening at home she developed these symptoms. The pain is tight feeling,  bilateral lower chest which radiates around to her back,  bilaterally and down both flanks,  worse with deep inspiration, mild shortness of breath.  She denies  palpitations, nausea, vomiting, abdominal distention.  She states the last time she had similar symptoms to this she was diagnosed with pneumonia.  She states she has been running a fever for her,  between 99.2 and 99.8 which is high for her.  She has taken Tylenol  but predominantly for her postoperative finger pain, her presenting pain has been well-tolerated till now.  {Add pertinent medical, surgical, social history, OB history to YEP:67052} The history is provided by the patient.       Prior to Admission medications   Medication Sig Start Date End Date Taking? Authorizing Provider  acarbose (PRECOSE) 50 MG tablet Take 50 mg by mouth 3 (three) times daily.    [provider]  albuterol  (VENTOLIN  HFA) 108 (90 Base) MCG/ACT inhaler Inhale 1-2 puffs into the lungs every 6 (six) hours as needed for wheezing or shortness of breath. 01/20/24   Chandra Harlene LABOR, NP  atorvastatin  (LIPITOR) 20 MG tablet Take 20 mg by mouth at bedtime.     [provider]  Cholecalciferol (VITAMIN D3) 125 MCG (5000 UT) CAPS Take 1 capsule by mouth daily.    [provider]  Cyanocobalamin  (B-12 COMPLIANCE INJECTION IJ)  Inject as directed every 30 (thirty) days.    [provider]  DULoxetine (CYMBALTA) 60 MG capsule Take 60 mg by mouth.    [provider]  glimepiride  (AMARYL ) 4 MG tablet Take 4 mg by mouth 2 (two) times daily with breakfast and lunch.    [provider]  HYDROcodone -acetaminophen  (NORCO/VICODIN) 5-325 MG tablet Take 1 tablet by mouth every 6 (six) hours as needed for moderate pain (pain score 4-6). 04/01/24   Kuzma, Kevin, MD  JANUVIA 100 MG tablet Take 100 mg by mouth daily.    [provider]  JARDIANCE 25 MG TABS tablet Take 25 mg by mouth every morning. 11/12/20   [provider]  losartan (COZAAR) 25 MG tablet Take 1 tablet by mouth daily. 11/02/23   [provider]  metFORMIN (GLUCOPHAGE) 1000 MG tablet Take 1,000 mg by mouth 2 (two) times daily.    [provider]  metoCLOPramide  (REGLAN ) 10 MG tablet Take 10 mg by mouth. Prn    [provider]  pantoprazole  (PROTONIX ) 40 MG tablet Take 1 tablet (40 mg total) by mouth 2 (two) times daily before a meal. For one month, then go back to once daily. 08/30/21   Ezzard Sonny RAMAN, PA-C    Allergies: Lisinopril , Betadine  [povidone iodine ], Povidone-iodine , and Ciprofloxacin    Review of Systems  Constitutional:  Negative for chills and fever.  HENT:  Negative for congestion and sore throat.   Eyes: Negative.   Respiratory:  Positive for chest tightness and shortness  of breath.   Cardiovascular:  Negative for chest pain.  Gastrointestinal:  Negative for abdominal pain, nausea and vomiting.  Genitourinary: Negative.   Musculoskeletal:  Negative for arthralgias, joint swelling and neck pain.  Skin: Negative.  Negative for rash and wound.  Neurological:  Negative for dizziness, weakness, light-headedness, numbness and headaches.  Psychiatric/Behavioral: Negative.      Updated Vital Signs BP 126/75 (BP Location: Right Arm)   Pulse 91   Temp 99.3 F (37.4 C) (Oral)   Resp  18   Ht 5' 6 (1.676 m)   Wt 100.2 kg   LMP 03/03/2012   SpO2 92%   BMI 35.67 kg/m   Physical Exam Vitals and nursing note reviewed.  Constitutional:      Appearance: She is well-developed.  HENT:     Head: Normocephalic and atraumatic.  Eyes:     Conjunctiva/sclera: Conjunctivae normal.  Cardiovascular:     Rate and Rhythm: Normal rate and regular rhythm.     Heart sounds: Normal heart sounds.  Pulmonary:     Effort: Pulmonary effort is normal.     Breath sounds: Normal breath sounds. No wheezing or rhonchi.  Abdominal:     General: Bowel sounds are normal.     Palpations: Abdomen is soft.     Tenderness: There is no abdominal tenderness.  Musculoskeletal:        General: Normal range of motion.     Cervical back: Normal range of motion.  Skin:    General: Skin is warm and dry.  Neurological:     Mental Status: She is alert.     (all labs ordered are listed, but only abnormal results are displayed) Labs Reviewed  COMPREHENSIVE METABOLIC PANEL WITH GFR - Abnormal; Notable for the following components:      Result Value   Chloride 96 (*)    Glucose, Bld 259 (*)    Calcium  8.7 (*)    Albumin 3.3 (*)    All other components within normal limits  URINALYSIS, ROUTINE W REFLEX MICROSCOPIC - Abnormal; Notable for the following components:   Color, Urine STRAW (*)    Specific Gravity, Urine 1.033 (*)    Glucose, UA >=500 (*)    All other components within normal limits  CBC WITH DIFFERENTIAL/PLATELET - Abnormal; Notable for the following components:   Hemoglobin 10.5 (*)    HCT 34.8 (*)    MCH 24.9 (*)    All other components within normal limits  D-DIMER, QUANTITATIVE - Abnormal; Notable for the following components:   D-Dimer, Quant 0.95 (*)    All other components within normal limits  CBG MONITORING, ED - Abnormal; Notable for the following components:   Glucose-Capillary 186 (*)    All other components within normal limits  LIPASE, BLOOD    EKG: EKG  Interpretation Date/Time:  Sunday April 03 2024 14:32:04 EDT Ventricular Rate:  93 PR Interval:  172 QRS Duration:  96 QT Interval:  353 QTC Calculation: 439 R Axis:   19  Text Interpretation: Sinus rhythm Normal intervals No STEMI NO acute change when compared with prior EKG from 01/23/2024 Confirmed by Gennaro Bouchard ([id number]) on 04/03/2024 2:36:59 PM  Radiology: CT Angio Chest PE W and/or Wo Contrast Result Date: 04/03/2024 CLINICAL DATA:  Shortness of breath starting yesterday. Pleuritic diaphragmatic pain. EXAM: CT ANGIOGRAPHY CHEST WITH CONTRAST TECHNIQUE: Multidetector CT imaging of the chest was performed using the standard protocol during bolus administration of intravenous contrast. Multiplanar CT image reconstructions and MIPs  were obtained to evaluate the vascular anatomy. RADIATION DOSE REDUCTION: This exam was performed according to the departmental dose-optimization program which includes automated exposure control, adjustment of the mA and/or kV according to patient size and/or use of iterative reconstruction technique. CONTRAST:  75mL OMNIPAQUE  IOHEXOL  350 MG/ML SOLN COMPARISON:  Chest radiograph 04/03/2024 and CT chest 10/07/2017 FINDINGS: Cardiovascular: No filling defect is identified in the pulmonary arterial tree to suggest pulmonary embolus. Mild atheromatous vascular calcification of the aortic arch and left anterior descending coronary artery. Mediastinum/Nodes: Unremarkable Lungs/Pleura: Mild bilateral lower lobe atelectasis near the diaphragms. Consolidation posteromedially in the left lower lobe as on image 76 series 6, favoring bacterial pneumonia pattern. Upper Abdomen: Unremarkable Musculoskeletal: Unremarkable Review of the MIP images confirms the above findings. IMPRESSION: 1. No filling defect is identified in the pulmonary arterial tree to suggest pulmonary embolus. 2. Consolidation posteromedially in the left lower lobe favoring bacterial pneumonia pattern. 3. Mild bilateral  lower lobe atelectasis near the diaphragms. 4.  Aortic Atherosclerosis (ICD10-I70.0). Electronically Signed   By: Ryan Salvage M.D.   On: 04/03/2024 17:07   DG Chest 2 View Result Date: 04/03/2024 CLINICAL DATA:  Pleuritic chest pain and shortness of breath beginning yesterday. EXAM: CHEST - 2 VIEW COMPARISON:  01/23/2024 FINDINGS: The heart size and mediastinal contours are within normal limits. Mild streaky opacity in left lung base is new since previous study, and may be due to atelectasis or infiltrate. Right lung is clear. No pleural effusion. IMPRESSION: New mild left basilar atelectasis versus infiltrate. Electronically Signed   By: Norleen DELENA Kil M.D.   On: 04/03/2024 14:48    {Document cardiac monitor, telemetry assessment procedure when appropriate:32947} Procedures   Medications Ordered in the ED  cefTRIAXone  (ROCEPHIN ) 1 g in sodium chloride  0.9 % 100 mL IVPB (has no administration in time range)  azithromycin  (ZITHROMAX ) tablet 500 mg (has no administration in time range)  acetaminophen  (TYLENOL ) tablet 650 mg (has no administration in time range)  iohexol  (OMNIPAQUE ) 350 MG/ML injection 75 mL (75 mLs Intravenous Contrast Given 04/03/24 1627)    Clinical Course as of 04/03/24 1736  Sun Apr 03, 2024  1422 EKG not crossing over into EPIC - Interpreted by me in the absence of cardiology and shows sinus rhythm, rate 98, normal intervals, no STEMI [MK]    Clinical Course User Index [MK] Gennaro, Duwaine CROME, DO   {Click here for ABCD2, HEART and other calculators REFRESH Note before signing:1}                              Medical Decision Making Patient presenting with pleuritic chest pain, anterior with radiation bilaterally around to her flanks in association with perceived fever, shortness of breath.  States symptoms are similar to prior pneumonia.  Additional differential to include PE, bronchitis, musculoskeletal pain/costochondritis.  No history of trauma.  Atypical  ACS.  Results as outlined below, patient has CAP.  She is given Rocephin  IV, Zithromax  500 mg p.o.  She appears stable for discharge home with close follow-up, she was prescribed additional Zithromax .  Return precautions were outlined.  Amount and/or Complexity of Data Reviewed Labs: ordered.    Details: Labs reviewed, patient has a glucose of 186, urinalysis is negative for infection, she just glucose greater than 500, D-dimer obtained, unfortunately it is elevated at 0.95, CT imaging was completed to rule out PE.  Her CMET revealing for a mild hypocalcemia at 8.7, otherwise unremarkable.  She has a normal WBC count at 10.5.  Her hemoglobin is 10.5 which is within the range for lab variation. Radiology: ordered.    Details: Chest x-ray is suggesting left basilar atelectasis versus infiltrate.  CT angio chest confirms pneumonia, no PE. ECG/medicine tests: ordered.    Details: Normal sinus rhythm with rate 93  Risk OTC drugs. Prescription drug management.     {Document critical care time when appropriate  Document review of labs and clinical decision tools ie CHADS2VASC2, etc  Document your independent review of radiology images and any outside records  Document your discussion with family members, caretakers and with consultants  Document social determinants of health affecting pt's care  Document your decision making why or why not admission, treatments were needed:32947:::1}   Final diagnoses:  None    ED Discharge Orders     None

## 2024-04-03 NOTE — ED Triage Notes (Signed)
 Pt via POV c/o circumferential upper abdominal pain and SOB since yesterday. Pt notes diaphragmatic pain with deep inhalation. Pain rated 5/10. Lungs clear bilaterally.

## 2024-04-03 NOTE — ED Notes (Signed)
 Pt ambulated with [place], NT and she reports the patient's HR was 90s-100 and her oxygen level maintained between 92-97% on room air. Pt reports she felt fine.

## 2024-04-03 NOTE — ED Notes (Signed)
 ..  The patient is A&OX4, ambulatory at d/c with independent steady gait, NAD. Pt verbalized understanding of d/c instructions, prescription and follow up care.

## 2024-04-03 NOTE — Discharge Instructions (Addendum)
 Rest make sure you are drinking plenty of fluids.  Take your next dose of the antibiotic tomorrow morning.  Get rechecked for any worsening symptoms including high fevers, increasing weakness or shortness of breath.  You may take Tylenol  or Motrin  for any fevers you develop and this may also help with the pain you are experiencing with deep inspiration.

## 2024-04-04 LAB — SURGICAL PATHOLOGY

## 2024-04-04 NOTE — Telephone Encounter (Signed)
 FYI Mindy/Tammy R

## 2024-04-04 NOTE — Progress Notes (Signed)
 Called pt regarding EGD/Colonoscopy scheduled for this Thursday, 04/07/24.  States she was in ED yesterday and has pneumonia.  Wants to reschedule procedures.  Message sent to Dr. Ivonne office.

## 2024-04-07 ENCOUNTER — Encounter (HOSPITAL_COMMUNITY): Admission: RE | Payer: Self-pay | Source: Home / Self Care

## 2024-04-07 ENCOUNTER — Ambulatory Visit (HOSPITAL_COMMUNITY): Admission: RE | Admit: 2024-04-07 | Source: Home / Self Care | Admitting: Internal Medicine

## 2024-04-07 SURGERY — COLONOSCOPY
Anesthesia: Choice

## 2024-04-22 ENCOUNTER — Ambulatory Visit (INDEPENDENT_AMBULATORY_CARE_PROVIDER_SITE_OTHER): Admitting: Otolaryngology

## 2024-04-22 ENCOUNTER — Encounter (INDEPENDENT_AMBULATORY_CARE_PROVIDER_SITE_OTHER): Payer: Self-pay | Admitting: Otolaryngology

## 2024-04-22 VITALS — BP 136/79 | HR 84 | Ht 66.0 in | Wt 223.0 lb

## 2024-04-22 DIAGNOSIS — R040 Epistaxis: Secondary | ICD-10-CM | POA: Diagnosis not present

## 2024-04-22 NOTE — Progress Notes (Signed)
 CC: Recurrent right epistaxis  HPI:  Jeanette Aguirre is a 63 y.o. female who presents today complaining of recurrent right epistaxis for the past 2 months.  The bleeding is both anterior and posterior.  Her last bleeding episode was earlier this morning.  She has been having 1-2 episodes of bleeding a week.  She denies any recent nasal trauma.  She is not on any blood thinner.  She has no previous nasal surgery.  She underwent adenotonsillectomy and bilateral myringotomy and tube placement as a child.  The patient denies any family history of clotting disorder.  Past Medical History:  Diagnosis Date   Anxiety    Depression    Diabetes mellitus without complication (HCC)    Family history of adverse reaction to anesthesia    Gastroparesis    GERD (gastroesophageal reflux disease)    Hyperlipemia    Pneumonia    PONV (postoperative nausea and vomiting)     Past Surgical History:  Procedure Laterality Date   ADENOIDECTOMY     BIOPSY  08/19/2021   Procedure: BIOPSY;  Surgeon: Shaaron [name], MD;  Location: [facility];  Service: Endoscopy;;   CHOLECYSTECTOMY     COLONOSCOPY N/A 09/04/2017   Dr. Shaaron: multiple tubular adenomas removed, diverticulosis. next TCS in 3 years.    COLONOSCOPY N/A 01/18/2021   Procedure: COLONOSCOPY;  Surgeon: Shaaron [name], MD;  Location: [facility];  Service: Endoscopy;  Laterality: N/A;  AM (diabetic)   CYST REMOVAL HAND Left 04/01/2024   Procedure: LEFT MIDDLE FINGER MUCOID CYST EXCISION AND DEBRIDEMENT DISTAL INTERPHALANGEAL JOINT;  Surgeon: Murrell Drivers, MD;  Location: [facility];  Service: Orthopedics;  Laterality: Left;  Bier block   ESOPHAGOGASTRODUODENOSCOPY N/A 04/09/2018   Dr. shaaron: inflammation of esophagus c/w acid reflux.    ESOPHAGOGASTRODUODENOSCOPY (EGD) WITH PROPOFOL  N/A 08/19/2021   Procedure: ESOPHAGOGASTRODUODENOSCOPY (EGD) WITH PROPOFOL ;  Surgeon: Shaaron [name], MD;  Location: [facility];  Service: Endoscopy;   Laterality: N/A;  11:15am   LIPOMA EXCISION Right 03/07/2020   Procedure: EXCISION LIPOMA, THIGH;  Surgeon: Mavis Anes, MD;  Location: [facility];  Service: General;  Laterality: Right;   POLYPECTOMY  01/18/2021   Procedure: POLYPECTOMY;  Surgeon: Shaaron [name], MD;  Location: [facility];  Service: Endoscopy;;   TONSILLECTOMY     and adenoids   TUBAL LIGATION     TYMPANOSTOMY TUBE PLACEMENT      Family History  Problem Relation Age of Onset   Diabetes Mother    Peripheral vascular disease Mother    Diabetes Father    Colitis Daughter 94       lymphocytic colitis   Colon cancer Neg Hx     Social History:  reports that she quit smoking about 7 years ago. Her smoking use included cigarettes. She started smoking about 22 years ago. She has a 15 pack-year smoking history. She has never used smokeless tobacco. She reports current alcohol use. She reports that she does not use drugs.  Allergies:  Allergies  Allergen Reactions   Lisinopril  Swelling    Lip swelling/tingling/itching   Betadine  [Povidone Iodine ] Swelling   Povidone-Iodine  Swelling   Ciprofloxacin Nausea Only    Prior to Admission medications   Medication Sig Start Date End Date Taking? Authorizing Provider  acarbose (PRECOSE) 50 MG tablet Take 50 mg by mouth 3 (three) times daily.   Yes [provider]  albuterol  (VENTOLIN  HFA) 108 (90 Base) MCG/ACT inhaler Inhale 1-2 puffs  into the lungs every 6 (six) hours as needed for wheezing or shortness of breath. 01/20/24  Yes Chandra Harlene LABOR, NP  atorvastatin  (LIPITOR) 20 MG tablet Take 20 mg by mouth at bedtime.    Yes [provider]  azithromycin  (ZITHROMAX ) 250 MG tablet Take 1 tablet daily until finished. 04/04/24  Yes Idol, Julie, PA-C  Cholecalciferol (VITAMIN D3) 125 MCG (5000 UT) CAPS Take 1 capsule by mouth daily.   Yes [provider]  Cyanocobalamin  (B-12 COMPLIANCE INJECTION IJ) Inject as directed every 30 (thirty) days.   Yes  [provider]  DULoxetine (CYMBALTA) 60 MG capsule Take 60 mg by mouth.   Yes [provider]  glimepiride  (AMARYL ) 4 MG tablet Take 4 mg by mouth 2 (two) times daily with breakfast and lunch.   Yes [provider]  HYDROcodone -acetaminophen  (NORCO/VICODIN) 5-325 MG tablet Take 1 tablet by mouth every 6 (six) hours as needed for moderate pain (pain score 4-6). 04/01/24  Yes Kuzma, Kevin, MD  JANUVIA 100 MG tablet Take 100 mg by mouth daily.   Yes [provider]  JARDIANCE 25 MG TABS tablet Take 25 mg by mouth every morning. 11/12/20  Yes [provider]  losartan (COZAAR) 25 MG tablet Take 1 tablet by mouth daily. 11/02/23  Yes [provider]  metFORMIN (GLUCOPHAGE) 1000 MG tablet Take 1,000 mg by mouth 2 (two) times daily.   Yes [provider]  metoCLOPramide  (REGLAN ) 10 MG tablet Take 10 mg by mouth. Prn   Yes [provider]  pantoprazole  (PROTONIX ) 40 MG tablet Take 1 tablet (40 mg total) by mouth 2 (two) times daily before a meal. For one month, then go back to once daily. 08/30/21  Yes Ezzard Sonny RAMAN, PA-C    Blood pressure 136/79, pulse 84, height 5' 6 (1.676 m), weight 223 lb (101.2 kg), last menstrual period 03/03/2012, SpO2 95%. Exam: General: Communicates without difficulty, well nourished, no acute distress. Head: Normocephalic, no evidence injury, no tenderness, facial buttresses intact without stepoff. Face/sinus: No tenderness to palpation and percussion. Facial movement is normal and symmetric. Eyes: PERRL, EOMI. No scleral icterus, conjunctivae clear. Neuro: CN II exam reveals vision grossly intact.  No nystagmus at any point of gaze. Ears: Auricles well formed without lesions.  Ear canals are intact without mass or lesion.  No erythema or edema is appreciated.  The TMs are intact without fluid. Nose: External evaluation reveals normal support and skin without lesions.  Dorsum is intact.  Anterior rhinoscopy  reveals hypervascular areas on the right nasal septum.  Oral:  Oral cavity and oropharynx are intact, symmetric, without erythema or edema.  Mucosa is moist without lesions. Neck: Full range of motion without pain.  There is no significant lymphadenopathy.  No masses palpable.  Thyroid  bed within normal limits to palpation.  Parotid glands and submandibular glands equal bilaterally without mass.  Trachea is midline. Neuro:  CN 2-12 grossly intact.   Procedure:  Endoscopic control of recurrent right epistaxis. Indication:  Recurrent epistaxis  Description:  The right nasal cavity is sprayed with topical xylocaine  and neo-synephrine.  After adequate anesthesia is achieved, the nasal cavity is examined with a 0 rigid endoscope.  A suction catheter is inserted in parallel with the 0 endoscope, and it is used to suction blood clots from the nasal cavity.  Several hypervascular areas are noted on the anterior and superior portion of the septum. Active bleeding is noted. A silver nitrate stick is inserted in parallel  with the 0 endoscope.  It is used to repeatedly cauterized the hypervascular areas.  Good hemostasis is achieved.  The patient tolerated the procedure well.    Assessment: 1.  Recurrent right epistaxis.  Hypervascular areas are noted on the right anterior and superior nasal septum. 2.  No suspicious mass or lesion is noted today.  Plan: 1.  The physical exam and nasal endoscopy findings are reviewed with the patient. 2.  Endoscopic cauterization of the right nasal septum. 3.  Humidifier and nasal ointment as needed. 4.  The patient will return for reevaluation in 4 weeks, sooner if needed.  Deionna Marcantonio W Caleah Tortorelli 04/22/2024, 9:44 AM

## 2024-04-23 DIAGNOSIS — R040 Epistaxis: Secondary | ICD-10-CM | POA: Insufficient documentation

## 2024-05-24 ENCOUNTER — Ambulatory Visit (INDEPENDENT_AMBULATORY_CARE_PROVIDER_SITE_OTHER): Admitting: Otolaryngology

## 2024-05-24 VITALS — BP 152/77 | HR 93

## 2024-05-24 DIAGNOSIS — R040 Epistaxis: Secondary | ICD-10-CM | POA: Diagnosis not present

## 2024-05-25 NOTE — Progress Notes (Signed)
 Patient ID: Jeanette Aguirre, female   DOB: [birth year], 63 y.o.   MRN: [id number]  Procedure:  Endoscopic control of recurrent right epistaxis  Indication: The patient is a 63 year old female who returns today for her follow-up evaluation.  The patient has a history of recurrent right epistaxis.  At her last visit 1 month ago, hypervascular areas were noted on the right anterior and superior nasal septum.  She was treated with endoscopic cauterization of the right nasal septum.  The patient returns today complaining of 4 additional episodes of right-sided epistaxis.  The bleeding is both anterior and posterior.  She denies any recent nasal trauma.  Description:  The right nasal cavity is sprayed with topical xylocaine  and neo-synephrine.  After adequate anesthesia is achieved, the nasal cavity is examined with a 0 rigid endoscope.  A suction catheter is inserted in parallel with the 0 endoscope, and it is used to suction blood clots from the right nasal cavity.  Hypervascular areas are again noted at the anterior and superior aspect of the nasal septum.  A silver nitrate stick is inserted in parallel with the 0 endoscope.  It is used to repeatedly cauterized the hypervascular areas.  Good hemostasis is achieved.  The patient tolerated the procedure well.  Assessment: 1.  Hypervascular areas are noted on the right anterior and superior nasal septum.   2.  No suspicious mass or lesion is noted today.  Plan: 1. Endoscopic cauterization of the right anterior and superior nasal septum. 2. The nasal endoscopy findings are reviewed with the patient. 3. Nasal ointment/humidifier as needed. 4. The patient will return for re-evaluation in 1 month.

## 2024-06-09 ENCOUNTER — Encounter (HOSPITAL_COMMUNITY)
Admission: RE | Disposition: A | Discharge status: Home / Self Care | Payer: Self-pay | Source: Home / Self Care | Attending: Internal Medicine

## 2024-06-09 ENCOUNTER — Ambulatory Visit (HOSPITAL_COMMUNITY)
Admission: RE | Admit: 2024-06-09 | Discharge: 2024-06-09 | Disposition: A | Discharge status: Home / Self Care | Attending: Internal Medicine | Admitting: Internal Medicine

## 2024-06-09 ENCOUNTER — Other Ambulatory Visit: Payer: Self-pay

## 2024-06-09 ENCOUNTER — Ambulatory Visit (HOSPITAL_COMMUNITY): Admitting: Anesthesiology

## 2024-06-09 ENCOUNTER — Encounter (HOSPITAL_COMMUNITY): Payer: Self-pay | Admitting: Internal Medicine

## 2024-06-09 DIAGNOSIS — Q438 Other specified congenital malformations of intestine: Secondary | ICD-10-CM | POA: Diagnosis not present

## 2024-06-09 DIAGNOSIS — F419 Anxiety disorder, unspecified: Secondary | ICD-10-CM | POA: Diagnosis not present

## 2024-06-09 DIAGNOSIS — Z860101 Personal history of adenomatous and serrated colon polyps: Secondary | ICD-10-CM | POA: Diagnosis present

## 2024-06-09 DIAGNOSIS — Z7984 Long term (current) use of oral hypoglycemic drugs: Secondary | ICD-10-CM | POA: Diagnosis not present

## 2024-06-09 DIAGNOSIS — Z8249 Family history of ischemic heart disease and other diseases of the circulatory system: Secondary | ICD-10-CM | POA: Diagnosis not present

## 2024-06-09 DIAGNOSIS — F32A Depression, unspecified: Secondary | ICD-10-CM | POA: Diagnosis not present

## 2024-06-09 DIAGNOSIS — I10 Essential (primary) hypertension: Secondary | ICD-10-CM | POA: Diagnosis not present

## 2024-06-09 DIAGNOSIS — K449 Diaphragmatic hernia without obstruction or gangrene: Secondary | ICD-10-CM | POA: Insufficient documentation

## 2024-06-09 DIAGNOSIS — Z87891 Personal history of nicotine dependence: Secondary | ICD-10-CM | POA: Diagnosis not present

## 2024-06-09 DIAGNOSIS — Z860100 Personal history of colon polyps, unspecified: Secondary | ICD-10-CM

## 2024-06-09 DIAGNOSIS — Z1211 Encounter for screening for malignant neoplasm of colon: Secondary | ICD-10-CM

## 2024-06-09 DIAGNOSIS — Z833 Family history of diabetes mellitus: Secondary | ICD-10-CM | POA: Diagnosis not present

## 2024-06-09 DIAGNOSIS — R1310 Dysphagia, unspecified: Secondary | ICD-10-CM | POA: Diagnosis present

## 2024-06-09 DIAGNOSIS — E119 Type 2 diabetes mellitus without complications: Secondary | ICD-10-CM | POA: Diagnosis not present

## 2024-06-09 DIAGNOSIS — K219 Gastro-esophageal reflux disease without esophagitis: Secondary | ICD-10-CM | POA: Diagnosis not present

## 2024-06-09 HISTORY — PX: COLONOSCOPY: SHX5424

## 2024-06-09 HISTORY — PX: ESOPHAGEAL DILATION: SHX303

## 2024-06-09 HISTORY — PX: ESOPHAGOGASTRODUODENOSCOPY: SHX5428

## 2024-06-09 LAB — GLUCOSE, CAPILLARY: Glucose-Capillary: 269 mg/dL — ABNORMAL HIGH (ref 70–99)

## 2024-06-09 SURGERY — COLONOSCOPY
Anesthesia: General

## 2024-06-09 MED ORDER — PHENYLEPHRINE 80 MCG/ML (10ML) SYRINGE FOR IV PUSH (FOR BLOOD PRESSURE SUPPORT)
PREFILLED_SYRINGE | INTRAVENOUS | Status: DC | PRN
  Administered 2024-06-09: 160 ug via INTRAVENOUS
  Administered 2024-06-09: 80 ug via INTRAVENOUS

## 2024-06-09 MED ORDER — PROPOFOL 500 MG/50ML IV EMUL
INTRAVENOUS | Status: DC | PRN
  Administered 2024-06-09: 40 mg via INTRAVENOUS
  Administered 2024-06-09: 30 mg via INTRAVENOUS
  Administered 2024-06-09: 40 mg via INTRAVENOUS
  Administered 2024-06-09: 150 ug/kg/min via INTRAVENOUS
  Administered 2024-06-09: 10 mg via INTRAVENOUS
  Administered 2024-06-09: 70 mg via INTRAVENOUS
  Administered 2024-06-09: 10 mg via INTRAVENOUS

## 2024-06-09 MED ORDER — STERILE WATER FOR IRRIGATION IR SOLN
Status: DC | PRN
  Administered 2024-06-09: 120 mL

## 2024-06-09 MED ORDER — LIDOCAINE 2% (20 MG/ML) 5 ML SYRINGE
INTRAMUSCULAR | Status: DC | PRN
  Administered 2024-06-09: 100 mg via INTRAVENOUS

## 2024-06-09 MED ORDER — LACTATED RINGERS IV SOLN: INTRAVENOUS | Status: DC

## 2024-06-09 NOTE — Discharge Instructions (Addendum)
 EGD Discharge instructions Please read the instructions outlined below and refer to this sheet in the next few weeks. These discharge instructions provide you with general information on caring for yourself after you leave the hospital. Your doctor may also give you specific instructions. While your treatment has been planned according to the most current medical practices available, unavoidable complications occasionally occur. If you have any problems or questions after discharge, please call your doctor. ACTIVITY You may resume your regular activity but move at a slower pace for the next 24 hours.  Take frequent rest periods for the next 24 hours.  Walking will help expel (get rid of) the air and reduce the bloated feeling in your abdomen.  No driving for 24 hours (because of the anesthesia (medicine) used during the test).  You may shower.  Do not sign any important legal documents or operate any machinery for 24 hours (because of the anesthesia used during the test).  NUTRITION Drink plenty of fluids.  You may resume your normal diet.  Begin with a light meal and progress to your normal diet.  Avoid alcoholic beverages for 24 hours or as instructed by your caregiver.  MEDICATIONS You may resume your normal medications unless your caregiver tells you otherwise.  WHAT YOU CAN EXPECT TODAY You may experience abdominal discomfort such as a feeling of fullness or "gas" pains.  FOLLOW-UP Your doctor will discuss the results of your test with you.  SEEK IMMEDIATE MEDICAL ATTENTION IF ANY OF THE FOLLOWING OCCUR: Excessive nausea (feeling sick to your stomach) and/or vomiting.  Severe abdominal pain and distention (swelling).  Trouble swallowing.  Temperature over 101 F (37.8 C).  Rectal bleeding or vomiting of blood.    Colonoscopy Discharge Instructions  Read the instructions outlined below and refer to this sheet in the next few weeks. These discharge instructions provide you with  general information on caring for yourself after you leave the hospital. Your doctor may also give you specific instructions. While your treatment has been planned according to the most current medical practices available, unavoidable complications occasionally occur. If you have any problems or questions after discharge, call Dr. Shaaron at [phone]. ACTIVITY You may resume your regular activity, but move at a slower pace for the next 24 hours.  Take frequent rest periods for the next 24 hours.  Walking will help get rid of the air and reduce the bloated feeling in your belly (abdomen).  No driving for 24 hours (because of the medicine (anesthesia) used during the test).   Do not sign any important legal documents or operate any machinery for 24 hours (because of the anesthesia used during the test).  NUTRITION Drink plenty of fluids.  You may resume your normal diet as instructed by your doctor.  Begin with a light meal and progress to your normal diet. Heavy or fried foods are harder to digest and may make you feel sick to your stomach (nauseated).  Avoid alcoholic beverages for 24 hours or as instructed.  MEDICATIONS You may resume your normal medications unless your doctor tells you otherwise.  WHAT YOU CAN EXPECT TODAY Some feelings of bloating in the abdomen.  Passage of more gas than usual.  Spotting of blood in your stool or on the toilet paper.  IF YOU HAD POLYPS REMOVED DURING THE COLONOSCOPY: No aspirin products for 7 days or as instructed.  No alcohol for 7 days or as instructed.  Eat a soft diet for the next 24 hours.  FINDING  OUT THE RESULTS OF YOUR TEST Not all test results are available during your visit. If your test results are not back during the visit, make an appointment with your caregiver to find out the results. Do not assume everything is normal if you have not heard from your caregiver or the medical facility. It is important for you to follow up on all of your test  results.  SEEK IMMEDIATE MEDICAL ATTENTION IF: You have more than a spotting of blood in your stool.  Your belly is swollen (abdominal distention).  You are nauseated or vomiting.  You have a temperature over 101.  You have abdominal pain or discomfort that is severe or gets worse throughout the day.      you have a small hiatal hernia.  Your esophagus was stretched today  No polyps found on colonoscopy  It is recommended you return in 7 years for repeat colonoscopy  Office appointment with us  Kathye Kerns) in 6 weeks.  Office will notify you.

## 2024-06-09 NOTE — Op Note (Signed)
 [facility] Patient Name: Jeanette Aguirre Procedure Date: 06/09/2024 8:28 AM MRN: [id number] Date of Birth: 03/08/1961 Attending MD: Lamar Ozell Hollingshead , MD, [id number] CSN: [id number] Age: 63 Admit Type: Outpatient Procedure:                Upper GI endoscopy Indications:              Dysphagia Providers:                Lamar Ozell Hollingshead, MD, Leandrew Edelman RN, RN,                            Dorcas Lenis, Technician Referring MD:              Medicines:                Propofol  per Anesthesia Complications:            No immediate complications. Estimated Blood Loss:     Estimated blood loss: none. Procedure:                Pre-Anesthesia Assessment:                           - Prior to the procedure, a History and Physical                            was performed, and patient medications and                            allergies were reviewed. The patient's tolerance of                            previous anesthesia was also reviewed. The risks                            and benefits of the procedure and the sedation                            options and risks were discussed with the patient.                            All questions were answered, and informed consent                            was obtained. Prior Anticoagulants: The patient has                            taken no anticoagulant or antiplatelet agents. ASA                            Grade Assessment: III - A patient with severe                            systemic disease. After reviewing the risks and  benefits, the patient was deemed in satisfactory                            condition to undergo the procedure.                           After obtaining informed consent, the endoscope was                            passed under direct vision. Throughout the                            procedure, the patient's blood pressure, pulse, and                            oxygen saturations were  monitored continuously. The                            [id number] ([id number]) Upper was introduced through                            the mouth, and advanced to the second part of                            duodenum. The upper GI endoscopy was accomplished                            without difficulty. The patient tolerated the                            procedure well. Scope In: 9:00:32 AM Scope Out: 9:07:44 AM Total Procedure Duration: 0 hours 7 minutes 12 seconds  Findings:      The examined esophagus was normal.      A small hiatal hernia was present.      The duodenal bulb and second portion of the duodenum were normal. The       scope was withdrawn. Dilation was performed with a Maloney dilator with       mild resistance at 56 Fr. The dilation site was examined following       endoscope reinsertion and showed no change. Estimated blood loss: none. Impression:               - Normal esophagus. Dilated.                           - Small hiatal hernia.                           - Normal duodenal bulb and second portion of the                            duodenum.                           - No specimens collected. Moderate Sedation:      Moderate (conscious) sedation was personally administered  by an       anesthesia professional. The following parameters were monitored: oxygen       saturation, heart rate, blood pressure, and response to care. Recommendation:           - Patient has a contact number available for                            emergencies. The signs and symptoms of potential                            delayed complications were discussed with the                            patient. Return to normal activities tomorrow.                            Written discharge instructions were provided to the                            patient.                           - Advance diet as tolerated.                           - Continue present medications.                           -  Return to my office in 6 weeks. See colonoscopy                            report. Procedure Code(s):        --- Professional ---                           [phone], Esophagogastroduodenoscopy, flexible,                            transoral; diagnostic, including collection of                            specimen(s) by brushing or washing, when performed                            (separate procedure)                           43450, Dilation of esophagus, by unguided sound or                            bougie, single or multiple passes Diagnosis Code(s):        --- Professional ---                           K44.9, Diaphragmatic hernia without obstruction or  gangrene                           R13.10, Dysphagia, unspecified CPT copyright 2022 American [employer] Association. All rights reserved. The codes documented in this report are preliminary and upon coder review may  be revised to meet current compliance requirements. [name]. [name], MD Lamar Ozell Hollingshead, MD 06/09/2024 9:11:33 AM This report has been signed electronically. Number of Addenda: 0

## 2024-06-09 NOTE — Transfer of Care (Signed)
 Immediate Anesthesia Transfer of Care Note  Patient: Jeanette Aguirre  Procedure(s) Performed: COLONOSCOPY EGD (ESOPHAGOGASTRODUODENOSCOPY) DILATION, ESOPHAGUS  Patient Location: PACU and Short Stay  Anesthesia Type:General  Level of Consciousness: awake, alert , and oriented  Airway & Oxygen Therapy: Patient Spontanous Breathing  Post-op Assessment: Report given to RN and Post -op Vital signs reviewed and stable  Post vital signs: Reviewed and stable  Last Vitals:  Vitals Value Taken Time  BP 110/53 06/09/24 09:35  Temp 36.7 C 06/09/24 09:35  Pulse 75 06/09/24 09:35  Resp 16 06/09/24 09:35  SpO2 95 % 06/09/24 09:35    Last Pain:  Vitals:   06/09/24 0935  TempSrc: Oral  PainSc: 0-No pain         Complications: No notable events documented.

## 2024-06-09 NOTE — Op Note (Signed)
 [facility] Patient Name: Jeanette Aguirre Procedure Date: 06/09/2024 8:22 AM MRN: [id number] Date of Birth: Dec 04, 1960 Attending MD: Lamar Ozell Hollingshead , MD, [id number] CSN: [id number] Age: 63 Admit Type: Outpatient Procedure:                Colonoscopy Indications:              High risk colon cancer surveillance: Personal                            history of colonic polyps Providers:                Lamar Ozell Hollingshead, MD, Leandrew Edelman RN, RN,                            Dorcas Lenis, Technician Referring MD:              Medicines:                Propofol  per Anesthesia Complications:            No immediate complications. Estimated Blood Loss:     Estimated blood loss: none. Procedure:                Pre-Anesthesia Assessment:                           - Prior to the procedure, a History and Physical                            was performed, and patient medications and                            allergies were reviewed. The patient's tolerance of                            previous anesthesia was also reviewed. The risks                            and benefits of the procedure and the sedation                            options and risks were discussed with the patient.                            All questions were answered, and informed consent                            was obtained. Prior Anticoagulants: The patient has                            taken no anticoagulant or antiplatelet agents. ASA                            Grade Assessment: III - A patient with severe  systemic disease. After reviewing the risks and                            benefits, the patient was deemed in satisfactory                            condition to undergo the procedure.                           After obtaining informed consent, the colonoscope                            was passed under direct vision. Throughout the                            procedure, the  patient's blood pressure, pulse, and                            oxygen saturations were monitored continuously. The                            [id number] ([id number]) Colon was introduced through                            the anus and advanced to the the cecum, identified                            by appendiceal orifice and ileocecal valve. The                            colonoscopy was performed without difficulty. The                            patient tolerated the procedure well. The quality                            of the bowel preparation was adequate. The                            ileocecal valve, appendiceal orifice, and rectum                            were photographed. Scope In: 9:14:30 AM Scope Out: 9:31:12 AM Scope Withdrawal Time: 0 hours 6 minutes 48 seconds  Total Procedure Duration: 0 hours 16 minutes 42 seconds  Findings:      The perianal and digital rectal examinations were normal. Redundant and       elongated colon requiring strategic we applied external abdominal       pressure to reach the cecum.      The colon (entire examined portion) appeared normal.      The retroflexed view of the distal rectum and anal verge was normal and       showed no anal or rectal abnormalities. Impression:               -  The entire examined colon is normal (redundant).                           - The distal rectum and anal verge are normal on                            retroflexion view.                           - No specimens collected. Moderate Sedation:      Moderate (conscious) sedation was personally administered by an       anesthesia professional. The following parameters were monitored: oxygen       saturation, heart rate, blood pressure, respiratory rate, EKG, adequacy       of pulmonary ventilation, and response to care. Recommendation:           - Patient has a contact number available for                            emergencies. The signs and symptoms of potential                             delayed complications were discussed with the                            patient. Return to normal activities tomorrow.                            Written discharge instructions were provided to the                            patient.                           - Advance diet as tolerated.                           - Continue present medications.                           - Repeat colonoscopy in 7 years for surveillance.                           - Return to GI office in 6 weeks. See EGD report. Procedure Code(s):        --- Professional ---                           [phone], Colonoscopy, flexible; diagnostic, including                            collection of specimen(s) by brushing or washing,                            when performed (separate procedure) Diagnosis Code(s):        --- Professional ---  Z86.010, Personal history of colonic polyps CPT copyright 2022 American Medical Association. All rights reserved. The codes documented in this report are preliminary and upon coder review may  be revised to meet current compliance requirements. [name]. Almeda Ezra, MD Lamar Ozell Hollingshead, MD 06/09/2024 9:40:01 AM This report has been signed electronically. Number of Addenda: 0

## 2024-06-09 NOTE — H&P (Signed)
 @LOGO @   Gastroenterology Progress Note    Primary Care Physician:  Leavy Waddell NOVAK, FNP Primary Gastroenterologist:  Dr.   Pre-Procedure History & Physical: HPI:  Jeanette Aguirre is a 63 y.o. female here for  further evaluation of dysphagia via EGD.  Also, here for surveillance colonoscopy history of colonic polyps.  No change since she was seen in the office recently.  Past Medical History:  Diagnosis Date   Anxiety    Depression    Diabetes mellitus without complication (HCC)    Family history of adverse reaction to anesthesia    Gastroparesis    GERD (gastroesophageal reflux disease)    Hyperlipemia    Pneumonia    PONV (postoperative nausea and vomiting)     Past Surgical History:  Procedure Laterality Date   ADENOIDECTOMY     BIOPSY  08/19/2021   Procedure: BIOPSY;  Surgeon: Shaaron [name], MD;  Location: [facility];  Service: Endoscopy;;   CHOLECYSTECTOMY     COLONOSCOPY N/A 09/04/2017   Dr. Shaaron: multiple tubular adenomas removed, diverticulosis. next TCS in 3 years.    COLONOSCOPY N/A 01/18/2021   Procedure: COLONOSCOPY;  Surgeon: Shaaron [name], MD;  Location: [facility];  Service: Endoscopy;  Laterality: N/A;  AM (diabetic)   CYST REMOVAL HAND Left 04/01/2024   Procedure: LEFT MIDDLE FINGER MUCOID CYST EXCISION AND DEBRIDEMENT DISTAL INTERPHALANGEAL JOINT;  Surgeon: Murrell Drivers, MD;  Location: [facility];  Service: Orthopedics;  Laterality: Left;  Bier block   ESOPHAGOGASTRODUODENOSCOPY N/A 04/09/2018   Dr. shaaron: inflammation of esophagus c/w acid reflux.    ESOPHAGOGASTRODUODENOSCOPY (EGD) WITH PROPOFOL  N/A 08/19/2021   Procedure: ESOPHAGOGASTRODUODENOSCOPY (EGD) WITH PROPOFOL ;  Surgeon: Shaaron [name], MD;  Location: [facility];  Service: Endoscopy;  Laterality: N/A;  11:15am   LIPOMA EXCISION Right 03/07/2020   Procedure: EXCISION LIPOMA, THIGH;  Surgeon: Mavis Anes, MD;  Location: [facility];  Service: General;  Laterality: Right;    POLYPECTOMY  01/18/2021   Procedure: POLYPECTOMY;  Surgeon: Shaaron [name], MD;  Location: [facility];  Service: Endoscopy;;   TONSILLECTOMY     and adenoids   TUBAL LIGATION     TYMPANOSTOMY TUBE PLACEMENT      Prior to Admission medications   Medication Sig Start Date End Date Taking? Authorizing Provider  acarbose (PRECOSE) 50 MG tablet Take 50 mg by mouth 3 (three) times daily.   Yes [provider]  albuterol  (VENTOLIN  HFA) 108 (90 Base) MCG/ACT inhaler Inhale 1-2 puffs into the lungs every 6 (six) hours as needed for wheezing or shortness of breath. 01/20/24  Yes Chandra Harlene LABOR, NP  atorvastatin  (LIPITOR) 20 MG tablet Take 20 mg by mouth at bedtime.    Yes [provider]  azithromycin  (ZITHROMAX ) 250 MG tablet Take 1 tablet daily until finished. 04/04/24  Yes Idol, Julie, PA-C  Cholecalciferol (VITAMIN D3) 125 MCG (5000 UT) CAPS Take 1 capsule by mouth daily.   Yes [provider]  Cyanocobalamin  (B-12 COMPLIANCE INJECTION IJ) Inject as directed every 30 (thirty) days.   Yes [provider]  DULoxetine (CYMBALTA) 60 MG capsule Take 60 mg by mouth.   Yes [provider]  glimepiride  (AMARYL ) 4 MG tablet Take 4 mg by mouth 2 (two) times daily with breakfast and lunch.   Yes [provider]  HYDROcodone -acetaminophen  (NORCO/VICODIN) 5-325 MG tablet Take 1 tablet by mouth every 6 (six) hours as needed for moderate pain (pain score 4-6). 04/01/24  Yes Kuzma, Kevin, MD  JANUVIA 100 MG tablet Take 100 mg by mouth daily.   Yes [provider]  losartan (COZAAR) 25 MG tablet Take 1 tablet by mouth daily. 11/02/23  Yes [provider]  pantoprazole  (PROTONIX ) 40 MG tablet Take 1 tablet (40 mg total) by mouth 2 (two) times daily before a meal. For one month, then go back to once daily. 08/30/21  Yes Ezzard Sonny RAMAN, PA-C  JARDIANCE 25 MG TABS tablet Take 25 mg by mouth every morning. 11/12/20   [provider]   metFORMIN (GLUCOPHAGE) 1000 MG tablet Take 1,000 mg by mouth 2 (two) times daily.    [provider]  metoCLOPramide  (REGLAN ) 10 MG tablet Take 10 mg by mouth. Prn    [provider]    Allergies as of 05/02/2024 - Review Complete 04/22/2024  Allergen Reaction Noted   Lisinopril  Swelling 08/23/2018   Betadine  [povidone iodine ] Swelling 10/01/2016   Povidone-iodine  Swelling 10/01/2016   Ciprofloxacin Nausea Only 11/27/2017    Family History  Problem Relation Age of Onset   Diabetes Mother    Peripheral vascular disease Mother    Diabetes Father    Colitis Daughter 2       lymphocytic colitis   Colon cancer Neg Hx     Social History   Socioeconomic History   Marital status: Divorced    Spouse name: Not on file   Number of children: Not on file   Years of education: Not on file   Highest education level: Not on file  Occupational History   Not on file  Tobacco Use   Smoking status: Former    Current packs/day: 0.00    Average packs/day: 1 pack/day for 15.0 years (15.0 ttl pk-yrs)    Types: Cigarettes    Start date: 09/22/2001    Quit date: 09/22/2016    Years since quitting: 7.7   Smokeless tobacco: Never  Vaping Use   Vaping status: Never Used  Substance and Sexual Activity   Alcohol use: Yes    Comment: very little   Drug use: No   Sexual activity: Yes    Birth control/protection: Post-menopausal  Other Topics Concern   Not on file  Social History Narrative   Not on file   Social Drivers of Health   Financial Resource Strain: Not on file  Food Insecurity: Not on file  Transportation Needs: Not on file  Physical Activity: Not on file  Stress: Not on file  Social Connections: Not on file  Intimate Partner Violence: Not on file    Review of Systems   See HPI, otherwise negative ROS  Physical Exam: BP (!) 154/87   Pulse 85   Temp 99.2 F (37.3 C) (Oral)   Resp 15   Ht 5' 6 (1.676 m)   Wt 101.2 kg   LMP 03/03/2012   SpO2 94%    BMI 35.99 kg/m  General:   Alert,  Well-developed, well-nourished, pleasant and cooperative in NAD Skin:  Intact without significant lesions or rashes. Neck:  Supple; no masses or thyromegaly. No significant cervical adenopathy. Lungs:  Clear throughout to auscultation.   No wheezes, crackles, or rhonchi. No acute distress. Heart:  Regular rate and rhythm; no murmurs, clicks, rubs,  or gallops. Abdomen: Non-distended, normal bowel sounds.  Soft and nontender without appreciable mass or hepatosplenomegaly.    Impression/Plan:   63 year old lady with chronic GERD and dysphagia here for further evaluation and management via EGD  with esophageal  dilation as  feasible/appropriate per plan.  Also, here for surveillance colonoscopy given history of colonic polyps Risks benefits limitations alternatives imponderables have been reviewed questions answered all parties agreeable.    Notice: This dictation was prepared with Dragon dictation along with smaller phrase technology. Any transcriptional errors that result from this process are unintentional and may not be corrected upon review.

## 2024-06-09 NOTE — Anesthesia Preprocedure Evaluation (Signed)
 Anesthesia Evaluation  Patient identified by MRN, date of birth, ID band Patient awake    Reviewed: Allergy & Precautions, H&P , NPO status , Patient's Chart, lab work & pertinent test results, reviewed documented beta blocker date and time   History of Anesthesia Complications (+) PONV, Family history of anesthesia reaction and history of anesthetic complications  Airway Mallampati: II  TM Distance: >3 FB Neck ROM: full    Dental no notable dental hx.    Pulmonary pneumonia, former smoker   Pulmonary exam normal breath sounds clear to auscultation       Cardiovascular Exercise Tolerance: Good hypertension, negative cardio ROS  Rhythm:regular Rate:Normal     Neuro/Psych  PSYCHIATRIC DISORDERS Anxiety Depression    negative neurological ROS     GI/Hepatic Neg liver ROS,GERD  ,,  Endo/Other  diabetes    Renal/GU negative Renal ROS  negative genitourinary   Musculoskeletal   Abdominal   Peds  Hematology  (+) Blood dyscrasia, anemia   Anesthesia Other Findings   Reproductive/Obstetrics negative OB ROS                              Anesthesia Physical Anesthesia Plan  ASA: 2  Anesthesia Plan: General   Post-op Pain Management:    Induction:   PONV Risk Score and Plan: Propofol  infusion  Airway Management Planned:   Additional Equipment:   Intra-op Plan:   Post-operative Plan:   Informed Consent: I have reviewed the patients History and Physical, chart, labs and discussed the procedure including the risks, benefits and alternatives for the proposed anesthesia with the patient or authorized representative who has indicated his/her understanding and acceptance.     Dental Advisory Given  Plan Discussed with: CRNA  Anesthesia Plan Comments:         Anesthesia Quick Evaluation

## 2024-06-10 ENCOUNTER — Encounter (HOSPITAL_COMMUNITY): Payer: Self-pay | Admitting: Internal Medicine

## 2024-06-14 ENCOUNTER — Other Ambulatory Visit: Payer: Self-pay | Admitting: Family Medicine

## 2024-06-14 ENCOUNTER — Ambulatory Visit
Admission: RE | Admit: 2024-06-14 | Discharge: 2024-06-14 | Disposition: A | Discharge status: Home / Self Care | Source: Ambulatory Visit | Attending: Family Medicine | Admitting: Family Medicine

## 2024-06-14 DIAGNOSIS — Z1231 Encounter for screening mammogram for malignant neoplasm of breast: Secondary | ICD-10-CM

## 2024-06-17 NOTE — Anesthesia Postprocedure Evaluation (Signed)
 Anesthesia Post Note  Patient: Jeanette Aguirre  Procedure(s) Performed: COLONOSCOPY EGD (ESOPHAGOGASTRODUODENOSCOPY) DILATION, ESOPHAGUS  Patient location during evaluation: Phase II Anesthesia Type: General Level of consciousness: awake Pain management: pain level controlled Vital Signs Assessment: post-procedure vital signs reviewed and stable Respiratory status: spontaneous breathing and respiratory function stable Cardiovascular status: blood pressure returned to baseline and stable Postop Assessment: no headache and no apparent nausea or vomiting Anesthetic complications: no Comments: Late entry   No notable events documented.   Last Vitals:  Vitals:   06/09/24 0728 06/09/24 0935  BP: (!) 154/87 (!) 110/53  Pulse: 85 75  Resp: 15 16  Temp: 37.3 C 36.7 C  SpO2: 94% 95%    Last Pain:  Vitals:   06/09/24 0935  TempSrc: Oral  PainSc: 0-No pain                 Yvonna JINNY Bosworth

## 2024-06-24 ENCOUNTER — Ambulatory Visit (INDEPENDENT_AMBULATORY_CARE_PROVIDER_SITE_OTHER): Admitting: Otolaryngology

## 2024-07-18 ENCOUNTER — Encounter: Payer: Self-pay | Admitting: Internal Medicine

## 2024-07-20 ENCOUNTER — Ambulatory Visit (INDEPENDENT_AMBULATORY_CARE_PROVIDER_SITE_OTHER): Admitting: Gastroenterology

## 2024-07-20 ENCOUNTER — Encounter: Payer: Self-pay | Admitting: Gastroenterology

## 2024-07-20 VITALS — BP 109/64 | HR 82 | Temp 98.6°F | Ht 66.0 in | Wt 223.6 lb

## 2024-07-20 DIAGNOSIS — K219 Gastro-esophageal reflux disease without esophagitis: Secondary | ICD-10-CM | POA: Diagnosis not present

## 2024-07-20 DIAGNOSIS — D509 Iron deficiency anemia, unspecified: Secondary | ICD-10-CM

## 2024-07-20 DIAGNOSIS — R112 Nausea with vomiting, unspecified: Secondary | ICD-10-CM

## 2024-07-20 DIAGNOSIS — R160 Hepatomegaly, not elsewhere classified: Secondary | ICD-10-CM | POA: Diagnosis not present

## 2024-07-20 DIAGNOSIS — K760 Fatty (change of) liver, not elsewhere classified: Secondary | ICD-10-CM

## 2024-07-20 DIAGNOSIS — E538 Deficiency of other specified B group vitamins: Secondary | ICD-10-CM

## 2024-07-20 NOTE — Patient Instructions (Signed)
 We will be in touch closer to 09/2024 to schedule ultrasound of your abdomen and your labs.   Consider adding over the counter vitamin B12 1000mcg daily. This should help maintain your levels.   Continue pantoprazole  40mg  daily.   Reach out with questions or concerns.

## 2024-07-20 NOTE — Progress Notes (Unsigned)
 GI Office Note    Referring Provider: Leavy Waddell NOVAK, FNP Primary Care Physician:  Leavy Waddell NOVAK, FNP  Primary Gastroenterologist:  Chief Complaint   Chief Complaint  Patient presents with   Follow-up    Follow up after procedure and labs    History of Present Illness   Jeanette Aguirre is a 63 y.o. female presenting today for follow up. Last seen in the office in June 2025.  H/o slighlty delayed gastric emptying, fatty liver, IDA, B12 def, GERD, positive Alpha Gal.   Seen at [facility] 11/2021 for episodic N/V. Delayed GES but discrete episodes raise suspicious for cyclic vomiting syndrom (and often overlap with delayed GES). Advised to aovid fatty foods and insoluble fiber. Avoid acidic, spicy foods, carbonated beverages, etoh and smoking. Optimize glycemic control. Avoid incretin-based therapies and GLP1 agonists. Stop NSAIDs.    Aqufor (iron coated) B12 shots, fusco retired.     Wt Readings from Last 10 Encounters:  07/20/24 223 lb 9.6 oz (101.4 kg)  06/09/24 223 lb (101.2 kg)  04/22/24 223 lb (101.2 kg)  04/03/24 221 lb (100.2 kg)  04/01/24 222 lb 0.1 oz (100.7 kg)  03/07/24 224 lb 12.8 oz (102 kg)  01/23/24 230 lb (104.3 kg)  10/04/21 248 lb 6.4 oz (112.7 kg)  09/25/21 243 lb 6.4 oz (110.4 kg)  08/19/21 250 lb (113.4 kg)    Prior Data   Colonoscopy 05/2024: -entire examined colon normal (redundant) -distal rectum and anal verge normal -repeat colonoscopy 7 years  EGD 05/2024: -normal esophagus s/p dilation -small hh -normal duodenal bulb and second portion of duodenum     Medications   Current Outpatient Medications  Medication Sig Dispense Refill   acarbose (PRECOSE) 50 MG tablet Take 50 mg by mouth 3 (three) times daily. (Patient taking differently: Take 50 mg by mouth 3 (three) times daily. Pt takes PRN)     albuterol  (VENTOLIN  HFA) 108 (90 Base) MCG/ACT inhaler Inhale 1-2 puffs into the lungs every 6 (six) hours as needed for wheezing or  shortness of breath. 6.7 g 0   atorvastatin  (LIPITOR) 20 MG tablet Take 20 mg by mouth at bedtime.      Cholecalciferol (VITAMIN D3) 125 MCG (5000 UT) CAPS Take 1 capsule by mouth daily.     DULoxetine (CYMBALTA) 60 MG capsule Take 60 mg by mouth.     glimepiride  (AMARYL ) 4 MG tablet Take 4 mg by mouth 2 (two) times daily with breakfast and lunch.     JANUVIA 100 MG tablet Take 100 mg by mouth daily.     JARDIANCE 25 MG TABS tablet Take 25 mg by mouth every morning.     losartan (COZAAR) 25 MG tablet Take 1 tablet by mouth daily.     metFORMIN (GLUCOPHAGE) 1000 MG tablet Take 1,000 mg by mouth 2 (two) times daily.     metoCLOPramide  (REGLAN ) 10 MG tablet Take 10 mg by mouth. Prn     pantoprazole  (PROTONIX ) 40 MG tablet Take 1 tablet (40 mg total) by mouth 2 (two) times daily before a meal. For one month, then go back to once daily. 60 tablet 3   No current facility-administered medications for this visit.    Allergies   Allergies as of 07/20/2024 - Review Complete 07/20/2024  Allergen Reaction Noted   Lisinopril  Swelling 08/23/2018   Betadine  [povidone iodine ] Swelling 10/01/2016   Povidone-iodine  Swelling 10/01/2016   Ciprofloxacin Nausea Only 11/27/2017     Past Medical History  Past Medical History:  Diagnosis Date   Anxiety    Depression    Diabetes mellitus without complication (HCC)    Family history of adverse reaction to anesthesia    Gastroparesis    GERD (gastroesophageal reflux disease)    Hyperlipemia    Pneumonia    PONV (postoperative nausea and vomiting)     Past Surgical History   Past Surgical History:  Procedure Laterality Date   ADENOIDECTOMY     BIOPSY  08/19/2021   Procedure: BIOPSY;  Surgeon: Shaaron [name], MD;  Location: [facility];  Service: Endoscopy;;   CHOLECYSTECTOMY     COLONOSCOPY N/A 09/04/2017   Dr. Shaaron: multiple tubular adenomas removed, diverticulosis. next TCS in 3 years.    COLONOSCOPY N/A 01/18/2021   Procedure:  COLONOSCOPY;  Surgeon: Shaaron [name], MD;  Location: [facility];  Service: Endoscopy;  Laterality: N/A;  AM (diabetic)   COLONOSCOPY N/A 06/09/2024   Procedure: COLONOSCOPY;  Surgeon: Shaaron [name], MD;  Location: [facility];  Service: Endoscopy;  Laterality: N/A;  830AM, ASA 2   CYST REMOVAL HAND Left 04/01/2024   Procedure: LEFT MIDDLE FINGER MUCOID CYST EXCISION AND DEBRIDEMENT DISTAL INTERPHALANGEAL JOINT;  Surgeon: Murrell Drivers, MD;  Location: [facility];  Service: Orthopedics;  Laterality: Left;  Bier block   ESOPHAGEAL DILATION N/A 06/09/2024   Procedure: DILATION, ESOPHAGUS;  Surgeon: Shaaron [name], MD;  Location: [facility];  Service: Endoscopy;  Laterality: N/A;   ESOPHAGOGASTRODUODENOSCOPY N/A 04/09/2018   Dr. shaaron: inflammation of esophagus c/w acid reflux.    ESOPHAGOGASTRODUODENOSCOPY N/A 06/09/2024   Procedure: EGD (ESOPHAGOGASTRODUODENOSCOPY);  Surgeon: Shaaron [name], MD;  Location: [facility];  Service: Endoscopy;  Laterality: N/A;   ESOPHAGOGASTRODUODENOSCOPY (EGD) WITH PROPOFOL  N/A 08/19/2021   Procedure: ESOPHAGOGASTRODUODENOSCOPY (EGD) WITH PROPOFOL ;  Surgeon: Shaaron [name], MD;  Location: [facility];  Service: Endoscopy;  Laterality: N/A;  11:15am   LIPOMA EXCISION Right 03/07/2020   Procedure: EXCISION LIPOMA, THIGH;  Surgeon: Mavis Anes, MD;  Location: [facility];  Service: General;  Laterality: Right;   POLYPECTOMY  01/18/2021   Procedure: POLYPECTOMY;  Surgeon: Shaaron [name], MD;  Location: [facility];  Service: Endoscopy;;   TONSILLECTOMY     and adenoids   TUBAL LIGATION     TYMPANOSTOMY TUBE PLACEMENT      Past Family History   Family History  Problem Relation Age of Onset   Diabetes Mother    Peripheral vascular disease Mother    Diabetes Father    Colitis Daughter 53       lymphocytic colitis   Colon cancer Neg Hx     Past Social History   Social History   Socioeconomic History   Marital status: Divorced     Spouse name: Not on file   Number of children: Not on file   Years of education: Not on file   Highest education level: Not on file  Occupational History   Not on file  Tobacco Use   Smoking status: Former    Current packs/day: 0.00    Average packs/day: 1 pack/day for 15.0 years (15.0 ttl pk-yrs)    Types: Cigarettes    Start date: 09/22/2001    Quit date: 09/22/2016    Years since quitting: 7.8   Smokeless tobacco: Never  Vaping Use   Vaping status: Never Used  Substance and Sexual Activity   Alcohol use: Yes    Comment: very little  Drug use: No   Sexual activity: Yes    Birth control/protection: Post-menopausal  Other Topics Concern   Not on file  Social History Narrative   Not on file   Social Drivers of Health   Financial Resource Strain: Not on file  Food Insecurity: Not on file  Transportation Needs: Not on file  Physical Activity: Not on file  Stress: Not on file  Social Connections: Not on file  Intimate Partner Violence: Not on file    Review of Systems   General: Negative for anorexia, weight loss, fever, chills, fatigue, weakness. ENT: Negative for hoarseness, difficulty swallowing , nasal congestion. CV: Negative for chest pain, angina, palpitations, dyspnea on exertion, peripheral edema.  Respiratory: Negative for dyspnea at rest, dyspnea on exertion, cough, sputum, wheezing.  GI: See history of present illness. GU:  Negative for dysuria, hematuria, urinary incontinence, urinary frequency, nocturnal urination.  Endo: Negative for unusual weight change.     Physical Exam   BP 109/64   Pulse 82   Temp 98.6 F (37 C)   Ht 5' 6 (1.676 m)   Wt 223 lb 9.6 oz (101.4 kg)   LMP 03/03/2012   BMI 36.09 kg/m    General: Well-nourished, well-developed in no acute distress.  Eyes: No icterus. Mouth: Oropharyngeal mucosa moist and pink   Lungs: Clear to auscultation bilaterally.  Heart: Regular rate and rhythm, no murmurs rubs or gallops.  Abdomen:  Bowel sounds are normal, nontender, nondistended, no hepatosplenomegaly or masses,  no abdominal bruits or hernia , no rebound or guarding.  Rectal: not performed Extremities: No lower extremity edema. No clubbing or deformities. Neuro: Alert and oriented x 4   Skin: Warm and dry, no jaundice.   Psych: Alert and cooperative, normal mood and affect.  Labs   *** Imaging Studies   No results found.  Assessment/Plan:     H/O adenomatous colon polyps: -due surveillance colonoscopy. Patient requesting pill prep due to difficult with trilyte last time in setting of gastroparesis -ASA 2.  I have discussed the risks, alternatives, benefits with regards to but not limited to the risk of reaction to medication, bleeding, infection, perforation and the patient is agreeable to proceed. Written consent to be obtained.   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 ASA 2.  I have discussed the risks, alternatives, benefits with regards to but not limited to the risk of reaction to medication, bleeding, infection, perforation and the patient is agreeable to proceed. Written consent to be obtained. -patient has pending h.pylori breath test ordered by pcp when her reflux flared recently. She will hold off for now. Could perform gastric biopsy at time of egd if needed.        B12 supplemtent*** If ida persistets then consider capsule. EGD/TCS make sure going to [facility]. Pantoprazole  mostly once per day. Estrogen low dose patch, once per  week. Labs jan 26 ELF    Sonny S. Ezzard, MHS, PA-C [facility]

## 2024-10-19 ENCOUNTER — Other Ambulatory Visit: Payer: Self-pay

## 2024-10-19 ENCOUNTER — Other Ambulatory Visit: Payer: Self-pay | Admitting: *Deleted

## 2024-10-19 DIAGNOSIS — K760 Fatty (change of) liver, not elsewhere classified: Secondary | ICD-10-CM

## 2024-10-19 DIAGNOSIS — E538 Deficiency of other specified B group vitamins: Secondary | ICD-10-CM

## 2024-10-19 DIAGNOSIS — D509 Iron deficiency anemia, unspecified: Secondary | ICD-10-CM

## 2024-10-19 NOTE — Telephone Encounter (Signed)
 Labs have been ordered. Pt has read [facility].

## 2024-10-19 NOTE — Telephone Encounter (Signed)
 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

## 2024-10-21 ENCOUNTER — Ambulatory Visit: Payer: Self-pay | Admitting: Gastroenterology

## 2024-10-21 ENCOUNTER — Ambulatory Visit (HOSPITAL_COMMUNITY)
Admission: RE | Admit: 2024-10-21 | Discharge: 2024-10-21 | Disposition: A | Source: Ambulatory Visit | Attending: Gastroenterology | Admitting: Gastroenterology

## 2024-10-21 DIAGNOSIS — K760 Fatty (change of) liver, not elsewhere classified: Secondary | ICD-10-CM | POA: Diagnosis present

## 2024-10-25 LAB — CBC WITH DIFFERENTIAL/PLATELET
Basophils Absolute: 0 10*3/uL (ref 0.0–0.2)
Basos: 1 %
EOS (ABSOLUTE): 0.4 10*3/uL (ref 0.0–0.4)
Eos: 4 %
Hematocrit: 38.1 % (ref 34.0–46.6)
Hemoglobin: 12 g/dL (ref 11.1–15.9)
Immature Grans (Abs): 0 10*3/uL (ref 0.0–0.1)
Immature Granulocytes: 0 %
Lymphocytes Absolute: 3.2 10*3/uL — ABNORMAL HIGH (ref 0.7–3.1)
Lymphs: 38 %
MCH: 25.3 pg — ABNORMAL LOW (ref 26.6–33.0)
MCHC: 31.5 g/dL (ref 31.5–35.7)
MCV: 80 fL (ref 79–97)
Monocytes Absolute: 0.3 10*3/uL (ref 0.1–0.9)
Monocytes: 4 %
Neutrophils Absolute: 4.4 10*3/uL (ref 1.4–7.0)
Neutrophils: 53 %
Platelets: 349 10*3/uL (ref 150–450)
RBC: 4.75 x10E6/uL (ref 3.77–5.28)
RDW: 15.7 % — ABNORMAL HIGH (ref 11.7–15.4)
WBC: 8.3 10*3/uL (ref 3.4–10.8)

## 2024-10-25 LAB — ENHANCED LIVER FIBROSIS (ELF): ELF(TM) Score: 8.81

## 2024-10-25 LAB — IRON,TIBC AND FERRITIN PANEL
Ferritin: 16 ng/mL (ref 15–150)
Iron Saturation: 11 % — ABNORMAL LOW (ref 15–55)
Iron: 46 ug/dL (ref 27–139)
Total Iron Binding Capacity: 405 ug/dL (ref 250–450)
UIBC: 359 ug/dL (ref 118–369)

## 2024-10-25 LAB — COMPREHENSIVE METABOLIC PANEL WITH GFR
ALT: 14 [IU]/L (ref 0–32)
AST: 15 [IU]/L (ref 0–40)
Albumin: 4.5 g/dL (ref 3.9–4.9)
Alkaline Phosphatase: 121 [IU]/L (ref 49–135)
BUN/Creatinine Ratio: 9 — ABNORMAL LOW (ref 12–28)
BUN: 7 mg/dL — ABNORMAL LOW (ref 8–27)
Bilirubin Total: 0.4 mg/dL (ref 0.0–1.2)
CO2: 19 mmol/L — ABNORMAL LOW (ref 20–29)
Calcium: 9.9 mg/dL (ref 8.7–10.3)
Chloride: 98 mmol/L (ref 96–106)
Creatinine, Ser: 0.76 mg/dL (ref 0.57–1.00)
Globulin, Total: 2.3 g/dL (ref 1.5–4.5)
Glucose: 261 mg/dL — ABNORMAL HIGH (ref 70–99)
Potassium: 3.7 mmol/L (ref 3.5–5.2)
Sodium: 139 mmol/L (ref 134–144)
Total Protein: 6.8 g/dL (ref 6.0–8.5)
eGFR: 89 mL/min/{1.73_m2}

## 2024-10-25 LAB — MAGNESIUM: Magnesium: 1.3 mg/dL — ABNORMAL LOW (ref 1.6–2.3)

## 2024-10-25 LAB — VITAMIN B12: Vitamin B-12: 266 pg/mL (ref 232–1245)

## 2024-10-28 ENCOUNTER — Ambulatory Visit: Payer: Self-pay | Admitting: Gastroenterology

## 2024-10-28 MED ORDER — MAGNESIUM OXIDE -MG SUPPLEMENT 400 (240 MG) MG PO TABS: 400.0000 mg | ORAL_TABLET | Freq: Two times a day (BID) | ORAL | 2 refills | Status: AC

## 2024-12-26 ENCOUNTER — Ambulatory Visit: Admitting: Gastroenterology
